# Patient Record
Sex: Female | Born: 1950
Health system: Southern US, Community
[De-identification: ages and names within clinical notes are randomized; demographics above are authoritative.]

## PROBLEM LIST (undated history)

## (undated) DIAGNOSIS — Z87442 Personal history of urinary calculi: Secondary | ICD-10-CM

## (undated) DIAGNOSIS — T8859XA Other complications of anesthesia, initial encounter: Secondary | ICD-10-CM

## (undated) DIAGNOSIS — N200 Calculus of kidney: Secondary | ICD-10-CM

## (undated) DIAGNOSIS — R002 Palpitations: Secondary | ICD-10-CM

## (undated) DIAGNOSIS — K862 Cyst of pancreas: Secondary | ICD-10-CM

## (undated) DIAGNOSIS — C801 Malignant (primary) neoplasm, unspecified: Secondary | ICD-10-CM

## (undated) DIAGNOSIS — F419 Anxiety disorder, unspecified: Secondary | ICD-10-CM

## (undated) DIAGNOSIS — K219 Gastro-esophageal reflux disease without esophagitis: Secondary | ICD-10-CM

## (undated) DIAGNOSIS — T4145XA Adverse effect of unspecified anesthetic, initial encounter: Secondary | ICD-10-CM

## (undated) DIAGNOSIS — E119 Type 2 diabetes mellitus without complications: Secondary | ICD-10-CM

## (undated) DIAGNOSIS — M1990 Unspecified osteoarthritis, unspecified site: Secondary | ICD-10-CM

## (undated) HISTORY — PX: CARDIAC CATHETERIZATION: SHX172

## (undated) HISTORY — PX: EYE SURGERY: SHX253

## (undated) HISTORY — PX: TOTAL ABDOMINAL HYSTERECTOMY W/ BILATERAL SALPINGOOPHORECTOMY: SHX83

## (undated) HISTORY — DX: Cyst of pancreas: K86.2

## (undated) HISTORY — DX: Calculus of kidney: N20.0

## (undated) HISTORY — DX: Type 2 diabetes mellitus without complications: E11.9

## (undated) HISTORY — PX: CORONARY ANGIOPLASTY: SHX604

## (undated) HISTORY — PX: ABDOMINAL HYSTERECTOMY: SHX81

---
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
## 2000-10-29 ENCOUNTER — Other Ambulatory Visit: Admission: RE | Admit: 2000-10-29 | Discharge: 2000-10-29 | Payer: Self-pay | Admitting: Family Medicine

## 2004-11-23 ENCOUNTER — Ambulatory Visit: Payer: Self-pay | Admitting: Gastroenterology

## 2006-04-05 ENCOUNTER — Ambulatory Visit: Payer: Self-pay | Admitting: Family Medicine

## 2008-02-09 ENCOUNTER — Emergency Department: Payer: Self-pay | Admitting: Emergency Medicine

## 2009-01-06 ENCOUNTER — Ambulatory Visit: Payer: Self-pay | Admitting: Urology

## 2010-01-18 ENCOUNTER — Ambulatory Visit: Payer: Self-pay | Admitting: Urology

## 2010-02-06 ENCOUNTER — Ambulatory Visit: Payer: Self-pay | Admitting: Unknown Physician Specialty

## 2010-02-09 ENCOUNTER — Ambulatory Visit: Payer: Self-pay | Admitting: Unknown Physician Specialty

## 2011-05-11 ENCOUNTER — Ambulatory Visit: Payer: Self-pay | Admitting: Urology

## 2011-10-22 ENCOUNTER — Ambulatory Visit: Payer: Self-pay | Admitting: Family Medicine

## 2013-08-21 ENCOUNTER — Ambulatory Visit: Payer: Self-pay | Admitting: Internal Medicine

## 2014-07-05 DIAGNOSIS — F411 Generalized anxiety disorder: Secondary | ICD-10-CM | POA: Insufficient documentation

## 2014-07-20 ENCOUNTER — Ambulatory Visit: Payer: Self-pay | Admitting: Internal Medicine

## 2014-08-13 ENCOUNTER — Ambulatory Visit
Admit: 2014-08-13 | Disposition: A | Discharge status: Home / Self Care | Payer: Self-pay | Attending: Ophthalmology | Admitting: Ophthalmology

## 2014-08-17 ENCOUNTER — Ambulatory Visit
Admit: 2014-08-17 | Disposition: A | Discharge status: Home / Self Care | Payer: Self-pay | Attending: Ophthalmology | Admitting: Ophthalmology

## 2014-08-22 NOTE — Op Note (Signed)
PATIENT NAME:  Tammy Guerra, Tammy Guerra MR#:  [id number] DATE OF BIRTH:  02/14/1951  DATE OF PROCEDURE:  08/17/2014  PREOPERATIVE DIAGNOSIS:  Nuclear sclerotic cataract of the right eye.   POSTOPERATIVE DIAGNOSIS:  Nuclear sclerotic cataract of the right eye.   OPERATIVE PROCEDURE:  Cataract extraction by phacoemulsification with implant of intraocular lens to right eye.   SURGEON:  Birder Robson, MD.   ANESTHESIA:  1. Managed anesthesia care.  2. Topical tetracaine drops followed by 2% Xylocaine jelly applied in the preoperative holding area.   COMPLICATIONS:  None.   TECHNIQUE:  Stop and chop.   DESCRIPTION OF PROCEDURE:  The patient was examined and consented in the preoperative holding area where the aforementioned topical anesthesia was applied to the right eye and then brought back to the Operating Room where the right eye was prepped and draped in the usual sterile ophthalmic fashion and a lid speculum was placed. A paracentesis was created with the side port blade and the anterior chamber was filled with viscoelastic. A near clear corneal incision was performed with the steel keratome. A continuous curvilinear capsulorrhexis was performed with a cystotome followed by the capsulorrhexis forceps. Hydrodissection and hydrodelineation were carried out with BSS on a blunt cannula. The lens was removed in a stop and chop technique and the remaining cortical material was removed with the irrigation-aspiration handpiece. The capsular bag was inflated with viscoelastic and the Tecnis ZCB00 23.5-diopter lens, serial number [id number] was placed in the capsular bag without complication. The remaining viscoelastic was removed from the eye with the irrigation-aspiration handpiece. The wounds were hydrated. The anterior chamber was flushed with Miostat and the eye was inflated to physiologic pressure. 0.1 mL of cefuroxime concentration 10 mg/mL was placed in the anterior chamber. The wounds were found to be  water tight. The eye was dressed with Vigamox. The patient was given protective glasses to wear throughout the day and a shield with which to sleep tonight. The patient was also given drops with which to begin a drop regimen today and will follow-up with me in one day.    ____________________________ Livingston Diones. Timmia Cogburn, MD wlp:JT D: 08/17/2014 20:58:00 ET T: 08/18/2014 09:13:42 ET JOB#: [id number]  cc: Tammy Mccollister L. Jamisen Duerson, MD, <Dictator> Livingston Diones Lilybeth Vien MD ELECTRONICALLY SIGNED 08/18/2014 12:05

## 2014-09-02 ENCOUNTER — Encounter: Payer: Self-pay | Admitting: *Deleted

## 2014-09-02 DIAGNOSIS — Z79899 Other long term (current) drug therapy: Secondary | ICD-10-CM | POA: Diagnosis not present

## 2014-09-02 DIAGNOSIS — Z9841 Cataract extraction status, right eye: Secondary | ICD-10-CM | POA: Diagnosis not present

## 2014-09-02 DIAGNOSIS — M1990 Unspecified osteoarthritis, unspecified site: Secondary | ICD-10-CM | POA: Diagnosis not present

## 2014-09-02 DIAGNOSIS — Z9862 Peripheral vascular angioplasty status: Secondary | ICD-10-CM | POA: Diagnosis not present

## 2014-09-02 DIAGNOSIS — Z9889 Other specified postprocedural states: Secondary | ICD-10-CM | POA: Diagnosis not present

## 2014-09-02 DIAGNOSIS — F17200 Nicotine dependence, unspecified, uncomplicated: Secondary | ICD-10-CM | POA: Diagnosis not present

## 2014-09-02 DIAGNOSIS — H2512 Age-related nuclear cataract, left eye: Secondary | ICD-10-CM | POA: Diagnosis not present

## 2014-09-02 DIAGNOSIS — Z85828 Personal history of other malignant neoplasm of skin: Secondary | ICD-10-CM | POA: Diagnosis not present

## 2014-09-02 DIAGNOSIS — F419 Anxiety disorder, unspecified: Secondary | ICD-10-CM | POA: Diagnosis not present

## 2014-09-02 DIAGNOSIS — H269 Unspecified cataract: Secondary | ICD-10-CM | POA: Diagnosis present

## 2014-09-07 ENCOUNTER — Encounter: Payer: Self-pay | Admitting: *Deleted

## 2014-09-07 ENCOUNTER — Ambulatory Visit
Admission: RE | Admit: 2014-09-07 | Discharge: 2014-09-07 | Disposition: A | Discharge status: Home / Self Care | Payer: 59 | Source: Ambulatory Visit | Attending: Ophthalmology | Admitting: Ophthalmology

## 2014-09-07 ENCOUNTER — Ambulatory Visit: Payer: 59 | Admitting: Anesthesiology

## 2014-09-07 ENCOUNTER — Encounter
Admission: RE | Disposition: A | Discharge status: Home / Self Care | Payer: Self-pay | Source: Ambulatory Visit | Attending: Ophthalmology

## 2014-09-07 DIAGNOSIS — Z79899 Other long term (current) drug therapy: Secondary | ICD-10-CM | POA: Insufficient documentation

## 2014-09-07 DIAGNOSIS — Z9841 Cataract extraction status, right eye: Secondary | ICD-10-CM | POA: Insufficient documentation

## 2014-09-07 DIAGNOSIS — H2512 Age-related nuclear cataract, left eye: Secondary | ICD-10-CM | POA: Insufficient documentation

## 2014-09-07 DIAGNOSIS — Z9862 Peripheral vascular angioplasty status: Secondary | ICD-10-CM | POA: Insufficient documentation

## 2014-09-07 DIAGNOSIS — M1990 Unspecified osteoarthritis, unspecified site: Secondary | ICD-10-CM | POA: Insufficient documentation

## 2014-09-07 DIAGNOSIS — F17200 Nicotine dependence, unspecified, uncomplicated: Secondary | ICD-10-CM | POA: Insufficient documentation

## 2014-09-07 DIAGNOSIS — F419 Anxiety disorder, unspecified: Secondary | ICD-10-CM | POA: Insufficient documentation

## 2014-09-07 DIAGNOSIS — Z85828 Personal history of other malignant neoplasm of skin: Secondary | ICD-10-CM | POA: Insufficient documentation

## 2014-09-07 DIAGNOSIS — Z9889 Other specified postprocedural states: Secondary | ICD-10-CM | POA: Insufficient documentation

## 2014-09-07 HISTORY — DX: Unspecified osteoarthritis, unspecified site: M19.90

## 2014-09-07 HISTORY — DX: Palpitations: R00.2

## 2014-09-07 HISTORY — PX: CATARACT EXTRACTION W/PHACO: SHX586

## 2014-09-07 HISTORY — DX: Other complications of anesthesia, initial encounter: T88.59XA

## 2014-09-07 HISTORY — DX: Gastro-esophageal reflux disease without esophagitis: K21.9

## 2014-09-07 HISTORY — DX: Anxiety disorder, unspecified: F41.9

## 2014-09-07 HISTORY — DX: Malignant (primary) neoplasm, unspecified: C80.1

## 2014-09-07 HISTORY — DX: Adverse effect of unspecified anesthetic, initial encounter: T41.45XA

## 2014-09-07 SURGERY — PHACOEMULSIFICATION, CATARACT, WITH IOL INSERTION
Anesthesia: Monitor Anesthesia Care | Laterality: Left

## 2014-09-07 MED ORDER — EPINEPHRINE HCL 1 MG/ML IJ SOLN
INTRAMUSCULAR | Status: AC
  Filled 2014-09-07: qty 2

## 2014-09-07 MED ORDER — NA CHONDROIT SULF-NA HYALURON 40-17 MG/ML IO SOLN
INTRAOCULAR | Status: AC
  Filled 2014-09-07: qty 1

## 2014-09-07 MED ORDER — ARMC OPHTHALMIC DILATING GEL
1.0000 "application " | OPHTHALMIC | Status: DC | PRN
  Administered 2014-09-07: 1 via OPHTHALMIC

## 2014-09-07 MED ORDER — LIDOCAINE HCL (PF) 4 % IJ SOLN
INTRAMUSCULAR | Status: AC
  Filled 2014-09-07: qty 5

## 2014-09-07 MED ORDER — CEFUROXIME OPHTHALMIC INJECTION 1 MG/0.1 ML
INJECTION | OPHTHALMIC | Status: DC | PRN
  Administered 2014-09-07: 0.1 mL via INTRACAMERAL

## 2014-09-07 MED ORDER — TETRACAINE HCL 0.5 % OP SOLN
1.0000 [drp] | OPHTHALMIC | Status: AC | PRN
  Administered 2014-09-07: 1 [drp] via OPHTHALMIC

## 2014-09-07 MED ORDER — MIDAZOLAM HCL 2 MG/2ML IJ SOLN
INTRAMUSCULAR | Status: DC | PRN
  Administered 2014-09-07: 2 mg via INTRAVENOUS

## 2014-09-07 MED ORDER — CEFUROXIME OPHTHALMIC INJECTION 1 MG/0.1 ML
INJECTION | OPHTHALMIC | Status: AC
  Filled 2014-09-07: qty 0.1

## 2014-09-07 MED ORDER — SODIUM CHLORIDE 0.9 % IV SOLN
INTRAVENOUS | Status: DC
  Administered 2014-09-07: 10:00:00 via INTRAVENOUS

## 2014-09-07 MED ORDER — MOXIFLOXACIN HCL 0.5 % OP SOLN
OPHTHALMIC | Status: AC
  Filled 2014-09-07: qty 3

## 2014-09-07 MED ORDER — POVIDONE-IODINE 5 % OP SOLN
OPHTHALMIC | Status: AC
  Administered 2014-09-07: 1 via OPHTHALMIC
  Filled 2014-09-07: qty 30

## 2014-09-07 MED ORDER — EPINEPHRINE HCL 1 MG/ML IJ SOLN
INTRAMUSCULAR | Status: DC | PRN
  Administered 2014-09-07: .4 mL via OPHTHALMIC

## 2014-09-07 MED ORDER — MOXIFLOXACIN HCL 0.5 % OP SOLN - NO CHARGE
OPHTHALMIC | Status: DC | PRN
Start: 2014-09-07 — End: 2014-09-07
  Administered 2014-09-07: 1 [drp] via OPHTHALMIC

## 2014-09-07 MED ORDER — POVIDONE-IODINE 5 % OP SOLN
1.0000 "application " | OPHTHALMIC | Status: AC | PRN
  Administered 2014-09-07: 1 via OPHTHALMIC

## 2014-09-07 MED ORDER — TETRACAINE HCL 0.5 % OP SOLN
OPHTHALMIC | Status: AC
  Administered 2014-09-07: 1 [drp] via OPHTHALMIC
  Filled 2014-09-07: qty 2

## 2014-09-07 MED ORDER — ARMC OPHTHALMIC DILATING GEL
OPHTHALMIC | Status: AC
  Filled 2014-09-07: qty 0.25

## 2014-09-07 MED ORDER — BSS IO SOLN
INTRAOCULAR | Status: DC | PRN
  Administered 2014-09-07: 200 mL

## 2014-09-07 MED ORDER — CARBACHOL 0.01 % IO SOLN
INTRAOCULAR | Status: DC | PRN
  Administered 2014-09-07: 0.5 mL via INTRAOCULAR

## 2014-09-07 SURGICAL SUPPLY — 24 items
ACTIVE FMS ×1 IMPLANT
CANNULA ANT/CHMB 27G (MISCELLANEOUS) ×1 IMPLANT
CANNULA ANT/CHMB 27GA (MISCELLANEOUS) ×2 IMPLANT
GLOVE BIO SURGEON STRL SZ8 (GLOVE) ×2 IMPLANT
GLOVE BIOGEL M 6.5 STRL (GLOVE) ×2 IMPLANT
GLOVE SURG LX 8.0 MICRO (GLOVE) ×1
GLOVE SURG LX STRL 8.0 MICRO (GLOVE) ×1 IMPLANT
GOWN STRL REUS W/ TWL LRG LVL3 (GOWN DISPOSABLE) ×2 IMPLANT
GOWN STRL REUS W/TWL LRG LVL3 (GOWN DISPOSABLE) ×4
LENS IOL TECNIS 23.0 (Intraocular Lens) ×2 IMPLANT
LENS IOL TECNIS MONO 1P 23.0 (Intraocular Lens) IMPLANT
PACK CATARACT (MISCELLANEOUS) ×2 IMPLANT
PACK CATARACT BRASINGTON LX (MISCELLANEOUS) ×2 IMPLANT
PACK EYE AFTER SURG (MISCELLANEOUS) ×2 IMPLANT
SOL BSS BAG (MISCELLANEOUS) ×2
SOL PREP PVP 2OZ (MISCELLANEOUS) ×2
SOLUTION BSS BAG (MISCELLANEOUS) ×1 IMPLANT
SOLUTION PREP PVP 2OZ (MISCELLANEOUS) ×1 IMPLANT
SYR 5ML LL (SYRINGE) ×2 IMPLANT
SYR TB 1ML 27GX1/2 LL (SYRINGE) ×2 IMPLANT
WATER STERILE IRR 1000ML POUR (IV SOLUTION) ×2 IMPLANT
WIPE NON LINTING 3.25X3.25 (MISCELLANEOUS) ×2 IMPLANT
ZCB0023.0 ×1 IMPLANT
zcb0023.0 ×1 IMPLANT

## 2014-09-07 NOTE — H&P (Signed)
  All labs reviewed. Abnormal studies sent to patients PCP when indicated.  Previous H&P reviewed, patient examined, there are NO CHANGES.  

## 2014-09-07 NOTE — Op Note (Signed)
PREOPERATIVE DIAGNOSIS:  Nuclear sclerotic cataract of the left eye.   POSTOPERATIVE DIAGNOSIS:  same   OPERATIVE PROCEDURE:  Procedure(s): CATARACT EXTRACTION PHACO AND INTRAOCULAR LENS PLACEMENT (IOC)   SURGEON:  Birder Robson, MD.   ANESTHESIA: 1.      Managed anesthesia care. 2.      Topical tetracaine drops followed by 2% Xylocaine jelly applied in the preoperative holding area.       3.      0.2 ml of epi-Shugarcaine was  placed in the anterior chamber following the paracentesis.    COMPLICATIONS:  None.   TECHNIQUE:   Stop and chop   DESCRIPTION OF PROCEDURE:  The patient was examined and consented in the preoperative holding area where the aforementioned topical anesthesia was applied to the left eye and then brought back to the Operating Room where the left eye was prepped and draped in the usual sterile ophthalmic fashion and a lid speculum was placed. A paracentesis was created with the side port blade and the anterior chamber was filled with viscoelastic. A near clear corneal incision was performed with the steel keratome. A continuous curvilinear capsulorrhexis was performed with a cystotome followed by the capsulorrhexis forceps. Hydrodissection and hydrodelineation were carried out with BSS on a blunt cannula. The lens was removed in a stop and chop  technique and the remaining cortical material was removed with the irrigation-aspiration handpiece. The capsular bag was inflated with viscoelastic and the Technis ZCB00 lens was placed in the capsular bag without complication. The remaining viscoelastic was removed from the eye with the irrigation-aspiration handpiece. The wounds were hydrated. The anterior chamber was flushed with Miostat and the eye was inflated to physiologic pressure. 0.1 mL of cefuroxime concentration 10 mg/mL was placed in the anterior chamber. The wounds were found to be water tight. The eye was dressed with Vigamox. The patient was given protective glasses  to wear throughout the day and a shield with which to sleep tonight. The patient was also given drops with which to begin a drop regimen today and will follow-up with me in one day.   Implant Name Type Inv. Item Serial No. Manufacturer Lot No. LRB No. Used  [id number]     [id number]     Left 1    Electronically signed: [place] 09/07/2014 10:50 AM

## 2014-09-07 NOTE — Discharge Instructions (Addendum)
See cataract post op handout  Eye Surgery Discharge Instructions  Expect mild scratchy sensation or mild soreness. DO NOT RUB YOUR EYE!  The day of surgery:  Minimal physical activity, but bed rest is not required  No reading, computer work, or close hand work  No bending, lifting, or straining.  May watch TV  For 24 hours:  No driving, legal decisions, or alcoholic beverages  Safety precautions  Eat anything you prefer: It is better to start with liquids, then soup then solid foods.  _____ Eye patch should be worn until postoperative exam tomorrow.  ____ Solar shield eyeglasses should be worn for comfort in the sunlight/patch while sleeping  Resume all regular medications including aspirin or Coumadin if these were discontinued prior to surgery. You may shower, bathe, shave, or wash your hair. Tylenol may be taken for mild discomfort.  Call your doctor if you experience significant pain, nausea, or vomiting, fever > 101 or other signs of infection. [phone] or [phone] Specific instructions:

## 2014-09-07 NOTE — Anesthesia Preprocedure Evaluation (Signed)
Anesthesia Evaluation  Patient identified by MRN, date of birth, ID band Patient awake    Reviewed: Allergy & Precautions, H&P , NPO status , Patient's Chart, lab work & pertinent test results, reviewed documented beta blocker date and time   History of Anesthesia Complications (+) AWARENESS UNDER ANESTHESIA and history of anesthetic complications  Airway Mallampati: II  TM Distance: >3 FB Neck ROM: full    Dental no notable dental hx.    Pulmonary neg pulmonary ROS, shortness of breath, Current Smoker,  breath sounds clear to auscultation  Pulmonary exam normal       Cardiovascular Exercise Tolerance: Good negative cardio ROS  Rhythm:regular Rate:Normal     Neuro/Psych PSYCHIATRIC DISORDERS Very anxiiousnegative neurological ROS  negative psych ROS   GI/Hepatic negative GI ROS, Neg liver ROS, GERD-  ,  Endo/Other  negative endocrine ROS  Renal/GU negative Renal ROS  negative genitourinary   Musculoskeletal   Abdominal   Peds  Hematology negative hematology ROS (+)   Anesthesia Other Findings   Reproductive/Obstetrics negative OB ROS                             Anesthesia Physical Anesthesia Plan  ASA: III  Anesthesia Plan: MAC   Post-op Pain Management:    Induction:   Airway Management Planned:   Additional Equipment:   Intra-op Plan:   Post-operative Plan:   Informed Consent: I have reviewed the patients History and Physical, chart, labs and discussed the procedure including the risks, benefits and alternatives for the proposed anesthesia with the patient or authorized representative who has indicated his/her understanding and acceptance.   Dental Advisory Given  Plan Discussed with: CRNA  Anesthesia Plan Comments:         Anesthesia Quick Evaluation

## 2014-09-07 NOTE — Anesthesia Postprocedure Evaluation (Signed)
  Anesthesia Post-op Note  Patient: Tammy Guerra  Procedure(s) Performed: Procedure(s) with comments: CATARACT EXTRACTION PHACO AND INTRAOCULAR LENS PLACEMENT (IOC) (Left) - [place] 01:03 AP% 27.7 CDE 17.55  Anesthesia type:MAC  Patient location: PACU  Post pain: Pain level controlled  Post assessment: Post-op Vital signs reviewed, Patient's Cardiovascular Status Stable, Respiratory Function Stable, Patent Airway and No signs of Nausea or vomiting  Post vital signs: Reviewed and stable  Last Vitals:  Filed Vitals:   09/07/14 1053  BP: 140/79  Pulse: 53  Temp: 36.8 C  Resp:     Level of consciousness: awake, alert  and patient cooperative  Complications: No apparent anesthesia complications

## 2014-09-07 NOTE — Transfer of Care (Signed)
Immediate Anesthesia Transfer of Care Note  Patient: Tammy Guerra  Procedure(s) Performed: Procedure(s) with comments: CATARACT EXTRACTION PHACO AND INTRAOCULAR LENS PLACEMENT (IOC) (Left) - [place] 01:03 AP% 27.7 CDE 17.55  Patient Location: PACU  Anesthesia Type:MAC  Level of Consciousness: awake, alert  and oriented  Airway & Oxygen Therapy: Patient Spontanous Breathing  Post-op Assessment: Report given to RN, Post -op Vital signs reviewed and stable and Patient moving all extremities X 4  Post vital signs: Reviewed and stable  Last Vitals:  Filed Vitals:   09/07/14 1053  BP: 140/79  Pulse: 53  Temp: 36.8 C  Resp:     Complications: No apparent anesthesia complications

## 2014-09-08 ENCOUNTER — Encounter: Payer: Self-pay | Admitting: Ophthalmology

## 2014-10-05 DIAGNOSIS — N200 Calculus of kidney: Secondary | ICD-10-CM | POA: Insufficient documentation

## 2015-01-05 DIAGNOSIS — Z0000 Encounter for general adult medical examination without abnormal findings: Secondary | ICD-10-CM | POA: Insufficient documentation

## 2016-04-25 DIAGNOSIS — M545 Low back pain: Secondary | ICD-10-CM | POA: Insufficient documentation

## 2016-04-25 DIAGNOSIS — M79604 Pain in right leg: Secondary | ICD-10-CM | POA: Insufficient documentation

## 2016-04-26 ENCOUNTER — Other Ambulatory Visit: Payer: Self-pay | Admitting: Unknown Physician Specialty

## 2016-04-26 DIAGNOSIS — M545 Low back pain: Secondary | ICD-10-CM

## 2016-04-26 DIAGNOSIS — M79605 Pain in left leg: Secondary | ICD-10-CM

## 2016-05-17 ENCOUNTER — Ambulatory Visit
Admission: RE | Admit: 2016-05-17 | Discharge: 2016-05-17 | Disposition: A | Discharge status: Home / Self Care | Payer: BLUE CROSS/BLUE SHIELD | Source: Ambulatory Visit | Attending: Unknown Physician Specialty | Admitting: Unknown Physician Specialty

## 2016-05-17 ENCOUNTER — Encounter: Payer: Self-pay | Admitting: Radiology

## 2016-05-17 DIAGNOSIS — M5136 Other intervertebral disc degeneration, lumbar region: Secondary | ICD-10-CM | POA: Diagnosis not present

## 2016-05-17 DIAGNOSIS — M79605 Pain in left leg: Secondary | ICD-10-CM | POA: Insufficient documentation

## 2016-05-17 DIAGNOSIS — M79604 Pain in right leg: Secondary | ICD-10-CM | POA: Insufficient documentation

## 2016-05-17 DIAGNOSIS — M47896 Other spondylosis, lumbar region: Secondary | ICD-10-CM | POA: Insufficient documentation

## 2016-05-17 DIAGNOSIS — M545 Low back pain: Secondary | ICD-10-CM

## 2016-05-17 DIAGNOSIS — M48061 Spinal stenosis, lumbar region without neurogenic claudication: Secondary | ICD-10-CM | POA: Diagnosis not present

## 2016-05-28 ENCOUNTER — Encounter (INDEPENDENT_AMBULATORY_CARE_PROVIDER_SITE_OTHER): Payer: BLUE CROSS/BLUE SHIELD | Admitting: Vascular Surgery

## 2016-05-29 ENCOUNTER — Other Ambulatory Visit: Payer: Self-pay | Admitting: Unknown Physician Specialty

## 2016-05-29 DIAGNOSIS — M23306 Other meniscus derangements, unspecified meniscus, right knee: Secondary | ICD-10-CM

## 2016-06-06 ENCOUNTER — Telehealth: Payer: Self-pay | Admitting: Unknown Physician Specialty

## 2016-06-07 ENCOUNTER — Ambulatory Visit
Admission: RE | Admit: 2016-06-07 | Discharge: 2016-06-07 | Disposition: A | Discharge status: Home / Self Care | Payer: BLUE CROSS/BLUE SHIELD | Source: Ambulatory Visit | Attending: Unknown Physician Specialty | Admitting: Unknown Physician Specialty

## 2017-05-06 ENCOUNTER — Other Ambulatory Visit: Payer: Self-pay | Admitting: Orthopedic Surgery

## 2017-05-06 DIAGNOSIS — M23306 Other meniscus derangements, unspecified meniscus, right knee: Secondary | ICD-10-CM

## 2017-05-13 ENCOUNTER — Ambulatory Visit
Admission: RE | Admit: 2017-05-13 | Discharge: 2017-05-13 | Disposition: A | Discharge status: Home / Self Care | Payer: Medicare HMO | Source: Ambulatory Visit | Attending: Orthopedic Surgery | Admitting: Orthopedic Surgery

## 2017-05-13 DIAGNOSIS — X58XXXA Exposure to other specified factors, initial encounter: Secondary | ICD-10-CM | POA: Insufficient documentation

## 2017-05-13 DIAGNOSIS — S83271A Complex tear of lateral meniscus, current injury, right knee, initial encounter: Secondary | ICD-10-CM | POA: Diagnosis not present

## 2017-05-13 DIAGNOSIS — M25461 Effusion, right knee: Secondary | ICD-10-CM | POA: Diagnosis not present

## 2017-05-13 DIAGNOSIS — M23306 Other meniscus derangements, unspecified meniscus, right knee: Secondary | ICD-10-CM

## 2017-05-13 DIAGNOSIS — D1723 Benign lipomatous neoplasm of skin and subcutaneous tissue of right leg: Secondary | ICD-10-CM | POA: Diagnosis not present

## 2017-05-13 DIAGNOSIS — M1711 Unilateral primary osteoarthritis, right knee: Secondary | ICD-10-CM | POA: Diagnosis not present

## 2017-05-23 ENCOUNTER — Encounter
Admission: RE | Admit: 2017-05-23 | Discharge: 2017-05-23 | Disposition: A | Discharge status: Home / Self Care | Payer: Medicare HMO | Source: Ambulatory Visit | Attending: Orthopedic Surgery | Admitting: Orthopedic Surgery

## 2017-05-23 ENCOUNTER — Other Ambulatory Visit: Payer: Self-pay

## 2017-05-23 HISTORY — DX: Personal history of urinary calculi: Z87.442

## 2017-05-23 NOTE — Patient Instructions (Addendum)
  Your procedure is scheduled on: 05-30-17 THURSDAY Report to Same Day Surgery 2nd floor medical mall [facility] Entrance-take elevator on left to 2nd floor.  Check in with surgery information desk.) To find out your arrival time please call [phone] between 1PM - 3PM on 05-29-17 [facility]  Remember: Instructions that are not followed completely may result in serious medical risk, up to and including death, or upon the discretion of your surgeon and anesthesiologist your surgery may need to be rescheduled.    _x___ 1. Do not eat food after midnight the night before your procedure. NO GUM OR CANDY AFTER MIDNIGHT.  You may drink clear liquids up to 2 hours before you are scheduled to arrive at the hospital for your procedure.  Do not drink clear liquids within 2 hours of your scheduled arrival to the hospital.  Clear liquids include  --Water or Apple juice without pulp  --Clear carbohydrate beverage such as ClearFast or Gatorade  --Black Coffee or Clear Tea (No milk, no creamers, do not add anything to the coffee or Tea     __x__ 2. No Alcohol for 24 hours before or after surgery.   __x__3. No Smoking for 24 prior to surgery.   ____  4. Bring all medications with you on the day of surgery if instructed.    __x__ 5. Notify your doctor if there is any change in your medical condition     (cold, fever, infections).     Do not wear jewelry, make-up, hairpins, clips or nail polish.  Do not wear lotions, powders, or perfumes. You may wear deodorant.  Do not shave 48 hours prior to surgery. Men may shave face and neck.  Do not bring valuables to the hospital.    [facility] is not responsible for any belongings or valuables.               Contacts, dentures or bridgework may not be worn into surgery.  Leave your suitcase in the car. After surgery it may be brought to your room.  For patients admitted to the hospital, discharge time is determined by your treatment team.   Patients  discharged the day of surgery will not be allowed to drive home.  You will need someone to drive you home and stay with you the night of your procedure.    Please read over the following fact sheets that you were given:   [facility] Preparing for Surgery and or MRSA Information   _x___ TAKE THE  FOLLOWING MEDICATION THE MORNING OF SURGERY WITH A SMALL SIP OF WATER. These include:  1. CYMBALTA  2.  3.  4.  5.  6.  ____Fleets enema or Magnesium Citrate as directed.   _x___ Use CHG Soap or sage wipes as directed on instruction sheet   ____ Use inhalers on the day of surgery and bring to hospital day of surgery  ____ Stop Metformin and Janumet 2 days prior to surgery.    ____ Take 1/2 of usual insulin dose the night before surgery and none on the morning surgery.   ____ Follow recommendations from Cardiologist, Pulmonologist or PCP regarding stopping Aspirin, Coumadin, Plavix ,Eliquis, Effient, or Pradaxa, and Pletal.  X____Stop Anti-inflammatories such as Advil, Aleve, Ibuprofen, Motrin, Naproxen, Naprosyn, Goodies powders or aspirin products NOW-OK to take Tylenol    ____ Stop supplements until after surgery.    ____ Bring C-Pap to the hospital.

## 2017-05-27 ENCOUNTER — Encounter
Admission: RE | Admit: 2017-05-27 | Discharge: 2017-05-27 | Disposition: A | Discharge status: Home / Self Care | Payer: Medicare HMO | Source: Ambulatory Visit | Attending: Orthopedic Surgery | Admitting: Orthopedic Surgery

## 2017-05-27 DIAGNOSIS — F17200 Nicotine dependence, unspecified, uncomplicated: Secondary | ICD-10-CM | POA: Diagnosis not present

## 2017-05-27 DIAGNOSIS — M6751 Plica syndrome, right knee: Secondary | ICD-10-CM | POA: Diagnosis not present

## 2017-05-27 DIAGNOSIS — E669 Obesity, unspecified: Secondary | ICD-10-CM | POA: Diagnosis not present

## 2017-05-27 DIAGNOSIS — Z85828 Personal history of other malignant neoplasm of skin: Secondary | ICD-10-CM | POA: Diagnosis not present

## 2017-05-27 DIAGNOSIS — M1712 Unilateral primary osteoarthritis, left knee: Secondary | ICD-10-CM | POA: Diagnosis not present

## 2017-05-27 DIAGNOSIS — M65861 Other synovitis and tenosynovitis, right lower leg: Secondary | ICD-10-CM | POA: Diagnosis not present

## 2017-05-27 DIAGNOSIS — Z8249 Family history of ischemic heart disease and other diseases of the circulatory system: Secondary | ICD-10-CM | POA: Diagnosis not present

## 2017-05-27 DIAGNOSIS — K219 Gastro-esophageal reflux disease without esophagitis: Secondary | ICD-10-CM | POA: Diagnosis not present

## 2017-05-27 DIAGNOSIS — M2241 Chondromalacia patellae, right knee: Secondary | ICD-10-CM | POA: Diagnosis not present

## 2017-05-27 DIAGNOSIS — Z6830 Body mass index (BMI) 30.0-30.9, adult: Secondary | ICD-10-CM | POA: Diagnosis not present

## 2017-05-27 DIAGNOSIS — M23221 Derangement of posterior horn of medial meniscus due to old tear or injury, right knee: Secondary | ICD-10-CM | POA: Diagnosis present

## 2017-05-27 DIAGNOSIS — F419 Anxiety disorder, unspecified: Secondary | ICD-10-CM | POA: Diagnosis not present

## 2017-05-27 DIAGNOSIS — Z79899 Other long term (current) drug therapy: Secondary | ICD-10-CM | POA: Diagnosis not present

## 2017-05-27 DIAGNOSIS — M23251 Derangement of posterior horn of lateral meniscus due to old tear or injury, right knee: Secondary | ICD-10-CM | POA: Diagnosis not present

## 2017-05-27 LAB — BASIC METABOLIC PANEL
Anion gap: 10 (ref 5–15)
BUN: 12 mg/dL (ref 6–20)
CALCIUM: 9.4 mg/dL (ref 8.9–10.3)
CO2: 23 mmol/L (ref 22–32)
CREATININE: 0.89 mg/dL (ref 0.44–1.00)
Chloride: 102 mmol/L (ref 101–111)
GFR calc non Af Amer: >60 mL/min (ref >60)
Glucose, Bld: 98 mg/dL (ref 65–99)
Potassium: 4 mmol/L (ref 3.5–5.1)
SODIUM: 135 mmol/L (ref 135–145)

## 2017-05-29 MED ORDER — CEFAZOLIN SODIUM-DEXTROSE 2-4 GM/100ML-% IV SOLN
2.0000 g | Freq: Once | INTRAVENOUS | Status: AC
  Administered 2017-05-30: 2 g via INTRAVENOUS

## 2017-05-30 ENCOUNTER — Encounter
Admission: RE | Disposition: A | Discharge status: Home / Self Care | Payer: Self-pay | Source: Ambulatory Visit | Attending: Orthopedic Surgery

## 2017-05-30 ENCOUNTER — Ambulatory Visit
Admission: RE | Admit: 2017-05-30 | Discharge: 2017-05-30 | Disposition: A | Discharge status: Home / Self Care | Payer: Medicare HMO | Source: Ambulatory Visit | Attending: Orthopedic Surgery | Admitting: Orthopedic Surgery

## 2017-05-30 ENCOUNTER — Ambulatory Visit: Payer: Medicare HMO | Admitting: Anesthesiology

## 2017-05-30 ENCOUNTER — Encounter: Payer: Self-pay | Admitting: Emergency Medicine

## 2017-05-30 DIAGNOSIS — M2241 Chondromalacia patellae, right knee: Secondary | ICD-10-CM | POA: Insufficient documentation

## 2017-05-30 DIAGNOSIS — Z79899 Other long term (current) drug therapy: Secondary | ICD-10-CM | POA: Insufficient documentation

## 2017-05-30 DIAGNOSIS — F419 Anxiety disorder, unspecified: Secondary | ICD-10-CM | POA: Insufficient documentation

## 2017-05-30 DIAGNOSIS — M23251 Derangement of posterior horn of lateral meniscus due to old tear or injury, right knee: Secondary | ICD-10-CM | POA: Insufficient documentation

## 2017-05-30 DIAGNOSIS — M6751 Plica syndrome, right knee: Secondary | ICD-10-CM | POA: Insufficient documentation

## 2017-05-30 DIAGNOSIS — F17200 Nicotine dependence, unspecified, uncomplicated: Secondary | ICD-10-CM | POA: Insufficient documentation

## 2017-05-30 DIAGNOSIS — Z85828 Personal history of other malignant neoplasm of skin: Secondary | ICD-10-CM | POA: Insufficient documentation

## 2017-05-30 DIAGNOSIS — M65861 Other synovitis and tenosynovitis, right lower leg: Secondary | ICD-10-CM | POA: Insufficient documentation

## 2017-05-30 DIAGNOSIS — K219 Gastro-esophageal reflux disease without esophagitis: Secondary | ICD-10-CM | POA: Insufficient documentation

## 2017-05-30 DIAGNOSIS — M23221 Derangement of posterior horn of medial meniscus due to old tear or injury, right knee: Secondary | ICD-10-CM | POA: Diagnosis not present

## 2017-05-30 DIAGNOSIS — Z8249 Family history of ischemic heart disease and other diseases of the circulatory system: Secondary | ICD-10-CM | POA: Insufficient documentation

## 2017-05-30 DIAGNOSIS — M1712 Unilateral primary osteoarthritis, left knee: Secondary | ICD-10-CM | POA: Insufficient documentation

## 2017-05-30 DIAGNOSIS — Z6830 Body mass index (BMI) 30.0-30.9, adult: Secondary | ICD-10-CM | POA: Insufficient documentation

## 2017-05-30 DIAGNOSIS — E669 Obesity, unspecified: Secondary | ICD-10-CM | POA: Insufficient documentation

## 2017-05-30 HISTORY — PX: SYNOVECTOMY: SHX5180

## 2017-05-30 HISTORY — PX: KNEE ARTHROSCOPY WITH MEDIAL MENISECTOMY: SHX5651

## 2017-05-30 SURGERY — ARTHROSCOPY, KNEE, WITH MEDIAL MENISCECTOMY
Anesthesia: General | Site: Knee | Laterality: Right | Wound class: Clean

## 2017-05-30 MED ORDER — FENTANYL CITRATE (PF) 100 MCG/2ML IJ SOLN
INTRAMUSCULAR | Status: AC
  Administered 2017-05-30: 25 ug via INTRAVENOUS
  Filled 2017-05-30: qty 2

## 2017-05-30 MED ORDER — OXYCODONE HCL 5 MG PO TABS: 5.0000 mg | ORAL_TABLET | Freq: Once | ORAL | Status: DC | PRN

## 2017-05-30 MED ORDER — LACTATED RINGERS IV SOLN
INTRAVENOUS | Status: DC
  Administered 2017-05-30: 07:00:00 via INTRAVENOUS

## 2017-05-30 MED ORDER — MEPERIDINE HCL 50 MG/ML IJ SOLN: 6.2500 mg | INTRAMUSCULAR | Status: DC | PRN

## 2017-05-30 MED ORDER — GLYCOPYRROLATE 0.2 MG/ML IJ SOLN
INTRAMUSCULAR | Status: DC | PRN
  Administered 2017-05-30: 0.2 mg via INTRAVENOUS

## 2017-05-30 MED ORDER — ONDANSETRON HCL 4 MG/2ML IJ SOLN: 4.0000 mg | Freq: Four times a day (QID) | INTRAMUSCULAR | Status: DC | PRN

## 2017-05-30 MED ORDER — ONDANSETRON HCL 4 MG PO TABS: 4.0000 mg | ORAL_TABLET | Freq: Four times a day (QID) | ORAL | Status: DC | PRN

## 2017-05-30 MED ORDER — METOCLOPRAMIDE HCL 5 MG/ML IJ SOLN: 5.0000 mg | Freq: Three times a day (TID) | INTRAMUSCULAR | Status: DC | PRN

## 2017-05-30 MED ORDER — HYDROCODONE-ACETAMINOPHEN 5-325 MG PO TABS
ORAL_TABLET | ORAL | Status: AC
  Filled 2017-05-30: qty 1

## 2017-05-30 MED ORDER — CEFAZOLIN SODIUM-DEXTROSE 2-4 GM/100ML-% IV SOLN
INTRAVENOUS | Status: AC
  Filled 2017-05-30: qty 100

## 2017-05-30 MED ORDER — KETOROLAC TROMETHAMINE 30 MG/ML IJ SOLN
INTRAMUSCULAR | Status: DC | PRN
  Administered 2017-05-30: 30 mg via INTRAVENOUS

## 2017-05-30 MED ORDER — ONDANSETRON HCL 4 MG/2ML IJ SOLN
INTRAMUSCULAR | Status: DC | PRN
  Administered 2017-05-30: 4 mg via INTRAVENOUS

## 2017-05-30 MED ORDER — SODIUM CHLORIDE 0.9 % IV SOLN: INTRAVENOUS | Status: DC

## 2017-05-30 MED ORDER — BUPIVACAINE-EPINEPHRINE (PF) 0.5% -1:200000 IJ SOLN
INTRAMUSCULAR | Status: DC | PRN
  Administered 2017-05-30: 20 mL

## 2017-05-30 MED ORDER — HYDROCODONE-ACETAMINOPHEN 5-325 MG PO TABS
1.0000 | ORAL_TABLET | ORAL | Status: DC | PRN
Start: 2017-05-30 — End: 2017-05-30
  Administered 2017-05-30: 1 via ORAL

## 2017-05-30 MED ORDER — FENTANYL CITRATE (PF) 100 MCG/2ML IJ SOLN
INTRAMUSCULAR | Status: DC | PRN
  Administered 2017-05-30: 50 ug via INTRAVENOUS
  Administered 2017-05-30: 25 ug via INTRAVENOUS

## 2017-05-30 MED ORDER — FAMOTIDINE 20 MG PO TABS
ORAL_TABLET | ORAL | Status: AC
  Administered 2017-05-30: 20 mg via ORAL
  Filled 2017-05-30: qty 1

## 2017-05-30 MED ORDER — OXYCODONE HCL 5 MG/5ML PO SOLN: 5.0000 mg | Freq: Once | ORAL | Status: DC | PRN

## 2017-05-30 MED ORDER — PROPOFOL 10 MG/ML IV BOLUS
INTRAVENOUS | Status: AC
  Filled 2017-05-30: qty 20

## 2017-05-30 MED ORDER — MIDAZOLAM HCL 2 MG/2ML IJ SOLN
INTRAMUSCULAR | Status: AC
  Filled 2017-05-30: qty 2

## 2017-05-30 MED ORDER — PROPOFOL 10 MG/ML IV BOLUS
INTRAVENOUS | Status: DC | PRN
  Administered 2017-05-30: 150 mg via INTRAVENOUS

## 2017-05-30 MED ORDER — METOCLOPRAMIDE HCL 10 MG PO TABS: 5.0000 mg | ORAL_TABLET | Freq: Three times a day (TID) | ORAL | Status: DC | PRN

## 2017-05-30 MED ORDER — HYDROCODONE-ACETAMINOPHEN 5-325 MG PO TABS: 1.0000 | ORAL_TABLET | Freq: Four times a day (QID) | ORAL | 0 refills | Status: DC | PRN

## 2017-05-30 MED ORDER — LIDOCAINE HCL (CARDIAC) 20 MG/ML IV SOLN
INTRAVENOUS | Status: DC | PRN
  Administered 2017-05-30: 100 mg via INTRAVENOUS

## 2017-05-30 MED ORDER — PROMETHAZINE HCL 25 MG/ML IJ SOLN: 6.2500 mg | INTRAMUSCULAR | Status: DC | PRN

## 2017-05-30 MED ORDER — FENTANYL CITRATE (PF) 100 MCG/2ML IJ SOLN
25.0000 ug | INTRAMUSCULAR | Status: DC | PRN
  Administered 2017-05-30: 25 ug via INTRAVENOUS
  Administered 2017-05-30: 50 ug via INTRAVENOUS
  Administered 2017-05-30: 25 ug via INTRAVENOUS

## 2017-05-30 MED ORDER — BUPIVACAINE-EPINEPHRINE (PF) 0.5% -1:200000 IJ SOLN
INTRAMUSCULAR | Status: AC
  Filled 2017-05-30: qty 30

## 2017-05-30 MED ORDER — ACETAMINOPHEN 10 MG/ML IV SOLN
INTRAVENOUS | Status: AC
Start: 2017-05-30 — End: 2017-05-30
  Filled 2017-05-30: qty 100

## 2017-05-30 MED ORDER — MIDAZOLAM HCL 2 MG/2ML IJ SOLN
INTRAMUSCULAR | Status: DC | PRN
  Administered 2017-05-30: 2 mg via INTRAVENOUS

## 2017-05-30 MED ORDER — ACETAMINOPHEN 10 MG/ML IV SOLN
INTRAVENOUS | Status: DC | PRN
  Administered 2017-05-30: 1000 mg via INTRAVENOUS

## 2017-05-30 MED ORDER — DEXAMETHASONE SODIUM PHOSPHATE 10 MG/ML IJ SOLN
INTRAMUSCULAR | Status: DC | PRN
  Administered 2017-05-30: 10 mg via INTRAVENOUS

## 2017-05-30 MED ORDER — FENTANYL CITRATE (PF) 100 MCG/2ML IJ SOLN
INTRAMUSCULAR | Status: AC
  Filled 2017-05-30: qty 2

## 2017-05-30 MED ORDER — FAMOTIDINE 20 MG PO TABS
20.0000 mg | ORAL_TABLET | Freq: Once | ORAL | Status: AC
  Administered 2017-05-30: 20 mg via ORAL

## 2017-05-30 SURGICAL SUPPLY — 24 items
BANDAGE ACE 4X5 VEL STRL LF (GAUZE/BANDAGES/DRESSINGS) IMPLANT
BLADE INCISOR PLUS 4.5 (BLADE) IMPLANT
CHLORAPREP W/TINT 26ML (MISCELLANEOUS) ×2 IMPLANT
CUFF TOURN 24 STER (MISCELLANEOUS) ×1 IMPLANT
CUFF TOURN 30 STER DUAL PORT (MISCELLANEOUS) IMPLANT
GAUZE SPONGE 4X4 12PLY STRL (GAUZE/BANDAGES/DRESSINGS) ×2 IMPLANT
GLOVE SURG SYN 9.0  PF PI (GLOVE) ×1
GLOVE SURG SYN 9.0 PF PI (GLOVE) ×1 IMPLANT
GOWN SRG 2XL LVL 4 RGLN SLV (GOWNS) ×1 IMPLANT
GOWN STRL NON-REIN 2XL LVL4 (GOWNS) ×2
GOWN STRL REUS W/ TWL LRG LVL3 (GOWN DISPOSABLE) ×2 IMPLANT
GOWN STRL REUS W/TWL LRG LVL3 (GOWN DISPOSABLE) ×4
IV LACTATED RINGER IRRG 3000ML (IV SOLUTION) ×12
IV LR IRRIG 3000ML ARTHROMATIC (IV SOLUTION) ×2 IMPLANT
KIT TURNOVER KIT A (KITS) ×2 IMPLANT
MANIFOLD NEPTUNE II (INSTRUMENTS) ×2 IMPLANT
PACK ARTHROSCOPY KNEE (MISCELLANEOUS) ×2 IMPLANT
SET TUBE SUCT SHAVER OUTFL 24K (TUBING) ×2 IMPLANT
SET TUBE TIP INTRA-ARTICULAR (MISCELLANEOUS) ×2 IMPLANT
SUT ETHILON 4-0 (SUTURE) ×2
SUT ETHILON 4-0 FS2 18XMFL BLK (SUTURE) ×1
SUTURE ETHLN 4-0 FS2 18XMF BLK (SUTURE) ×1 IMPLANT
TUBING ARTHRO INFLOW-ONLY STRL (TUBING) ×2 IMPLANT
WAND COBLATION FLOW 50 (SURGICAL WAND) ×2 IMPLANT

## 2017-05-30 NOTE — Anesthesia Postprocedure Evaluation (Signed)
Anesthesia Post Note  Patient: Rabecca Birge Lalor  Procedure(s) Performed: KNEE ARTHROSCOPY WITH MEDIAL AND LATERAL  MENISECTOMY (Right Knee) SYNOVECTOMY (Right Knee)  Patient location during evaluation: PACU Anesthesia Type: General Level of consciousness: awake and alert and oriented Pain management: pain level controlled Vital Signs Assessment: post-procedure vital signs reviewed and stable Respiratory status: spontaneous breathing, nonlabored ventilation and respiratory function stable Cardiovascular status: blood pressure returned to baseline and stable Postop Assessment: no signs of nausea or vomiting Anesthetic complications: no     Last Vitals:  Vitals:   05/30/17 0914 05/30/17 0926  BP: 134/77 (!) 151/62  Pulse: (!) 58 (!) 56  Resp: 14 16  Temp: 36.6 C 36.5 C  SpO2: 98% 98%    Last Pain:  Vitals:   05/30/17 0926  TempSrc: Oral  PainSc: 5                  Kalel Harty

## 2017-05-30 NOTE — Anesthesia Procedure Notes (Signed)
Procedure Name: LMA Insertion Date/Time: 05/30/2017 8:07 AM Performed by: Nelda Marseille, CRNA Pre-anesthesia Checklist: Patient identified, Patient being monitored, Timeout performed, Emergency Drugs available and Suction available Patient Re-evaluated:Patient Re-evaluated prior to induction Oxygen Delivery Method: Circle system utilized Preoxygenation: Pre-oxygenation with 100% oxygen Induction Type: IV induction Ventilation: Mask ventilation without difficulty LMA: LMA inserted LMA Size: 4.0 Tube type: Oral Number of attempts: 1 Placement Confirmation: positive ETCO2 and breath sounds checked- equal and bilateral Tube secured with: Tape Dental Injury: Teeth and Oropharynx as per pre-operative assessment

## 2017-05-30 NOTE — Discharge Instructions (Addendum)
Weightbearing as tolerated but try to minimize activities through the weekend. Keep bandage in place but if it slides down her leg remove entire bandage, apply one Band-Aid over each incision and reapply the Ace wrap only. Aspirin 325 mg daily until walking normally    AMBULATORY SURGERY  DISCHARGE INSTRUCTIONS   1) The drugs that you were given will stay in your system until tomorrow so for the next 24 hours you should not:  A) Drive an automobile B) Make any legal decisions C) Drink any alcoholic beverage   2) You may resume regular meals tomorrow.  Today it is better to start with liquids and gradually work up to solid foods.  You may eat anything you prefer, but it is better to start with liquids, then soup and crackers, and gradually work up to solid foods.   3) Please notify your doctor immediately if you have any unusual bleeding, trouble breathing, redness and pain at the surgery site, drainage, fever, or pain not relieved by medication.    4) Additional Instructions:        Please contact your physician with any problems or Same Day Surgery at [phone], Monday through Friday 6 am to 4 pm, or [place] at [facility] number at [phone].

## 2017-05-30 NOTE — Op Note (Signed)
05/30/2017  8:25 AM  PATIENT:  Tammy Guerra  67 y.o. female  PRE-OPERATIVE DIAGNOSIS:  OTHER MENISCUS DERANGEMENT, MENISCUS RIGHT KNEE  POST-OPERATIVE DIAGNOSIS:  LATERAL AND MEDIAL MENISCUS, SYNOVITIS  PROCEDURE:  Procedure(s): KNEE ARTHROSCOPY WITH MEDIAL AND LATERAL  MENISECTOMY (Right) SYNOVECTOMY (Right)  SURGEON: Laurene Footman, MD  ASSISTANTS: None  ANESTHESIA:   general  EBL:  Total I/O In: 700 [I.V.:700] Out: 5 [Blood:5]  BLOOD ADMINISTERED:none  DRAINS: none   LOCAL MEDICATIONS USED:  MARCAINE     SPECIMEN:  No Specimen  DISPOSITION OF SPECIMEN:  N/A  COUNTS:  YES  TOURNIQUET:  * Missing tourniquet times found for documented tourniquets in log: [id number] *  IMPLANTS: None  DICTATION: .Dragon Dictation patient was brought the operating room and after adequate general anesthesia was obtained the right leg was prepped and draped in sterile fashion with an arthroscopic leg holder and tourniquet applied. After prepping and draping in usual sterile manner appropriate patient identification and timeout procedures were completed. An inferior lateral portal was made and the scope introduced. Inspection inspection revealed extensive synovitis in the suprapatellar pouch. There is mild patellofemoral chondromalacia on both trochlea and patella this was central on the patella. There is a thick plica band medially as well as a thickened plica band laterally at impinging on the patellofemoral joint. [place]ral medial compartment there is extensive synovitis here as well. An inferior medial portal was made and on probing there is a tear of the posterior third of meniscus consistent with preoperative MRI involving the inner half of the posterior third of the medial meniscus. This was addressed with meniscal punch and ArthriCare wand to a stable margin. There is extensive partial-thickness loss of articular cartilage over most of the femoral condyle and tibial  condyle centrally without  fissuring to the bone or exposed bone but significant partial-thickness loss. In the notch the anterior cruciate ligament was intact but again synovium impinging in the notch. Lateral compartment there is extensive tear of the posterior horn of the could be displaced into the joint appeared that the posterior horn was detached and with meniscal punch and wand this was resected until there is a stable margin there is milder degenerative changes to the lateral compartment. At this point the partial synovectomy was carried out in the suprapatellar pouch medial and lateral gutters as well as in the anterior compartment of in front of the anterior cruciate ligament and medial compartment after thorough debridement of the synovium with the ArthroCare wand the knee was irrigated until clear. The wounds were closed with simple 4-0 nylon and 20 cc of half percent Sensorcaine with epinephrine injected for postop analgesia. Xeroform 4 x 4 web roll and Ace wrap applied  PLAN OF CARE: Discharge to home after PACU  PATIENT DISPOSITION:  PACU - hemodynamically stable.

## 2017-05-30 NOTE — Transfer of Care (Signed)
Immediate Anesthesia Transfer of Care Note  Patient: Tammy Guerra  Procedure(s) Performed: KNEE ARTHROSCOPY WITH MEDIAL AND LATERAL  MENISECTOMY (Right Knee) SYNOVECTOMY (Right Knee)  Patient Location: PACU  Anesthesia Type:General  Level of Consciousness: sedated  Airway & Oxygen Therapy: Patient Spontanous Breathing and Patient connected to face mask oxygen  Post-op Assessment: Report given to RN and Post -op Vital signs reviewed and stable  Post vital signs: Reviewed and stable  Last Vitals:  Vitals:   05/30/17 0623  BP: 129/74  Pulse: 70  Resp: 17  Temp: 36.5 C  SpO2: 98%    Last Pain:  Vitals:   05/30/17 0623  TempSrc: Tympanic  PainSc: 2          Complications: No apparent anesthesia complications

## 2017-05-30 NOTE — H&P (Signed)
Reviewed paper H+P, will be scanned into chart. No changes noted.  

## 2017-05-30 NOTE — Anesthesia Post-op Follow-up Note (Signed)
Anesthesia QCDR form completed.        

## 2017-05-30 NOTE — Anesthesia Preprocedure Evaluation (Signed)
Anesthesia Evaluation  Patient identified by MRN, date of birth, ID band Patient awake    Reviewed: Allergy & Precautions, NPO status , Patient's Chart, lab work & pertinent test results  History of Anesthesia Complications Negative for: history of anesthetic complications  Airway Mallampati: III  TM Distance: >3 FB Neck ROM: Full    Dental  (+)  Loose bridge upper left:   Pulmonary neg sleep apnea, neg COPD, Current Smoker,    breath sounds clear to auscultation- rhonchi (-) wheezing      Cardiovascular Exercise Tolerance: Good (-) hypertension(-) CAD, (-) Past MI and (-) Cardiac Stents  Rhythm:Regular Rate:Normal - Systolic murmurs and - Diastolic murmurs    Neuro/Psych Anxiety negative neurological ROS     GI/Hepatic Neg liver ROS, GERD  ,  Endo/Other  negative endocrine ROSneg diabetes  Renal/GU negative Renal ROS     Musculoskeletal  (+) Arthritis ,   Abdominal (+) + obese,   Peds  Hematology negative hematology ROS (+)   Anesthesia Other Findings Past Medical History: No date: Anxiety No date: Arthritis No date: Cancer ([place])     Comment:  skin/CERVICAL CA No date: Complication of anesthesia     Comment:  discomfort during first cataract No date: GERD (gastroesophageal reflux disease)     Comment:  NO MEDS No date: History of kidney stones     Comment:  STONES AND CYSTS No date: Palpitations   Reproductive/Obstetrics                            Anesthesia Physical Anesthesia Plan  ASA: II  Anesthesia Plan: General   Post-op Pain Management:    Induction: Intravenous  PONV Risk Score and Plan: 1 and Dexamethasone and Ondansetron  Airway Management Planned: LMA  Additional Equipment:   Intra-op Plan:   Post-operative Plan:   Informed Consent: I have reviewed the patients History and Physical, chart, labs and discussed the procedure including the risks, benefits  and alternatives for the proposed anesthesia with the patient or authorized representative who has indicated his/her understanding and acceptance.   Dental advisory given  Plan Discussed with: CRNA and Anesthesiologist  Anesthesia Plan Comments:         Anesthesia Quick Evaluation

## 2017-06-24 ENCOUNTER — Telehealth: Payer: Self-pay | Admitting: *Deleted

## 2017-06-24 DIAGNOSIS — Z122 Encounter for screening for malignant neoplasm of respiratory organs: Secondary | ICD-10-CM

## 2017-06-24 DIAGNOSIS — Z87891 Personal history of nicotine dependence: Secondary | ICD-10-CM

## 2017-06-24 NOTE — Telephone Encounter (Signed)
Received referral for initial lung cancer screening scan. Contacted patient and obtained smoking history,(current 41 pack year) as well as answering questions related to screening process. Patient denies signs of lung cancer such as weight loss or hemoptysis. Patient denies comorbidity that would prevent curative treatment if lung cancer were found. Patient is scheduled for shared decision making visit and CT scan on 07/18/17.

## 2017-07-18 ENCOUNTER — Encounter: Payer: Self-pay | Admitting: Nurse Practitioner

## 2017-07-18 ENCOUNTER — Inpatient Hospital Stay: Payer: Medicare HMO | Attending: Nurse Practitioner | Admitting: Nurse Practitioner

## 2017-07-18 ENCOUNTER — Ambulatory Visit
Admission: RE | Admit: 2017-07-18 | Discharge: 2017-07-18 | Disposition: A | Discharge status: Home / Self Care | Payer: Medicare HMO | Source: Ambulatory Visit | Attending: Nurse Practitioner | Admitting: Nurse Practitioner

## 2017-07-18 DIAGNOSIS — I2510 Atherosclerotic heart disease of native coronary artery without angina pectoris: Secondary | ICD-10-CM | POA: Insufficient documentation

## 2017-07-18 DIAGNOSIS — F17210 Nicotine dependence, cigarettes, uncomplicated: Secondary | ICD-10-CM | POA: Diagnosis not present

## 2017-07-18 DIAGNOSIS — I700 Atherosclerosis of aorta: Secondary | ICD-10-CM | POA: Insufficient documentation

## 2017-07-18 DIAGNOSIS — N289 Disorder of kidney and ureter, unspecified: Secondary | ICD-10-CM | POA: Insufficient documentation

## 2017-07-18 DIAGNOSIS — Z122 Encounter for screening for malignant neoplasm of respiratory organs: Secondary | ICD-10-CM

## 2017-07-18 DIAGNOSIS — Z87891 Personal history of nicotine dependence: Secondary | ICD-10-CM | POA: Diagnosis present

## 2017-07-18 NOTE — Progress Notes (Signed)
In accordance with CMS guidelines, patient has met eligibility criteria including age, absence of signs or symptoms of lung cancer.  Social History   Tobacco Use  . Smoking status: Current Every Day Smoker    Packs/day: 1.00    Years: 41.00    Pack years: 41.00    Types: Cigarettes  . Smokeless tobacco: Never Used  . Tobacco comment: 15 CIG DAILY  Substance Use Topics  . Alcohol use: No  . Drug use: No     A shared decision-making session was conducted prior to the performance of CT scan. This includes one or more decision aids, includes benefits and harms of screening, follow-up diagnostic testing, over-diagnosis, false positive rate, and total radiation exposure.  Counseling on the importance of adherence to annual lung cancer LDCT screening, impact of co-morbidities, and ability or willingness to undergo diagnosis and treatment is imperative for compliance of the program.  Counseling on the importance of continued smoking cessation for former smokers; the importance of smoking cessation for current smokers, and information about tobacco cessation interventions have been given to patient including [place] and [address] [facility] [place] programs.  Written order for lung cancer screening with LDCT has been given to the patient and any and all questions have been answered to the best of my abilities.   Yearly follow up will be coordinated by Burgess Estelle, Thoracic Navigator.  Beckey Rutter, DNP, AGNP-C [place] [facility] [phone] (office) 07/18/17 4:15 PM

## 2017-07-22 ENCOUNTER — Telehealth: Payer: Self-pay | Admitting: *Deleted

## 2017-07-22 ENCOUNTER — Other Ambulatory Visit: Payer: Self-pay | Admitting: Internal Medicine

## 2017-07-22 DIAGNOSIS — Z1231 Encounter for screening mammogram for malignant neoplasm of breast: Secondary | ICD-10-CM

## 2017-07-22 NOTE — Telephone Encounter (Signed)
Notified patient of LDCT lung cancer screening program results with recommendation for 12 month follow up imaging. Also notified of incidental findings noted below and is encouraged to discuss further with PCP who will receive a copy of this note and/or the CT report. Patient verbalizes understanding.   IMPRESSION: 1. Lung-RADS 2, benign appearance or behavior. Continue annual screening with low-dose chest CT without contrast in 12 months. 2.  Aortic Atherosclerosis (ICD10-I70.0). 3. Lad and RCA coronary artery calcifications. 4. **An incidental finding of potential clinical significance has been found. There is a complicated exophytic lesion arising from upper pole of left kidney. This is incompletely characterized on this study. Further evaluation with nonemergent contrast enhanced renal protocol MRI or CT is advised.**

## 2017-07-23 ENCOUNTER — Other Ambulatory Visit: Payer: Self-pay | Admitting: Internal Medicine

## 2017-07-23 DIAGNOSIS — N289 Disorder of kidney and ureter, unspecified: Secondary | ICD-10-CM

## 2017-07-30 ENCOUNTER — Ambulatory Visit
Admission: RE | Admit: 2017-07-30 | Discharge: 2017-07-30 | Disposition: A | Discharge status: Home / Self Care | Payer: Medicare HMO | Source: Ambulatory Visit | Attending: Internal Medicine | Admitting: Internal Medicine

## 2017-07-30 DIAGNOSIS — N281 Cyst of kidney, acquired: Secondary | ICD-10-CM | POA: Diagnosis not present

## 2017-07-30 DIAGNOSIS — I700 Atherosclerosis of aorta: Secondary | ICD-10-CM | POA: Diagnosis not present

## 2017-07-30 DIAGNOSIS — N200 Calculus of kidney: Secondary | ICD-10-CM | POA: Diagnosis not present

## 2017-07-30 DIAGNOSIS — R9389 Abnormal findings on diagnostic imaging of other specified body structures: Secondary | ICD-10-CM | POA: Insufficient documentation

## 2017-07-30 DIAGNOSIS — N289 Disorder of kidney and ureter, unspecified: Secondary | ICD-10-CM | POA: Diagnosis present

## 2017-07-30 LAB — POCT I-STAT CREATININE: Creatinine, Ser: 0.7 mg/dL (ref 0.44–1.00)

## 2017-07-30 MED ORDER — IOPAMIDOL (ISOVUE-370) INJECTION 76%
100.0000 mL | Freq: Once | INTRAVENOUS | Status: AC | PRN
  Administered 2017-07-30: 100 mL via INTRAVENOUS

## 2017-08-06 ENCOUNTER — Other Ambulatory Visit: Payer: Self-pay | Admitting: Internal Medicine

## 2017-08-06 ENCOUNTER — Other Ambulatory Visit: Payer: Self-pay | Admitting: Family Medicine

## 2017-08-06 DIAGNOSIS — M109 Gout, unspecified: Secondary | ICD-10-CM

## 2017-08-06 DIAGNOSIS — M79672 Pain in left foot: Secondary | ICD-10-CM

## 2017-08-06 DIAGNOSIS — K8689 Other specified diseases of pancreas: Secondary | ICD-10-CM

## 2017-08-12 ENCOUNTER — Ambulatory Visit: Admission: RE | Admit: 2017-08-12 | Payer: Medicare HMO | Source: Ambulatory Visit

## 2017-08-15 ENCOUNTER — Ambulatory Visit
Admission: RE | Admit: 2017-08-15 | Discharge: 2017-08-15 | Disposition: A | Discharge status: Home / Self Care | Payer: Medicare HMO | Source: Ambulatory Visit | Attending: Internal Medicine | Admitting: Internal Medicine

## 2017-08-15 DIAGNOSIS — Z1231 Encounter for screening mammogram for malignant neoplasm of breast: Secondary | ICD-10-CM | POA: Insufficient documentation

## 2017-08-17 ENCOUNTER — Ambulatory Visit
Admission: RE | Admit: 2017-08-17 | Discharge: 2017-08-17 | Disposition: A | Discharge status: Home / Self Care | Payer: Medicare HMO | Source: Ambulatory Visit | Attending: Internal Medicine | Admitting: Internal Medicine

## 2017-08-17 ENCOUNTER — Other Ambulatory Visit: Payer: Self-pay | Admitting: Internal Medicine

## 2017-08-17 DIAGNOSIS — K869 Disease of pancreas, unspecified: Secondary | ICD-10-CM | POA: Diagnosis present

## 2017-08-17 DIAGNOSIS — K8689 Other specified diseases of pancreas: Secondary | ICD-10-CM

## 2017-08-17 DIAGNOSIS — I700 Atherosclerosis of aorta: Secondary | ICD-10-CM | POA: Insufficient documentation

## 2017-08-17 DIAGNOSIS — K862 Cyst of pancreas: Secondary | ICD-10-CM | POA: Diagnosis present

## 2017-08-17 DIAGNOSIS — K760 Fatty (change of) liver, not elsewhere classified: Secondary | ICD-10-CM | POA: Insufficient documentation

## 2017-08-17 DIAGNOSIS — N281 Cyst of kidney, acquired: Secondary | ICD-10-CM | POA: Insufficient documentation

## 2017-08-17 MED ORDER — GADOBENATE DIMEGLUMINE 529 MG/ML IV SOLN
18.0000 mL | Freq: Once | INTRAVENOUS | Status: AC | PRN
  Administered 2017-08-17: 18 mL via INTRAVENOUS

## 2017-08-20 ENCOUNTER — Other Ambulatory Visit: Payer: Self-pay | Admitting: Internal Medicine

## 2017-08-20 DIAGNOSIS — K862 Cyst of pancreas: Secondary | ICD-10-CM

## 2017-09-10 DIAGNOSIS — M170 Bilateral primary osteoarthritis of knee: Secondary | ICD-10-CM | POA: Insufficient documentation

## 2017-09-10 DIAGNOSIS — M47816 Spondylosis without myelopathy or radiculopathy, lumbar region: Secondary | ICD-10-CM | POA: Insufficient documentation

## 2017-09-10 DIAGNOSIS — T466X5A Adverse effect of antihyperlipidemic and antiarteriosclerotic drugs, initial encounter: Secondary | ICD-10-CM | POA: Insufficient documentation

## 2017-09-10 DIAGNOSIS — M65331 Trigger finger, right middle finger: Secondary | ICD-10-CM | POA: Insufficient documentation

## 2017-09-24 ENCOUNTER — Ambulatory Visit (INDEPENDENT_AMBULATORY_CARE_PROVIDER_SITE_OTHER): Payer: Medicare HMO | Admitting: Vascular Surgery

## 2017-09-24 ENCOUNTER — Encounter (INDEPENDENT_AMBULATORY_CARE_PROVIDER_SITE_OTHER): Payer: Self-pay | Admitting: Vascular Surgery

## 2017-09-24 VITALS — BP 129/65 | HR 60 | Resp 17 | Ht 65.5 in | Wt 190.0 lb

## 2017-09-24 DIAGNOSIS — T466X5A Adverse effect of antihyperlipidemic and antiarteriosclerotic drugs, initial encounter: Secondary | ICD-10-CM | POA: Insufficient documentation

## 2017-09-24 DIAGNOSIS — F17200 Nicotine dependence, unspecified, uncomplicated: Secondary | ICD-10-CM | POA: Diagnosis not present

## 2017-09-24 DIAGNOSIS — K219 Gastro-esophageal reflux disease without esophagitis: Secondary | ICD-10-CM

## 2017-09-24 DIAGNOSIS — I739 Peripheral vascular disease, unspecified: Secondary | ICD-10-CM

## 2017-09-24 NOTE — Assessment & Plan Note (Signed)
We had a discussion for approximately 3-4 minutes regarding the absolute need for smoking cessation due to the deleterious nature of tobacco on the vascular system. We discussed the tobacco use would diminish patency of any intervention, and likely significantly worsen progressio of disease. We discussed multiple agents for quitting including replacement therapy or medications to reduce cravings such as Chantix. The patient voices their understanding of the importance of smoking cessation.  

## 2017-09-24 NOTE — Patient Instructions (Signed)
Peripheral Vascular Disease Peripheral vascular disease (PVD) is a disease of the blood vessels that are not part of your heart and brain. A simple term for PVD is poor circulation. In most cases, PVD narrows the blood vessels that carry blood from your heart to the rest of your body. This can result in a decreased supply of blood to your arms, legs, and internal organs, like your stomach or kidneys. However, it most often affects a person's lower legs and feet. There are two types of PVD.  Organic PVD. This is the more common type. It is caused by damage to the structure of blood vessels.  Functional PVD. This is caused by conditions that make blood vessels contract and tighten (spasm).  Without treatment, PVD tends to get worse over time. PVD can also lead to acute ischemic limb. This is when an arm or limb suddenly has trouble getting enough blood. This is a medical emergency. What are the causes? Each type of PVD has many different causes. The most common cause of PVD is buildup of a fatty material (plaque) inside of your arteries (atherosclerosis). Small amounts of plaque can break off from the walls of the blood vessels and become lodged in a smaller artery. This blocks blood flow and can cause acute ischemic limb. Other common causes of PVD include:  Blood clots that form inside of blood vessels.  Injuries to blood vessels.  Diseases that cause inflammation of blood vessels or cause blood vessel spasms.  Health behaviors and health history that increase your risk of developing PVD.  What increases the risk? You may have a greater risk of PVD if you:  Have a family history of PVD.  Have certain medical conditions, including: ? High cholesterol. ? Diabetes. ? High blood pressure (hypertension). ? Coronary heart disease. ? Past problems with blood clots. ? Past injury, such as burns or a broken bone. These may have damaged blood vessels in your limbs. ? Buerger disease. This is  caused by inflamed blood vessels in your hands and feet. ? Some forms of arthritis. ? Rare birth defects that affect the arteries in your legs.  Use tobacco.  Do not get enough exercise.  Are obese.  Are age 50 or older.  What are the signs or symptoms? PVD may cause many different symptoms. Your symptoms depend on what part of your body is not getting enough blood. Some common signs and symptoms include:  Cramps in your lower legs. This may be a symptom of poor leg circulation (claudication).  Pain and weakness in your legs while you are physically active that goes away when you rest (intermittent claudication).  Leg pain when at rest.  Leg numbness, tingling, or weakness.  Coldness in a leg or foot, especially when compared with the other leg.  Skin or hair changes. These can include: ? Hair loss. ? Shiny skin. ? Pale or bluish skin. ? Thick toenails.  Inability to get or maintain an erection (erectile dysfunction).  People with PVD are more prone to developing ulcers and sores on their toes, feet, or legs. These may take longer than normal to heal. How is this diagnosed? Your health care provider may diagnose PVD from your signs and symptoms. The health care provider will also do a physical exam. You may have tests to find out what is causing your PVD and determine its severity. Tests may include:  Blood pressure recordings from your arms and legs and measurements of the strength of your pulses (  pulse volume recordings).  Imaging studies using sound waves to take pictures of the blood flow through your blood vessels (Doppler ultrasound).  Injecting a dye into your blood vessels before having imaging studies using: ? X-rays (angiogram or arteriogram). ? Computer-generated X-rays (CT angiogram). ? A powerful electromagnetic field and a computer (magnetic resonance angiogram or MRA).  How is this treated? Treatment for PVD depends on the cause of your condition and the  severity of your symptoms. It also depends on your age. Underlying causes need to be treated and controlled. These include long-lasting (chronic) conditions, such as diabetes, high cholesterol, and high blood pressure. You may need to first try making lifestyle changes and taking medicines. Surgery may be needed if these do not work. Lifestyle changes may include:  Quitting smoking.  Exercising regularly.  Following a low-fat, low-cholesterol diet.  Medicines may include:  Blood thinners to prevent blood clots.  Medicines to improve blood flow.  Medicines to improve your blood cholesterol levels.  Surgical procedures may include:  A procedure that uses an inflated balloon to open a blocked artery and improve blood flow (angioplasty).  A procedure to put in a tube (stent) to keep a blocked artery open (stent implant).  Surgery to reroute blood flow around a blocked artery (peripheral bypass surgery).  Surgery to remove dead tissue from an infected wound on the affected limb.  Amputation. This is surgical removal of the affected limb. This may be necessary in cases of acute ischemic limb that are not improved through medical or surgical treatments.  Follow these instructions at home:  Take medicines only as directed by your health care provider.  Do not use any tobacco products, including cigarettes, chewing tobacco, or electronic cigarettes. If you need help quitting, ask your health care provider.  Lose weight if you are overweight, and maintain a healthy weight as directed by your health care provider.  Eat a diet that is low in fat and cholesterol. If you need help, ask your health care provider.  Exercise regularly. Ask your health care provider to suggest some good activities for you.  Use compression stockings or other mechanical devices as directed by your health care provider.  Take good care of your feet. ? Wear comfortable shoes that fit well. ? Check your feet  often for any cuts or sores. Contact a health care provider if:  You have cramps in your legs while walking.  You have leg pain when you are at rest.  You have coldness in a leg or foot.  Your skin changes.  You have erectile dysfunction.  You have cuts or sores on your feet that are not healing. Get help right away if:  Your arm or leg turns cold and blue.  Your arms or legs become red, warm, swollen, painful, or numb.  You have chest pain or trouble breathing.  You suddenly have weakness in your face, arm, or leg.  You become very confused or lose the ability to speak.  You suddenly have a very bad headache or lose your vision. This information is not intended to replace advice given to you by your health care provider. Make sure you discuss any questions you have with your health care provider. Document Released: 05/17/2004 Document Revised: 09/15/2015 Document Reviewed: 09/17/2013 Elsevier Interactive Patient Education  2017 [employer]  

## 2017-09-24 NOTE — Assessment & Plan Note (Signed)
Continue antihypertensive medications as already ordered, these medications have been reviewed and there are no changes at this time.  Avoidence of caffeine and alcohol  Moderate elevation of the head of the bed  

## 2017-09-24 NOTE — Progress Notes (Signed)
Patient ID: Tammy Guerra, female   DOB: Jun 18, 1950, 67 y.o.   MRN: [id number]  Chief Complaint  Patient presents with  . New Patient (Initial Visit)    ref Tammy Guerra for claudication    HPI Tammy Guerra is a 67 y.o. female.  I am asked to see the patient by Dr. Barb Merino for evaluation of claudication.  The patient reports months of worsening pain in her legs.  She states that her walking distances have dramatically decreased and are now only about 50 feet before having to stop and rest.  She says she cannot go up stairs or hills basically at all.  Both legs are affected.  The left leg may be a little bit worse.  No ulceration or infection.  Does describe some numbness and tingling in her feet and ankles.  Has had some cramps waking her up at night in the left leg over the past couple of weeks.  Given her multiple atherosclerotic risk factors, the symptoms prompted referral and evaluation for peripheral arterial disease with [place].   Past Medical History:  Diagnosis Date  . Anxiety   . Arthritis   . Cancer [facility])    skin/CERVICAL CA  . Complication of anesthesia    discomfort during first cataract  . GERD (gastroesophageal reflux disease)    NO MEDS  . History of kidney stones    STONES AND CYSTS  . Palpitations     Past Surgical History:  Procedure Laterality Date  . ABDOMINAL HYSTERECTOMY    . CARDIAC CATHETERIZATION    . CATARACT EXTRACTION W/PHACO Left 09/07/2014   Procedure: CATARACT EXTRACTION PHACO AND INTRAOCULAR LENS PLACEMENT (IOC);  Surgeon: Birder Robson, MD;  Location: [facility] ORS;  Service: Ophthalmology;  Laterality: Left;  [place] 01:03 AP% 27.7 CDE 17.55  . CORONARY ANGIOPLASTY    . EYE SURGERY     cataract  . KNEE ARTHROSCOPY WITH MEDIAL MENISECTOMY Right 05/30/2017   Procedure: KNEE ARTHROSCOPY WITH MEDIAL AND LATERAL  MENISECTOMY;  Surgeon: Hessie Knows, MD;  Location: [facility] ORS;  Service: Orthopedics;  Laterality: Right;  . SYNOVECTOMY Right 05/30/2017   Procedure:  SYNOVECTOMY;  Surgeon: Hessie Knows, MD;  Location: [facility] ORS;  Service: Orthopedics;  Laterality: Right;    Family History  Problem Relation Age of Onset  . Breast cancer Neg Hx   No bleeding disorders, clotting disorders, autoimmune diseases, or aneurysms Father had severe hardening the arteries and died in his 40s  Social History Social History   Tobacco Use  . Smoking status: Current Every Day Smoker    Packs/day: 1.00    Years: 41.00    Pack years: 41.00    Types: Cigarettes  . Smokeless tobacco: Never Used  . Tobacco comment: 15 CIG DAILY  Substance Use Topics  . Alcohol use: No  . Drug use: No     No Known Allergies  Current Outpatient Medications  Medication Sig Dispense Refill  . ALPRAZolam (XANAX) 0.5 MG tablet Take 0.5-0.75 mg by mouth at bedtime as needed for sleep.     . DULoxetine (CYMBALTA) 60 MG capsule Take 60 mg by mouth every morning.     . hydrocortisone 2.5 % cream     . meloxicam (MOBIC) 7.5 MG tablet     . HYDROcodone-acetaminophen (NORCO) 5-325 MG tablet Take 1-2 tablets by mouth every 6 (six) hours as needed for moderate pain. (Patient not taking: Reported on 09/24/2017) 30 tablet 0  . hydrOXYzine (ATARAX/VISTARIL) 25 MG tablet Take 25  mg by mouth 3 (three) times daily as needed for anxiety.     No current facility-administered medications for this visit.       REVIEW OF SYSTEMS (Negative unless checked)  Constitutional: [] Weight loss  [] Fever  [] Chills Cardiac: [] Chest pain   [] Chest pressure   [] Palpitations   [] Shortness of breath when laying flat   [] Shortness of breath at rest   [] Shortness of breath with exertion. Vascular:  [x] Pain in legs with walking   [] Pain in legs at rest   [] Pain in legs when laying flat   [x] Claudication   [] Pain in feet when walking  [] Pain in feet at rest  [] Pain in feet when laying flat   [] History of DVT   [] Phlebitis   [] Swelling in legs   [] Varicose veins   [] Non-healing ulcers Pulmonary:   [] Uses home oxygen    [] Productive cough   [] Hemoptysis   [] Wheeze  [] COPD   [] Asthma Neurologic:  [] Dizziness  [] Blackouts   [] Seizures   [] History of stroke   [] History of TIA  [] Aphasia   [] Temporary blindness   [] Dysphagia   [] Weakness or numbness in arms   [] Weakness or numbness in legs Musculoskeletal:  [x] Arthritis   [] Joint swelling   [x] Joint pain   [] Low back pain Hematologic:  [] Easy bruising  [] Easy bleeding   [] Hypercoagulable state   [] Anemic  [] Hepatitis Gastrointestinal:  [] Blood in stool   [] Vomiting blood  [x] Gastroesophageal reflux/heartburn   [] Abdominal pain Genitourinary:  [] Chronic kidney disease   [] Difficult urination  [] Frequent urination  [] Burning with urination   [] Hematuria Skin:  [] Rashes   [] Ulcers   [] Wounds Psychological:  [] History of anxiety   []  History of major depression.    Physical Exam BP 129/65 (BP Location: Right Arm)   Pulse 60   Resp 17   Ht 5' 5.5" (1.664 m)   Wt 190 lb (86.2 kg)   BMI 31.14 kg/m  Gen:  WD/WN, NAD Head: [place]/AT, No temporalis wasting.  Ear/Nose/Throat: Hearing grossly intact, nares w/o erythema or drainage, oropharynx w/o Erythema/Exudate Eyes: Conjunctiva clear, sclera non-icteric  Neck: trachea midline.  No JVD.  Pulmonary:  Good air movement, respirations not labored, no use of accessory muscles Cardiac: RRR Vascular:  Vessel Right Left  Radial Palpable Palpable                          PT  1+ palpable  1+ palpable  DP  1+ palpable  trace palpable   Gastrointestinal: soft, non-tender/non-distended.  Musculoskeletal: M/S 5/5 throughout.  Extremities without ischemic changes.  No deformity or atrophy. No edema. Neurologic: Sensation grossly intact in extremities.  Symmetrical.  Speech is fluent. Motor exam as listed above. Psychiatric: Judgment intact, Mood & affect appropriate for pt's clinical situation. Dermatologic: No rashes or ulcers noted.  No cellulitis or open wounds.    Radiology No results found.  Labs Recent  Results (from the past 2160 hour(s))  I-STAT creatinine     Status: None   Collection Time: 07/30/17  8:55 AM  Result Value Ref Range   Creatinine, Ser 0.70 0.44 - 1.00 mg/dL    Assessment/Plan:  Tobacco use disorder We had a discussion for approximately 3-4 minutes regarding the absolute need for smoking cessation due to the deleterious nature of tobacco on the vascular system. We discussed the tobacco use would diminish patency of any intervention, and likely significantly worsen progressio of disease. We discussed multiple agents for quitting including replacement therapy  or medications to reduce cravings such as Chantix. The patient voices their understanding of the importance of smoking cessation.  GERD (gastroesophageal reflux disease) Continue antihypertensive medications as already ordered, these medications have been reviewed and there are no changes at this time.  Avoidence of caffeine and alcohol  Moderate elevation of the head of the bed   Claudication [facility]) Recommend:  Patient should undergo arterial non-invasive assessment of the lower extremity in the near future because there has been a significant deterioration in the patient's lower extremity symptoms.  The patient states they are having increased pain and a marked decrease in the distance that they can walk.  The risks and benefits as well as the alternatives were discussed in detail with the patient.  All questions were answered.  Patient agrees to proceed and understands this could be a prelude to angiography and intervention.  The patient will follow up with me in the office to review the studies.       Leotis Pain 09/24/2017, 1:15 PM   This note was created with Dragon medical transcription system.  Any errors from dictation are unintentional.

## 2017-09-24 NOTE — Assessment & Plan Note (Signed)
Recommend:  Patient should undergo arterial non-invasive assessment of the lower extremity in the near future because there has been a significant deterioration in the patient's lower extremity symptoms.  The patient states they are having increased pain and a marked decrease in the distance that they can walk.  The risks and benefits as well as the alternatives were discussed in detail with the patient.  All questions were answered.  Patient agrees to proceed and understands this could be a prelude to angiography and intervention.  The patient will follow up with me in the office to review the studies.

## 2017-10-04 ENCOUNTER — Ambulatory Visit: Admit: 2017-10-04 | Payer: Medicare HMO | Admitting: Gastroenterology

## 2017-10-04 SURGERY — COLONOSCOPY WITH PROPOFOL
Anesthesia: General

## 2017-10-28 ENCOUNTER — Ambulatory Visit (INDEPENDENT_AMBULATORY_CARE_PROVIDER_SITE_OTHER): Payer: Medicare HMO | Admitting: Vascular Surgery

## 2017-10-28 ENCOUNTER — Ambulatory Visit (INDEPENDENT_AMBULATORY_CARE_PROVIDER_SITE_OTHER): Payer: Medicare HMO

## 2017-10-28 ENCOUNTER — Encounter (INDEPENDENT_AMBULATORY_CARE_PROVIDER_SITE_OTHER): Payer: Self-pay | Admitting: Vascular Surgery

## 2017-10-28 VITALS — BP 127/78 | HR 62 | Resp 16 | Ht 65.5 in | Wt 192.0 lb

## 2017-10-28 DIAGNOSIS — I739 Peripheral vascular disease, unspecified: Secondary | ICD-10-CM

## 2017-10-28 DIAGNOSIS — F17200 Nicotine dependence, unspecified, uncomplicated: Secondary | ICD-10-CM

## 2017-10-28 NOTE — Progress Notes (Signed)
Subjective:    Patient ID: Tammy Guerra, female    DOB: 06-13-1950, 67 y.o.   MRN: [id number] Chief Complaint  Patient presents with  . Follow-up    pt conv abi   Patient presents to review vascular studies.  The patient was originally seen on September 24, 2017 for evaluation of bilateral lower extremity claudication.  The patient notes that her discomfort is still present.  The patient experiences rather painful claudication to the bilateral lower extremity.  The patient denies any rest pain or ulceration formation to the bilateral lower extremity.  The patient notes that her pain is with activity.  The patient underwent a bilateral ABI which was notable for right lower extremity: Triphasic blood flow distally with normal great toe pressures.  Right ABI 1.19.  Left lower extremity arterial duplex was notable for triphasic CFA transitioning to monophasic at the popliteal artery distally to abnormal great toe pressures.  Left ABI 0.78.  The patient notes that she does have bilateral knee arthritis and has undergone a right arthroscopic knee in February.  The patient has undergone an MRI and states she has arthritis to the lumbar spine.  The patient denies any fever, nausea or vomiting.  Review of Systems  Constitutional: Negative.   HENT: Negative.   Eyes: Negative.   Respiratory: Negative.   Cardiovascular: Negative.        Lower extremity pain  Gastrointestinal: Negative.   Endocrine: Negative.   Genitourinary: Negative.   Musculoskeletal: Negative.   Skin: Negative.   Allergic/Immunologic: Negative.   Neurological: Negative.   Hematological: Negative.   Psychiatric/Behavioral: Negative.       Objective:   Physical Exam  Constitutional: She is oriented to person, place, and time. She appears well-developed and well-nourished. No distress.  HENT:  Head: Normocephalic and atraumatic.  Right Ear: External ear normal.  Left Ear: External ear normal.  Eyes: Pupils are equal, round, and  reactive to light. Conjunctivae and EOM are normal.  Neck: Normal range of motion.  Cardiovascular: Normal rate, regular rhythm, normal heart sounds and intact distal pulses.  Pulses:      Radial pulses are 2+ on the right side, and 2+ on the left side.       Dorsalis pedis pulses are 1+ on the right side, and 1+ on the left side.       Posterior tibial pulses are 1+ on the right side, and 1+ on the left side.  Pulmonary/Chest: Effort normal and breath sounds normal.  Musculoskeletal: Normal range of motion. She exhibits no edema.  Neurological: She is alert and oriented to person, place, and time.  Skin: Skin is warm and dry. She is not diaphoretic.  Psychiatric: She has a normal mood and affect. Her behavior is normal. Judgment and thought content normal.  Vitals reviewed.  BP 127/78 (BP Location: Right Arm)   Pulse 62   Resp 16   Ht 5' 5.5" (1.664 m)   Wt 192 lb (87.1 kg)   BMI 31.46 kg/m   Past Medical History:  Diagnosis Date  . Anxiety   . Arthritis   . Cancer [facility])    skin/CERVICAL CA  . Complication of anesthesia    discomfort during first cataract  . GERD (gastroesophageal reflux disease)    NO MEDS  . History of kidney stones    STONES AND CYSTS  . Palpitations    Social History   Socioeconomic History  . Marital status: Married    Spouse name:  Not on file  . Number of children: Not on file  . Years of education: Not on file  . Highest education level: Not on file  Occupational History  . Not on file  Social Needs  . Financial resource strain: Not on file  . Food insecurity:    Worry: Not on file    Inability: Not on file  . Transportation needs:    Medical: Not on file    Non-medical: Not on file  Tobacco Use  . Smoking status: Current Every Day Smoker    Packs/day: 1.00    Years: 41.00    Pack years: 41.00    Types: Cigarettes  . Smokeless tobacco: Never Used  . Tobacco comment: 15 CIG DAILY  Substance and Sexual Activity  . Alcohol use: No    . Drug use: No  . Sexual activity: Not on file  Lifestyle  . Physical activity:    Days per week: Not on file    Minutes per session: Not on file  . Stress: Not on file  Relationships  . Social connections:    Talks on phone: Not on file    Gets together: Not on file    Attends religious service: Not on file    Active member of club or organization: Not on file    Attends meetings of clubs or organizations: Not on file    Relationship status: Not on file  . Intimate partner violence:    Fear of current or ex partner: Not on file    Emotionally abused: Not on file    Physically abused: Not on file    Forced sexual activity: Not on file  Other Topics Concern  . Not on file  Social History Narrative  . Not on file   Past Surgical History:  Procedure Laterality Date  . ABDOMINAL HYSTERECTOMY    . CARDIAC CATHETERIZATION    . CATARACT EXTRACTION W/PHACO Left 09/07/2014   Procedure: CATARACT EXTRACTION PHACO AND INTRAOCULAR LENS PLACEMENT (IOC);  Surgeon: Birder Robson, MD;  Location: [facility] ORS;  Service: Ophthalmology;  Laterality: Left;  [place] 01:03 AP% 27.7 CDE 17.55  . CORONARY ANGIOPLASTY    . EYE SURGERY     cataract  . KNEE ARTHROSCOPY WITH MEDIAL MENISECTOMY Right 05/30/2017   Procedure: KNEE ARTHROSCOPY WITH MEDIAL AND LATERAL  MENISECTOMY;  Surgeon: Hessie Knows, MD;  Location: [facility] ORS;  Service: Orthopedics;  Laterality: Right;  . SYNOVECTOMY Right 05/30/2017   Procedure: SYNOVECTOMY;  Surgeon: Hessie Knows, MD;  Location: [facility] ORS;  Service: Orthopedics;  Laterality: Right;   Family History  Problem Relation Age of Onset  . Breast cancer Neg Hx    No Known Allergies     Assessment & Plan:  Patient presents to review vascular studies.  The patient was originally seen on September 24, 2017 for evaluation of bilateral lower extremity claudication.  The patient notes that her discomfort is still present.  The patient experiences rather painful claudication to the bilateral  lower extremity.  The patient denies any rest pain or ulceration formation to the bilateral lower extremity.  The patient notes that her pain is with activity.  The patient underwent a bilateral ABI which was notable for right lower extremity: Triphasic blood flow distally with normal great toe pressures.  Right ABI 1.19.  Left lower extremity arterial duplex was notable for triphasic CFA transitioning to monophasic at the popliteal artery distally to abnormal great toe pressures.  Left ABI 0.78.  The patient notes  that she does have bilateral knee arthritis and has undergone a right arthroscopic knee in February.  The patient has undergone an MRI and states she has arthritis to the lumbar spine.  The patient denies any fever, nausea or vomiting.  1. PAD (peripheral artery disease) ([place]) - New The patient continues to experience lifestyle limiting claudication-like symptoms to the bilateral lower extremity ABI with triphasic waveforms to the right lower extremity and normal big toe pressures Patient with monophasic blood flow to the left lower extremity which would possibly explain her claudication symptoms to that extremity. I do not feel that there is any peripheral artery disease/extremity ischemia to the right lower extremity which would be causing the patient and her significant claudication-like symptoms.  I recommended the patient follow-up with her primary care physician to rule out any contributing osteoarthritis or degenerative joint disease that may be contributing to her discomfort. We discussed moving forward with a left lower extremity angiogram however at this time the patient would like to follow-up in 6 months and follow-up with her orthopedist/seek a second opinion from a neurosurgeon about her back. If the patient changes her mind and would like to move forward with a left lower extremity angiogram or diagnostic angiogram she is to call her office sooner otherwise I will see her back in 6  months for an ABI and a bilateral lower extremity arterial duplex.  - VAS [place] ABI WITH/WO TBI; Future - VAS [place] LOWER EXTREMITY ARTERIAL DUPLEX; Future  2. Tobacco use disorder - Stable We had a discussion for approximately five minutes regarding the absolute need for smoking cessation due to the deleterious nature of tobacco on the vascular system. We discussed the tobacco use would diminish patency of any intervention, and likely significantly worsen progressio of disease. We discussed multiple agents for quitting including replacement therapy or medications to reduce cravings such as Chantix. The patient voices their understanding of the importance of smoking cessation.  Current Outpatient Medications on File Prior to Visit  Medication Sig Dispense Refill  . ALPRAZolam (XANAX) 0.5 MG tablet Take 0.5-0.75 mg by mouth at bedtime as needed for sleep.     . DULoxetine (CYMBALTA) 60 MG capsule Take 60 mg by mouth every morning.     . hydrocortisone 2.5 % cream     . hydrOXYzine (ATARAX/VISTARIL) 25 MG tablet Take 25 mg by mouth 3 (three) times daily as needed for anxiety.    . meloxicam (MOBIC) 7.5 MG tablet     . HYDROcodone-acetaminophen (NORCO) 5-325 MG tablet Take 1-2 tablets by mouth every 6 (six) hours as needed for moderate pain. (Patient not taking: Reported on 09/24/2017) 30 tablet 0   No current facility-administered medications on file prior to visit.    There are no Patient Instructions on file for this visit. No follow-ups on file.  Annebelle Bostic A Kelsea Mousel, PA-C

## 2018-02-19 ENCOUNTER — Ambulatory Visit: Payer: Medicare HMO

## 2018-05-27 ENCOUNTER — Encounter: Payer: Self-pay | Admitting: Physical Therapy

## 2018-05-27 ENCOUNTER — Other Ambulatory Visit: Payer: Self-pay

## 2018-05-27 ENCOUNTER — Ambulatory Visit: Payer: Medicare HMO | Attending: Neurology | Admitting: Physical Therapy

## 2018-05-27 DIAGNOSIS — R262 Difficulty in walking, not elsewhere classified: Secondary | ICD-10-CM

## 2018-05-27 DIAGNOSIS — M6281 Muscle weakness (generalized): Secondary | ICD-10-CM

## 2018-05-27 NOTE — Patient Instructions (Signed)
WALKING  Walking is a great form of exercise to increase your strength, endurance and overall fitness.  A walking program can help you start slowly and gradually build endurance as you go.  Everyone's ability is different, so each person's starting point will be different.  You do not have to follow them exactly.  The are just samples. You should simply find out what's right for you and stick to that program.   In the beginning, you'll start off walking 2-3 times a day for short distances.  As you get stronger, you'll be walking further at just 1-2 times per day.  A. You Can Walk For A Certain Length Of Time Each Day    Walk 5 minutes 3 times per day.  Increase 2 minutes every 2 days (3 times per day).  Work up to 25-30 minutes (1-2 times per day).   Example:   Day 1-2 5 minutes 3-5 times per day   Day 7-8 8 minutes 3 times per day   Day 13-14 10 minutes 2 times per day  B. You Can Walk For a Certain Distance Each Day     Distance can be substituted for time.    Example:   3 trips to mailbox (at road)   3 trips to corner of block   3 trips around the block  C. Go to local [employer] and use the track.    Walk for distance ____ around track  Or time ____ minutes  D. Walk X____ Jog ____ Run ___  Please only do the exercises that your therapist has initialed and dated

## 2018-05-27 NOTE — Therapy (Signed)
[place] MAIN [facility] SERVICES [address] [place], [place], [address] Phone: [phone]   Fax:  [phone]  Physical Therapy Evaluation  Patient Details  Name: Tammy Guerra MRN: [id number] Date of Birth: Apr 10, 1951 Referring Provider (PT): Dr. Jennings Books   Encounter Date: 05/27/2018  PT End of Session - 05/27/18 1137    Visit Number  1    Number of Visits  5    Date for PT Re-Evaluation  06/24/18    Authorization Type  Eval on 05/27/18    Authorization Time Period  $35 copay    PT Start Time  0932    PT Stop Time  1040    PT Time Calculation (min)  68 min    Activity Tolerance  Patient limited by pain    Behavior During Therapy  Agitated;Anxious       Past Medical History:  Diagnosis Date  . Anxiety   . Arthritis   . Cancer [facility])    skin/CERVICAL CA  . Complication of anesthesia    discomfort during first cataract  . GERD (gastroesophageal reflux disease)    NO MEDS  . History of kidney stones    STONES AND CYSTS  . Palpitations     Past Surgical History:  Procedure Laterality Date  . ABDOMINAL HYSTERECTOMY    . CARDIAC CATHETERIZATION    . CATARACT EXTRACTION W/PHACO Left 09/07/2014   Procedure: CATARACT EXTRACTION PHACO AND INTRAOCULAR LENS PLACEMENT (IOC);  Surgeon: Birder Robson, MD;  Location: [facility] ORS;  Service: Ophthalmology;  Laterality: Left;  [place] 01:03 AP% 27.7 CDE 17.55  . CORONARY ANGIOPLASTY    . EYE SURGERY     cataract  . KNEE ARTHROSCOPY WITH MEDIAL MENISECTOMY Right 05/30/2017   Procedure: KNEE ARTHROSCOPY WITH MEDIAL AND LATERAL  MENISECTOMY;  Surgeon: Hessie Knows, MD;  Location: [facility] ORS;  Service: Orthopedics;  Laterality: Right;  . SYNOVECTOMY Right 05/30/2017   Procedure: SYNOVECTOMY;  Surgeon: Hessie Knows, MD;  Location: [facility] ORS;  Service: Orthopedics;  Laterality: Right;    There were no vitals filed for this visit.   Subjective Assessment - 05/27/18 0940    Subjective  "My life has  come to a stand still. No one can tell me what is wrong with my legs and fix it."     Pertinent History  68 yo Female reports increased neuropathic pain in BLE which has led to difficulty walking. She had a right knee injury with subsequent surgery in February 2019 (scope) with no improvement; She reports no improvement in right knee. She reports some weight gain over last year. Patient has seen a vascular doctor with no results; neurologist had NCV tests done which was normal with no radicular symptoms; MRI was negative for lumbar stenosis but did show typical aging deficits of lumbar disc degenerative disease; She does have B12 deficiency and is supposed to get B12 injections for 1x a week for 4 weeks but Dr. Ouida Sills (PCP) changed it to 1x a month for 4 months; She was also given a prescription for Vitamin D. She reports taking Gabapentin 600 mg for 2 months with no improvement and therefore she stopped taking it. She reports no change since getting B12 or vitamin D; She reports being very frustrated over lack of diagnosis and lack of treatment; In addition to discomfort in legs she has been diagnosed with carpal tunnel syndrome and trigger finger in hand; She presents to therapy without AD; She is only able to walk  approximately 500 feet with increased tingling in feet with heaviness in legs; She denies any recent falls. Sitting relieves symptoms; She reports pain in knees limits transfers and squatting ability; She denies any significant back pain unless prolonged sitting unsupported; Patient does have anxiety which is controlled with medication;     Limitations  Standing;Walking    How long can you sit comfortably?  NA= less symptoms with sitting    How long can you stand comfortably?  approximately 5-10 min symptoms begin; can't stand longer than 30 min;     How long can you walk comfortably?  reports tingling and burning in feet after walking 500 feet;     Diagnostic tests  MRI of lumbar spine in Sept  2019 shows lumbar disc degeneration; no nerve impingement no stenosis;     Patient Stated Goals  "figure out what exercise I can do to help."     Currently in Pain?  Yes    Pain Score  7     Pain Location  Leg    Pain Orientation  Right;Left    Pain Descriptors / Indicators  Burning;Tingling    Pain Type  Chronic pain;Neuropathic pain    Pain Radiating Towards  starts in feet and radiates into mid thigh;     Pain Onset  More than a month ago    Pain Frequency  Intermittent    Aggravating Factors   standing/walking    Pain Relieving Factors  sitting or lying down- usually lays on back;     Effect of Pain on Daily Activities  decreased walking ability and decreased mobility;     Multiple Pain Sites  No         [facility] PT Assessment - 05/27/18 0001      Assessment   Medical Diagnosis  Neuropathic pain in BLE    Referring Provider (PT)  Dr. Jennings Books    Onset Date/Surgical Date  --   4 years ago   Hand Dominance  Right    Next MD Visit  none scheduled; Dr. Manuella Ghazi recommended follow up in April    Prior Therapy  Denies any PT for this condition;       Precautions   Precautions  None      Restrictions   Weight Bearing Restrictions  No      Balance Screen   Has the patient fallen in the past 6 months  No    Has the patient had a decrease in activity level because of a fear of falling?   No    Is the patient reluctant to leave their home because of a fear of falling?   No      Home Environment   Additional Comments  Lives in mobile home with 5 steps to enter with B rail assist; negotiates one step at a time with increased time and pain in knees; mod I for self care ADLs; does have difficulty washing feet; takes longer time to don shoes/socks      Prior Function   Level of Independence  Independent;Independent with basic ADLs;Independent with gait    Vocation  Retired    Leisure  Education officer, environmental   Overall Cognitive Status  Within Functional Limits for tasks assessed     Behaviors  Poor frustration tolerance   very frustrated with this condition and lack of answers     Observation/Other Assessments   Observations  right 3rd digit has trigger finger unable to  flex digit; mild swelling noted;     Skin Integrity  grossly intact      Sensation   Light Touch  Appears Intact    Proprioception  Appears Intact    Additional Comments  does have intermittent numbness in BLE but light touch sensation is intact;      Coordination   Gross Motor Movements are Fluid and Coordinated  Yes    Fine Motor Movements are Fluid and Coordinated  Yes    Finger Nose Finger Test  accurate bilaterally;      Posture/Postural Control   Posture Comments  sits with mild slumped posture but able to self correct with verbal cues;      AROM   Overall AROM Comments  BUE and BLE are WFL; decreased knee extension on RLE with increased pain and crepitis noted;      Strength   Overall Strength Comments  BUE 5/5; BLE: hip flexion 4/5, hip abduction/adduction and extension 3+/5 bilaterally, knee flexion 4/5, ankle DF 4/5; knee extension not tested due to knee pain; PF 4/5 bilaterally;      Palpation   Spinal mobility  hypomobile throughout lumbar spine; pain at L3, L4, L5    Palpation comment  moderate tenderness lower lumbar paraspinals; minimal paraspinal tightness noted however decreased muscle tone noted with single leg hip extension      FABER test   Comment  positive on left for low back pain and tingling in foot      Slump test   Comment  negative bilaterally;      Prone Knee Bend Test   Comment  negative bilaterally      Straight Leg Raise   Comment  negative bilaterally;      Transfers   Comments  able to transfer sit<>Stand without pushing on chair, does report increased right knee pain;       Ambulation/Gait   Gait Comments  ambulates with reciprocal gait pattern, uneven cadence antalgic gait right, slightly slower gait speed, good foot clearance, decreased knee  flexion and decreased hip flexion during swing;       Standardized Balance Assessment   Five times sit to stand comments   23.8 sec without HHA    10 Meter Walk  1.02 m/s community ambulator                Objective measurements completed on examination: See above findings.  Initiated walking program            PT Education - 05/27/18 1137    Education Details  recommendation/plan of care    Person(s) Educated  Patient    Methods  Explanation    Comprehension  Verbalized understanding       PT Short Term Goals - 05/27/18 1147      PT SHORT TERM GOAL #1   Title  Patient will be adherent to HEP at least 3x a week to improve functional strength and balance for better safety at home.    Time  2    Period  Weeks    Status  New    Target Date  06/10/18      PT SHORT TERM GOAL #2   Title  Patient will get up at least 1x every hour to reduce prolonged sitting for improved mobility during the day.     Time  2    Period  Weeks    Status  New    Target Date  06/10/18  PT Long Term Goals - 05/27/18 1148      PT LONG TERM GOAL #1   Title  Patient (> 13 years old) will complete five times sit to stand test in < 20 seconds indicating an increased LE strength and improved balance.    Time  4    Period  Weeks    Status  New    Target Date  06/24/18      PT LONG TERM GOAL #2   Title  Patient will increase BLE gross strength to 4+/5 as to improve functional strength for independent gait, increased standing tolerance and increased ADL ability.    Time  4    Period  Weeks    Status  New    Target Date  06/24/18      PT LONG TERM GOAL #3   Title  Patient will increase lower extremity functional scale to >50/80 to demonstrate improved functional mobility and increased tolerance with ADLs.     Time  4    Period  Weeks    Status  New    Target Date  06/24/18             Plan - 05/27/18 1141    Clinical Impression Statement  68 yo Female reports  increased neuropathic pain including tingling and burning that starts in her feet and radiates up legs to mid thigh. She reports that this usually comes on with standing and walking and is relieved with sitting and supine positioning. This has been going on for last 4 years. To date most diagnostic tests have been negative including NCV testing, vascular ultrasound, MRI of lumbar spine, etc. She did get diagnosed with low B12 and is getting B12 shots. She reports she cannot tell a difference since getting the injections. Patient does exhibit weakness in BLE particularly in hips. She has pain with most movement especially in right knee. Increased crepitis and stiffness noted in bilateral knees indicative of knee OA. Patient reports getting a scope on RLE knee in Feb 2019 which did not help at all. She has experienced decreased mobility over last year and is concernd about her limited walking. She would benefit from skilled PT Intervention to address weakness and immobility with hopes of this helping neuropathic pain.     History and Personal Factors relevant to plan of care:  lives with husband, has stairs to enter house, chronic pain, smoker, decreased mobility, no recent falls    Clinical Presentation  Evolving    Clinical Presentation due to:  has chronic RLE knee pain due to OA which is degenerative and progressive neuropathic pain with uncertain etiology    Clinical Decision Making  Moderate    Rehab Potential  Fair    Clinical Impairments Affecting Rehab Potential  positive: anxious to get results/motivated; con: multiple co-morbidities which affect care    PT Frequency  1x / week    PT Duration  4 weeks    PT Treatment/Interventions  Aquatic Therapy;Cryotherapy;Electrical Stimulation;Moist Heat;Gait training;Stair training;Functional mobility training;Therapeutic activities;Therapeutic exercise;Balance training;Neuromuscular re-education;Patient/family education;Orthotic Fit/Training;Manual  techniques;Passive range of motion;Energy conservation;Taping    PT Next Visit Plan  advance HEP    PT Home Exercise Plan  initiated with walking program    Consulted and Agree with Plan of Care  Patient       Patient will benefit from skilled therapeutic intervention in order to improve the following deficits and impairments:  Decreased activity tolerance, Decreased endurance, Decreased range of motion, Decreased strength, Hypomobility,  Pain, Difficulty walking, Decreased mobility  Visit Diagnosis: Muscle weakness (generalized)  Difficulty in walking, not elsewhere classified     Problem List Patient Active Problem List   Diagnosis Date Noted  . PAD (peripheral artery disease) ([place]) 10/28/2017  . Tobacco use disorder 09/24/2017  . GERD (gastroesophageal reflux disease) 09/24/2017  . Claudication [facility]) 09/24/2017    Trotter,Margaret PT,DPT 05/27/2018, 11:49 AM  [place] MAIN [facility] SERVICES [address] [place], [place], [address] Phone: [phone]   Fax:  [phone]  Name: Tammy Guerra MRN: [id number] Date of Birth: 1950/08/17

## 2018-05-29 ENCOUNTER — Ambulatory Visit: Payer: Medicare HMO | Admitting: Physical Therapy

## 2018-06-03 ENCOUNTER — Ambulatory Visit: Payer: Medicare HMO | Admitting: Physical Therapy

## 2018-06-05 ENCOUNTER — Encounter: Payer: Medicare HMO | Admitting: Physical Therapy

## 2018-06-05 ENCOUNTER — Ambulatory Visit: Payer: Medicare HMO

## 2018-06-10 ENCOUNTER — Ambulatory Visit: Payer: Medicare HMO | Admitting: Physical Therapy

## 2018-06-12 ENCOUNTER — Ambulatory Visit: Payer: Medicare HMO | Admitting: Physical Therapy

## 2018-06-17 ENCOUNTER — Ambulatory Visit: Payer: Medicare HMO | Admitting: Physical Therapy

## 2018-06-19 ENCOUNTER — Ambulatory Visit: Payer: Medicare HMO | Admitting: Physical Therapy

## 2018-06-24 ENCOUNTER — Encounter: Payer: Medicare HMO | Admitting: Physical Therapy

## 2018-06-24 ENCOUNTER — Ambulatory Visit: Payer: Medicare HMO | Admitting: Physical Therapy

## 2018-06-25 ENCOUNTER — Encounter: Payer: Self-pay | Admitting: *Deleted

## 2018-06-26 ENCOUNTER — Ambulatory Visit: Payer: Medicare HMO | Admitting: Physical Therapy

## 2018-07-01 ENCOUNTER — Ambulatory Visit: Payer: Medicare HMO | Admitting: Physical Therapy

## 2018-07-03 ENCOUNTER — Ambulatory Visit: Payer: Medicare HMO | Admitting: Physical Therapy

## 2018-07-08 ENCOUNTER — Ambulatory Visit: Payer: Medicare HMO | Admitting: Physical Therapy

## 2018-07-10 ENCOUNTER — Ambulatory Visit: Payer: Medicare HMO | Admitting: Physical Therapy

## 2018-07-10 ENCOUNTER — Encounter: Payer: Self-pay | Admitting: *Deleted

## 2018-07-15 ENCOUNTER — Ambulatory Visit: Payer: Medicare HMO | Admitting: Physical Therapy

## 2018-07-17 ENCOUNTER — Ambulatory Visit: Payer: Medicare HMO | Admitting: Physical Therapy

## 2018-07-22 ENCOUNTER — Ambulatory Visit: Payer: Medicare HMO | Admitting: Physical Therapy

## 2018-07-24 ENCOUNTER — Ambulatory Visit: Payer: Medicare HMO | Admitting: Physical Therapy

## 2018-08-06 ENCOUNTER — Ambulatory Visit: Payer: Self-pay | Admitting: Urology

## 2018-08-26 DIAGNOSIS — I700 Atherosclerosis of aorta: Secondary | ICD-10-CM | POA: Insufficient documentation

## 2018-09-01 ENCOUNTER — Telehealth: Payer: Self-pay | Admitting: *Deleted

## 2018-09-01 NOTE — Telephone Encounter (Signed)
Left message for patient to notify them that it is time to schedule annual low dose lung cancer screening CT scan. Instructed patient to call back to verify information prior to the scan being scheduled.  

## 2018-09-02 ENCOUNTER — Telehealth: Payer: Self-pay | Admitting: *Deleted

## 2018-09-02 DIAGNOSIS — Z87891 Personal history of nicotine dependence: Secondary | ICD-10-CM

## 2018-09-02 DIAGNOSIS — Z122 Encounter for screening for malignant neoplasm of respiratory organs: Secondary | ICD-10-CM

## 2018-09-02 NOTE — Telephone Encounter (Signed)
Patient has been notified that annual lung cancer screening low dose CT scan is due currently or will be in near future. Confirmed that patient is within the age range of 55-77, and asymptomatic, (no signs or symptoms of lung cancer). Patient denies illness that would prevent curative treatment for lung cancer if found. Verified smoking history, (current, 41.5 pack year). The shared decision making visit was done 07/18/17. Patient is agreeable for CT scan being scheduled.

## 2018-09-08 ENCOUNTER — Ambulatory Visit: Admission: RE | Admit: 2018-09-08 | Payer: Medicare HMO | Source: Ambulatory Visit

## 2018-09-16 ENCOUNTER — Ambulatory Visit
Admission: RE | Admit: 2018-09-16 | Discharge: 2018-09-16 | Disposition: A | Discharge status: Home / Self Care | Payer: Medicare HMO | Source: Ambulatory Visit | Attending: Oncology | Admitting: Oncology

## 2018-09-16 ENCOUNTER — Other Ambulatory Visit: Payer: Self-pay

## 2018-09-16 ENCOUNTER — Other Ambulatory Visit: Payer: Self-pay | Admitting: Otolaryngology

## 2018-09-16 DIAGNOSIS — Z87891 Personal history of nicotine dependence: Secondary | ICD-10-CM | POA: Insufficient documentation

## 2018-09-16 DIAGNOSIS — R42 Dizziness and giddiness: Secondary | ICD-10-CM

## 2018-09-16 DIAGNOSIS — Z122 Encounter for screening for malignant neoplasm of respiratory organs: Secondary | ICD-10-CM | POA: Insufficient documentation

## 2018-09-18 ENCOUNTER — Encounter: Payer: Self-pay | Admitting: *Deleted

## 2018-09-26 ENCOUNTER — Ambulatory Visit
Admission: RE | Admit: 2018-09-26 | Discharge: 2018-09-26 | Disposition: A | Discharge status: Home / Self Care | Payer: Medicare HMO | Source: Ambulatory Visit | Attending: Otolaryngology | Admitting: Otolaryngology

## 2018-09-26 ENCOUNTER — Other Ambulatory Visit: Payer: Self-pay

## 2018-09-26 DIAGNOSIS — R42 Dizziness and giddiness: Secondary | ICD-10-CM | POA: Diagnosis present

## 2018-09-26 MED ORDER — GADOBUTROL 1 MMOL/ML IV SOLN
7.5000 mL | Freq: Once | INTRAVENOUS | Status: AC | PRN
  Administered 2018-09-26: 7.5 mL via INTRAVENOUS

## 2018-10-30 ENCOUNTER — Other Ambulatory Visit: Payer: Self-pay | Admitting: Gastroenterology

## 2018-10-30 DIAGNOSIS — K862 Cyst of pancreas: Secondary | ICD-10-CM

## 2018-11-03 ENCOUNTER — Other Ambulatory Visit: Payer: Self-pay

## 2018-11-03 ENCOUNTER — Ambulatory Visit: Payer: Medicare HMO | Admitting: Urology

## 2018-11-03 ENCOUNTER — Encounter: Payer: Self-pay | Admitting: Urology

## 2018-11-03 VITALS — BP 122/73 | HR 74 | Ht 66.0 in

## 2018-11-03 DIAGNOSIS — N281 Cyst of kidney, acquired: Secondary | ICD-10-CM

## 2018-11-03 DIAGNOSIS — N200 Calculus of kidney: Secondary | ICD-10-CM

## 2018-11-03 NOTE — Progress Notes (Signed)
11/03/2018 12:48 PM   Tammy Guerra Aug 23, 1950 [id number]  Referring provider: Kirk Ruths, MD [facility] [place],  [place] [address]  Chief Complaint  Patient presents with  . Establish Care  . Nephrolithiasis    HPI: Tammy Guerra is a 68 year old female seen at the request of Dr. Ouida Sills for evaluation of nephrolithiasis and renal cysts.  She was previously followed by Dr. Jacqlyn Larsen in 2010 for nephrolithiasis and renal cyst.  She was followed with annual KUB/renal ultrasound but was lost to follow-up after 2013 when she lost her insurance.  KUB showed left nephrolithiasis and renal ultrasound was remarkable for left nephrolithiasis and large bilateral renal cysts.  She saw Dr. Ouida Sills April and May 2020 for left then right back pain which resolved after treating with tramadol.  A CT of the abdomen with and without contrast was performed on 07/30/2017 which showed a 4.3 x 5.2 x 4.0 cm complex left upper pole renal cyst with hyperdense material in the dependent portion of the cyst however there was no evidence of enhancement.  There were several nonobstructing calculi in a lower pole calyx.  A renal mass protocol MRI was performed on 08/17/2017 which showed bilateral simple renal cyst and a 4.8 cm left upper pole renal cyst which did not enhance and was felt consistent with a proteinaceous cyst which was categorized as Bosniak 65F.  There was also a 1.4 cm left renal cyst which was felt to be minimally complex.  She is presently asymptomatic.  She has no voiding complaints.  Her flank pain has resolved.  She denies gross hematuria.   PMH: Past Medical History:  Diagnosis Date  . Anxiety   . Arthritis   . Cancer [facility])    skin/CERVICAL CA  . Complication of anesthesia    discomfort during first cataract  . GERD (gastroesophageal reflux disease)    NO MEDS  . History of kidney stones    STONES AND CYSTS  . Kidney stone   .  Palpitations     Surgical History: Past Surgical History:  Procedure Laterality Date  . ABDOMINAL HYSTERECTOMY    . CARDIAC CATHETERIZATION    . CATARACT EXTRACTION W/PHACO Left 09/07/2014   Procedure: CATARACT EXTRACTION PHACO AND INTRAOCULAR LENS PLACEMENT (IOC);  Surgeon: Birder Robson, MD;  Location: [facility] ORS;  Service: Ophthalmology;  Laterality: Left;  [place] 01:03 AP% 27.7 CDE 17.55  . CORONARY ANGIOPLASTY    . EYE SURGERY     cataract  . KNEE ARTHROSCOPY WITH MEDIAL MENISECTOMY Right 05/30/2017   Procedure: KNEE ARTHROSCOPY WITH MEDIAL AND LATERAL  MENISECTOMY;  Surgeon: Hessie Knows, MD;  Location: [facility] ORS;  Service: Orthopedics;  Laterality: Right;  . SYNOVECTOMY Right 05/30/2017   Procedure: SYNOVECTOMY;  Surgeon: Hessie Knows, MD;  Location: [facility] ORS;  Service: Orthopedics;  Laterality: Right;    Home Medications:  Allergies as of 11/03/2018   No Known Allergies     Medication List       Accurate as of November 03, 2018 12:48 PM. If you have any questions, ask your nurse or doctor.        STOP taking these medications   HYDROcodone-acetaminophen 5-325 MG tablet Commonly known as: Norco Stopped by: Abbie Sons, MD     TAKE these medications   ALPRAZolam 0.5 MG tablet Commonly known as: XANAX Take 0.5-0.75 mg by mouth at bedtime as needed for sleep.   ALPRAZolam 0.25 MG tablet Commonly known as:  XANAX   cyanocobalamin 1000 MCG/ML injection Commonly known as: (VITAMIN B-12) Inject into the muscle.   DULoxetine 60 MG capsule Commonly known as: CYMBALTA Take 60 mg by mouth every morning.   gabapentin 100 MG capsule Commonly known as: NEURONTIN gabapentin 100 mg capsule  TAKE 1 CAPSULE BY MOUTH EVERY DAY IN THE EVENING   hydrocortisone 2.5 % cream   hydrOXYzine 25 MG tablet Commonly known as: ATARAX/VISTARIL Take 25 mg by mouth 3 (three) times daily as needed for anxiety.   meloxicam 15 MG tablet Commonly known as: MOBIC Take 15 mg by mouth daily.    nicotine polacrilex 4 MG gum Commonly known as: NICORETTE Take by mouth.   rosuvastatin 20 MG tablet Commonly known as: CRESTOR Take by mouth.   traMADol 50 MG tablet Commonly known as: ULTRAM TAKE 1 TABLET (50 MG TOTAL) BY MOUTH EVERY 6 (SIX) HOURS AS NEEDED FOR PAIN FOR UP TO 5 DAYS       Allergies: No Known Allergies  Family History: Family History  Problem Relation Age of Onset  . Breast cancer Neg Hx     Social History:  reports that she has been smoking cigarettes. She has a 20.50 pack-year smoking history. She has never used smokeless tobacco. She reports that she does not drink alcohol or use drugs.  ROS: UROLOGY Frequent Urination?: No Hard to postpone urination?: No Burning/pain with urination?: No Get up at night to urinate?: No Leakage of urine?: No Urine stream starts and stops?: No Trouble starting stream?: No Do you have to strain to urinate?: No Blood in urine?: No Urinary tract infection?: No Sexually transmitted disease?: No Injury to kidneys or bladder?: No Painful intercourse?: No Weak stream?: No Currently pregnant?: No Vaginal bleeding?: No Last menstrual period?: Hysterectomy  Gastrointestinal Nausea?: No Vomiting?: No Indigestion/heartburn?: No Diarrhea?: No Constipation?: No  Constitutional Fever: No Night sweats?: No Weight loss?: No Fatigue?: No  Skin Skin rash/lesions?: No Itching?: No  Eyes Blurred vision?: No Double vision?: No  Ears/Nose/Throat Sore throat?: No Sinus problems?: No  Hematologic/Lymphatic Swollen glands?: No Easy bruising?: No  Cardiovascular Leg swelling?: No Chest pain?: No  Respiratory Cough?: No Shortness of breath?: No  Endocrine Excessive thirst?: No  Musculoskeletal Back pain?: No Joint pain?: Yes  Neurological Headaches?: No Dizziness?: Yes  Psychologic Depression?: No Anxiety?: Yes  Physical Exam: BP 122/73 (BP Location: Left Arm, Patient Position: Sitting, Cuff  Size: Large)   Pulse 74   Ht 5\' 6"  (1.676 m)   BMI 30.67 kg/m   Constitutional:  Alert and oriented, No acute distress. HEENT: [place] AT, moist mucus membranes.  Trachea midline, no masses. Cardiovascular: No clubbing, cyanosis, or edema. Respiratory: Normal respiratory effort, no increased work of breathing. GI: Abdomen is soft, nontender, nondistended, no abdominal masses GU: No CVA tenderness Lymph: No cervical or inguinal lymphadenopathy. Skin: No rashes, bruises or suspicious lesions. Neurologic: Grossly intact, no focal deficits, moving all 4 extremities. Psychiatric: Normal mood and affect.  Pertinent Imaging: CT was personally reviewed  Assessment & Plan:   68 year old female with a Bosniak 84F left renal cyst stable in size since 2010.  Recommend a follow-up CT with/without contrast.  She has nonobstructing renal calculi and is asymptomatic.  Management options were discussed including surveillance, shockwave lithotripsy and ureteroscopy.  Surveillance was recommended.   Abbie Sons, [place] [address], [place], [place] [address] [phone]

## 2018-11-05 ENCOUNTER — Telehealth: Payer: Self-pay | Admitting: Urology

## 2018-11-05 DIAGNOSIS — N200 Calculus of kidney: Secondary | ICD-10-CM

## 2018-11-05 NOTE — Telephone Encounter (Signed)
You ordered a CT scan for this patient but she is scheduled for an MRI abd/pelvis with and w/o on the 21st. Do you still want the CT scan? I don't know if her insurance will approve it. I am sending the clinicals in today.   Sharyn Lull

## 2018-11-05 NOTE — Telephone Encounter (Signed)
The MRI was scheduled for evaluation for pancreatic cyst.  Please check with MR department to see if the renal cyst can be adequately evaluated with the scan and if so that will be fine

## 2018-11-07 NOTE — Telephone Encounter (Signed)
So her insurance did deny it but I spoke with Jinny Blossom in MRI and she said that they basically did the same protocol so she would have them scan her kidneys and that way we would have that also. She said she will note for them to read it but if they don't she can get them to do it for [place].   Thanks, Sharyn Lull

## 2018-11-11 ENCOUNTER — Other Ambulatory Visit: Payer: Self-pay

## 2018-11-11 ENCOUNTER — Ambulatory Visit
Admission: RE | Admit: 2018-11-11 | Discharge: 2018-11-11 | Disposition: A | Discharge status: Home / Self Care | Payer: Medicare HMO | Source: Ambulatory Visit | Attending: Gastroenterology | Admitting: Gastroenterology

## 2018-11-11 ENCOUNTER — Other Ambulatory Visit: Payer: Self-pay | Admitting: Gastroenterology

## 2018-11-11 DIAGNOSIS — K862 Cyst of pancreas: Secondary | ICD-10-CM | POA: Diagnosis present

## 2018-11-11 MED ORDER — GADOBUTROL 1 MMOL/ML IV SOLN
7.5000 mL | Freq: Once | INTRAVENOUS | Status: AC | PRN
  Administered 2018-11-11: 7.5 mL via INTRAVENOUS

## 2018-11-13 NOTE — Addendum Note (Signed)
Addended by: Tommy Rainwater on: 11/13/2018 01:06 PM   Modules accepted: Orders

## 2018-11-13 NOTE — Telephone Encounter (Signed)
Renal cysts on MRI are stable and are consistent with benign cysts.  Recommend a six-month follow-up with KUB.

## 2018-11-13 NOTE — Telephone Encounter (Signed)
Patient notified please schedule

## 2018-11-13 NOTE — Addendum Note (Signed)
Addended by: Abbie Sons on: 11/13/2018 07:33 AM   Modules accepted: Orders

## 2019-05-19 ENCOUNTER — Ambulatory Visit: Payer: Medicare HMO | Admitting: Urology

## 2019-05-20 ENCOUNTER — Ambulatory Visit: Payer: Medicare HMO | Admitting: Urology

## 2019-07-17 ENCOUNTER — Ambulatory Visit: Payer: Medicare HMO | Admitting: Urology

## 2019-07-21 NOTE — Progress Notes (Signed)
07/22/19 11:32 AM   Tammy Guerra 02-Jan-1951 [id number]  Referring provider: Kirk Ruths, MD [facility] [place],  [place] [address]  Chief Complaint  Patient presents with  . Nephrolithiasis    HPI: 69 y.o. female returns today for the follow-up of nephrolithiasis and renal cysts.   -Bosniak 60F left renal cyst stable in size since 2010  -CT 2019 left lower pole calculi  -KUB today 1 left lower pole calculus -No passing of stone since CT  -Recent episode of urinary frequency, urgency 2 weeks ago which resolved  PMH: Past Medical History:  Diagnosis Date  . Anxiety   . Arthritis   . Cancer [facility])    skin/CERVICAL CA  . Complication of anesthesia    discomfort during first cataract  . GERD (gastroesophageal reflux disease)    NO MEDS  . History of kidney stones    STONES AND CYSTS  . Kidney stone   . Palpitations     Surgical History: Past Surgical History:  Procedure Laterality Date  . ABDOMINAL HYSTERECTOMY    . CARDIAC CATHETERIZATION    . CATARACT EXTRACTION W/PHACO Left 09/07/2014   Procedure: CATARACT EXTRACTION PHACO AND INTRAOCULAR LENS PLACEMENT (IOC);  Surgeon: Birder Robson, MD;  Location: [facility] ORS;  Service: Ophthalmology;  Laterality: Left;  [place] 01:03 AP% 27.7 CDE 17.55  . CORONARY ANGIOPLASTY    . EYE SURGERY     cataract  . KNEE ARTHROSCOPY WITH MEDIAL MENISECTOMY Right 05/30/2017   Procedure: KNEE ARTHROSCOPY WITH MEDIAL AND LATERAL  MENISECTOMY;  Surgeon: Hessie Knows, MD;  Location: [facility] ORS;  Service: Orthopedics;  Laterality: Right;  . SYNOVECTOMY Right 05/30/2017   Procedure: SYNOVECTOMY;  Surgeon: Hessie Knows, MD;  Location: [facility] ORS;  Service: Orthopedics;  Laterality: Right;    Home Medications:  Allergies as of 07/22/2019   No Known Allergies     Medication List       Accurate as of July 22, 2019 11:32 AM. If you have any questions, ask your nurse or doctor.        ALPRAZolam 0.25 MG  tablet Commonly known as: XANAX   cyanocobalamin 1000 MCG/ML injection Commonly known as: (VITAMIN B-12) Inject into the muscle.   DULoxetine 60 MG capsule Commonly known as: CYMBALTA Take 60 mg by mouth every morning.   gabapentin 100 MG capsule Commonly known as: NEURONTIN gabapentin 100 mg capsule  TAKE 1 CAPSULE BY MOUTH EVERY DAY IN THE EVENING   hydrocortisone 2.5 % cream   hydrOXYzine 25 MG tablet Commonly known as: ATARAX/VISTARIL Take 25 mg by mouth 3 (three) times daily as needed for anxiety.   meloxicam 15 MG tablet Commonly known as: MOBIC Take 15 mg by mouth daily.   rosuvastatin 20 MG tablet Commonly known as: CRESTOR Take by mouth.   traMADol 50 MG tablet Commonly known as: ULTRAM TAKE 1 TABLET (50 MG TOTAL) BY MOUTH EVERY 6 (SIX) HOURS AS NEEDED FOR PAIN FOR UP TO 5 DAYS       Allergies: No Known Allergies  Family History: Family History  Problem Relation Age of Onset  . Breast cancer Neg Hx     Social History:  reports that she has been smoking cigarettes. She has a 20.50 pack-year smoking history. She has never used smokeless tobacco. She reports that she does not drink alcohol or use drugs.   Physical Exam: BP 118/78   Pulse 80   Ht 5\' 5"  (1.651 m)  Wt 200 lb (90.7 kg)   BMI 33.28 kg/m   Constitutional:  Alert and oriented, No acute distress. HEENT: [place] AT, moist mucus membranes.  Trachea midline, no masses. Cardiovascular: No clubbing, cyanosis, or edema. Respiratory: Normal respiratory effort, no increased work of breathing. Skin: No rashes, bruises or suspicious lesions. Neurologic: Grossly intact, no focal deficits, moving all 4 extremities. Psychiatric: Normal mood and affect.  Pertinent Imaging: KUB reviewed. See Epic.   Assessment & Plan:    1. Renal cyst MRI ordered by GI July 2020 with Bosniak 1/2 renal cysts Not classified as 63F States GI is going to follow pancreatic cyst annually Will check renal cysts with her  MRIs ordered by GI Return for annual KUB   2. Nephrolithiasis  KUB 1 left lower pole calculi  Asymptomatic  Continue to follow    Abbie Sons, MD  [place] [address], [place],  [address] [phone]  I, Lucas Mallow, am acting as a scribe for Dr. Nicki Reaper C. Brookie Wayment,  I have reviewed the above documentation for accuracy and completeness, and I agree with the above.   Abbie Sons, MD

## 2019-07-22 ENCOUNTER — Ambulatory Visit
Admission: RE | Admit: 2019-07-22 | Discharge: 2019-07-22 | Disposition: A | Discharge status: Home / Self Care | Payer: Medicare HMO | Source: Ambulatory Visit | Attending: Urology | Admitting: Urology

## 2019-07-22 ENCOUNTER — Other Ambulatory Visit: Payer: Self-pay

## 2019-07-22 ENCOUNTER — Ambulatory Visit: Payer: Medicare HMO | Admitting: Urology

## 2019-07-22 ENCOUNTER — Encounter: Payer: Self-pay | Admitting: Urology

## 2019-07-22 VITALS — BP 118/78 | HR 80 | Ht 65.0 in | Wt 200.0 lb

## 2019-07-22 DIAGNOSIS — N200 Calculus of kidney: Secondary | ICD-10-CM | POA: Insufficient documentation

## 2019-08-18 ENCOUNTER — Other Ambulatory Visit: Payer: Self-pay | Admitting: Internal Medicine

## 2019-08-18 DIAGNOSIS — Z1231 Encounter for screening mammogram for malignant neoplasm of breast: Secondary | ICD-10-CM

## 2019-08-20 ENCOUNTER — Other Ambulatory Visit: Payer: Self-pay

## 2019-08-20 ENCOUNTER — Encounter: Payer: Medicare HMO | Attending: Internal Medicine | Admitting: *Deleted

## 2019-08-20 ENCOUNTER — Encounter: Payer: Self-pay | Admitting: *Deleted

## 2019-08-20 VITALS — BP 110/76 | Ht 65.0 in | Wt 196.1 lb

## 2019-08-20 DIAGNOSIS — Z713 Dietary counseling and surveillance: Secondary | ICD-10-CM | POA: Diagnosis present

## 2019-08-20 DIAGNOSIS — E1165 Type 2 diabetes mellitus with hyperglycemia: Secondary | ICD-10-CM

## 2019-08-20 DIAGNOSIS — R739 Hyperglycemia, unspecified: Secondary | ICD-10-CM | POA: Insufficient documentation

## 2019-08-20 NOTE — Patient Instructions (Addendum)
Check blood sugars 1-2 x day before breakfast and/or 2 hrs after one meal every day Bring blood sugar records to the next class  Exercise: Walk as tolerated; don't sit for long periods  Eat 3 meals day, 2 snacks a day Space meals 4-6 hours apart Allow 2-3 hours between meals and snacks Don't skip meals Limit desserts/sweets  Quit smoking  Make an eye doctor appointment  Return for classes on:

## 2019-08-20 NOTE — Progress Notes (Signed)
Diabetes Self-Management Education  Visit Type: First/Initial  Appt. Start Time: 0900 Appt. End Time: 1000  08/20/2019  [name], identified by name and date of birth, is a 69 y.o. female with a diagnosis of Diabetes: Type 2.   ASSESSMENT  Blood pressure 110/76, height 5\' 5"  (1.651 m), weight 196 lb 1.6 oz (89 kg). Body mass index is 32.63 kg/m.  Diabetes Self-Management Education - 08/20/19 1013      Visit Information   Visit Type  First/Initial      Initial Visit   Diabetes Type  Type 2    Are you currently following a meal plan?  No    Are you taking your medications as prescribed?  Yes    Date Diagnosed  2 weeks - patient reports her blood sugars have been high for several years and MD didn't check her A1C until recently      Health Coping   How would you rate your overall health?  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  1 - Never      Pre-Education Assessment   Patient understands the diabetes disease and treatment process.  Needs Instruction    Patient understands incorporating nutritional management into lifestyle.  Needs Instruction    Patient undertands incorporating physical activity into lifestyle.  Needs Instruction    Patient understands using medications safely.  Needs Instruction    Patient understands monitoring blood glucose, interpreting and using results  Needs Review    Patient understands prevention,  detection, and treatment of acute complications.  Needs Instruction    Patient understands prevention, detection, and treatment of chronic complications.  Needs Instruction    Patient understands how to develop strategies to address psychosocial issues.  Needs Instruction    Patient understands how to develop strategies to promote health/change behavior.  Needs Instruction      Complications   Last HgB A1C per patient/outside source  10.1 %   08/04/2019   How often do you check your blood sugar?  3-4 times / week   Pt reports randomly checking blood sugars with readings of 190-230 mg/dL.   Have you had a dilated eye exam in the past 12 months?  No    Have you had a dental exam in the past 12 months?  No    Are you checking your feet?  No      Dietary Intake   Breakfast  sausage or bacon with eggs and toast (using low carb bread)    Snack (morning)  "snack all the time" - nuts, crackers and range dressing, celery and peanut butter    Lunch  skips    Dinner  ham sandwich with mayo or chicken, beef, pork, shrimp, potatoes, peas, corn, rice, pasta; doesn't eat much fruit and doesn't like many non-starchy vegetables    Beverage(s)  water, coffee with little milk      Exercise   Exercise Type  ADL's  Patient Education   Previous Diabetes Education  No    Disease state   Definition of diabetes, type 1 and 2, and the diagnosis of diabetes;Factors that contribute to the development of diabetes    Nutrition management   Role of diet in the treatment of diabetes and the relationship between the three main macronutrients and blood glucose level;Food label reading, portion sizes and measuring food.;Reviewed blood glucose goals for pre and post meals and how to evaluate the patients' food intake on their blood glucose level.    Physical activity and exercise   Role of exercise on diabetes management, blood pressure control and cardiac health.    Medications  Reviewed patients medication for  diabetes, action, purpose, timing of dose and side effects.    Monitoring  Purpose and frequency of SMBG.;Taught/discussed recording of test results and interpretation of SMBG.;Identified appropriate SMBG and/or A1C goals.    Chronic complications  Relationship between chronic complications and blood glucose control;Lipid levels, blood glucose control and heart disease    Psychosocial adjustment  Role of stress on diabetes;Identified and addressed patients feelings and concerns about diabetes    Personal strategies to promote health  Review risk of smoking and offered smoking cessation      Individualized Goals (developed by patient)   Reducing Risk  Other (comment)   improve blood sugars, decrease medications, prevent diabetes complications, lose weight, lead a healthier lifestyle, quit smoking, become more fit     Outcomes   Expected Outcomes  Demonstrated interest in learning. Expect positive outcomes       Individualized Plan for Diabetes Self-Management Training:   Learning Objective:  Patient will have a greater understanding of diabetes self-management. Patient education plan is to attend individual and/or group sessions per assessed needs and concerns.   Plan:   Patient Instructions  Check blood sugars 1-2 x day before breakfast and/or 2 hrs after one meal every day Bring blood sugar records to the next class Exercise: Walk as tolerated; don't sit for long periods Eat 3 meals day, 2 snacks a day Space meals 4-6 hours apart Allow 2-3 hours between meals and snacks Don't skip meals Limit desserts/sweets Quit smoking Make an eye doctor appointment  Expected Outcomes:  Demonstrated interest in learning. Expect positive outcomes  Education material provided:  General Meal Planning Guidelines Simple Meal Plan  If problems or questions, patient to contact team via:  Johny Drilling, RN, CCM, [place] [phone]  Future DSME appointment:  Patient to look at her calendar  and call back to schedule classes. She is primary caregiver for her husband who has medical issues and can't leave him alone for long periods.

## 2019-08-25 ENCOUNTER — Other Ambulatory Visit: Payer: Self-pay | Admitting: Gastroenterology

## 2019-08-25 ENCOUNTER — Telehealth: Payer: Self-pay | Admitting: *Deleted

## 2019-08-25 DIAGNOSIS — K862 Cyst of pancreas: Secondary | ICD-10-CM

## 2019-08-25 DIAGNOSIS — Z87891 Personal history of nicotine dependence: Secondary | ICD-10-CM

## 2019-08-25 DIAGNOSIS — Z122 Encounter for screening for malignant neoplasm of respiratory organs: Secondary | ICD-10-CM

## 2019-08-25 NOTE — Telephone Encounter (Signed)
Patient has been notified that annual lung cancer screening low dose CT scan is due currently or will be in near future. Confirmed that patient is within the age range of 55-77, and asymptomatic, (no signs or symptoms of lung cancer). Patient denies illness that would prevent curative treatment for lung cancer if found. Verified smoking history, (current, 42 pack year). The shared decision making visit was done 07/18/17. Patient is agreeable for CT scan being scheduled.

## 2019-09-06 ENCOUNTER — Other Ambulatory Visit: Payer: Self-pay

## 2019-09-06 ENCOUNTER — Ambulatory Visit
Admission: RE | Admit: 2019-09-06 | Discharge: 2019-09-06 | Disposition: A | Discharge status: Home / Self Care | Payer: Medicare HMO | Source: Ambulatory Visit | Attending: Gastroenterology | Admitting: Gastroenterology

## 2019-09-06 ENCOUNTER — Other Ambulatory Visit: Payer: Self-pay | Admitting: Gastroenterology

## 2019-09-06 DIAGNOSIS — K862 Cyst of pancreas: Secondary | ICD-10-CM | POA: Diagnosis present

## 2019-09-06 MED ORDER — GADOBUTROL 1 MMOL/ML IV SOLN
9.0000 mL | Freq: Once | INTRAVENOUS | Status: AC | PRN
  Administered 2019-09-06: 9 mL via INTRAVENOUS

## 2019-09-10 ENCOUNTER — Telehealth: Payer: Self-pay | Admitting: *Deleted

## 2019-09-10 NOTE — Telephone Encounter (Signed)
Phone call to patient to follow up with classes. She has "so much" going on with her husband that she can't schedule classes or an appointment with the dietitian. She reports that she wants to come but can't do it right now. Informed her that her referral is good for a year and to call for any questions of concerns. She reports that she was scheduled an appointment with [facility] but will need to cancel that because of her husbands appointments. Gave her the number to call them.

## 2019-09-22 ENCOUNTER — Ambulatory Visit
Admission: RE | Admit: 2019-09-22 | Discharge: 2019-09-22 | Disposition: A | Discharge status: Home / Self Care | Payer: Medicare HMO | Source: Ambulatory Visit | Attending: Internal Medicine | Admitting: Internal Medicine

## 2019-09-22 DIAGNOSIS — Z1231 Encounter for screening mammogram for malignant neoplasm of breast: Secondary | ICD-10-CM | POA: Diagnosis present

## 2019-09-24 ENCOUNTER — Ambulatory Visit
Admission: RE | Admit: 2019-09-24 | Discharge: 2019-09-24 | Disposition: A | Discharge status: Home / Self Care | Payer: Medicare HMO | Source: Ambulatory Visit | Attending: Oncology | Admitting: Oncology

## 2019-09-24 ENCOUNTER — Other Ambulatory Visit: Payer: Self-pay

## 2019-09-24 DIAGNOSIS — Z87891 Personal history of nicotine dependence: Secondary | ICD-10-CM | POA: Insufficient documentation

## 2019-09-24 DIAGNOSIS — Z122 Encounter for screening for malignant neoplasm of respiratory organs: Secondary | ICD-10-CM | POA: Diagnosis present

## 2019-09-30 ENCOUNTER — Encounter: Payer: Self-pay | Admitting: *Deleted

## 2019-11-17 LAB — HM COLONOSCOPY

## 2020-01-11 DIAGNOSIS — E118 Type 2 diabetes mellitus with unspecified complications: Secondary | ICD-10-CM | POA: Insufficient documentation

## 2020-06-27 DIAGNOSIS — K862 Cyst of pancreas: Secondary | ICD-10-CM | POA: Insufficient documentation

## 2020-06-30 NOTE — Progress Notes (Signed)
BP 128/81   Pulse 74   Temp 98.3 F (36.8 C)   Ht '5\' 5"'  (1.651 m)   Wt 191 lb 4 oz (86.8 kg)   SpO2 95%   BMI 31.83 kg/m    Subjective:    Patient ID: Tammy Guerra, female    DOB: 16-Jan-1951, 70 y.o.   MRN: [id number]  HPI: Tammy Guerra is a 70 y.o. female  Chief Complaint  Patient presents with  . Establish Care  . Anxiety    Patient states that she will have a thump in the center of her chest at times, not painful just enough to notice. Has happened years ago, but has recently restarted  . Hemorrhoids    Internal and external   ANXIETY/STRESS Duration:worse Anxious mood: yes  Excessive worrying: yes Irritability: yes  Sweating: no Nausea: no Palpitations:yes Patient feelings like the thumping is related to her anxiety.  Per [facility] has had testing before which has been negative. Hyperventilation: no Panic attacks: yes Agoraphobia: no  Obscessions/compulsions: no Depressed mood: yes Depression screen [facility] 2/9 07/01/2020 08/20/2019  Decreased Interest 2 3  Down, Depressed, Hopeless 1 3  PHQ - 2 Score 3 6  Altered sleeping 2 3  Tired, decreased energy 3 3  Change in appetite 2 3  Feeling bad or failure about yourself  0 0  Trouble concentrating 0 1  Moving slowly or fidgety/restless 0 0  Suicidal thoughts 0 0  PHQ-9 Score 10 16  Difficult doing work/chores Not difficult at all Very difficult   Anhedonia: no Weight changes: no Insomnia: yes hard to fall asleep  Hypersomnia: no Fatigue/loss of energy: yes Feelings of worthlessness: no Feelings of guilt: yes Impaired concentration/indecisiveness: yes Suicidal ideations: no  Crying spells: no Recent Stressors/Life Changes: Death of husband in November 29, 2019.   Relationship problems: yes   Family stress: yes     Financial stress: no    Job stress: no    Recent death/loss: yes  DIABETES Hypoglycemic episodes:no Polydipsia/polyuria: no Visual disturbance: no Chest pain: no Paresthesias:  no Glucose Monitoring: yes  Accucheck frequency: Daily  Fasting glucose: 126-140  Post prandial:  Evening:  Before meals: Taking Insulin?: no  Long acting insulin:  Short acting insulin: Blood Pressure Monitoring: not checking Retinal Examination: Up to Date Foot Exam: Up to Date Diabetic Education: Not Completed Pneumovax: Not up to Date Influenza: Not up to Date Aspirin: no  TOBACCO USE Patient see's a pulmonologist.  Gets her CT lung annually.  Denies symptoms of SOB.  Patient was down to 5 cigarettes per day but has since increased due to family stress.  Would like to quit but anxiety is too high at this time.   Relevant past medical, surgical, family and social history reviewed and updated as indicated. Interim medical history since our last visit reviewed. Allergies and medications reviewed and updated.  Review of Systems  Constitutional: Negative for unexpected weight change.  Eyes: Negative for visual disturbance.  Respiratory: Negative for cough, chest tightness and shortness of breath.   Cardiovascular: Positive for palpitations. Negative for chest pain and leg swelling.  Endocrine: Negative for polydipsia, polyphagia and polyuria.  Neurological: Negative for numbness.  Psychiatric/Behavioral: Positive for decreased concentration, dysphoric mood and sleep disturbance. Negative for suicidal ideas. The patient is nervous/anxious.     Per HPI unless specifically indicated above     Objective:    BP 128/81   Pulse 74   Temp 98.3 F (36.8 C)  Ht '5\' 5"'  (1.651 m)   Wt 191 lb 4 oz (86.8 kg)   SpO2 95%   BMI 31.83 kg/m   Wt Readings from Last 3 Encounters:  07/01/20 191 lb 4 oz (86.8 kg)  09/24/19 190 lb (86.2 kg)  08/20/19 196 lb 1.6 oz (89 kg)    Physical Exam Vitals and nursing note reviewed.  Constitutional:      General: She is not in acute distress.    Appearance: Normal appearance. She is normal weight. She is not ill-appearing, toxic-appearing or  diaphoretic.  HENT:     Head: Normocephalic.     Right Ear: External ear normal.     Left Ear: External ear normal.     Nose: Nose normal.     Mouth/Throat:     Mouth: Mucous membranes are moist.     Pharynx: Oropharynx is clear.  Eyes:     General:        Right eye: No discharge.        Left eye: No discharge.     Extraocular Movements: Extraocular movements intact.     Conjunctiva/sclera: Conjunctivae normal.     Pupils: Pupils are equal, round, and reactive to light.  Cardiovascular:     Rate and Rhythm: Normal rate and regular rhythm.     Heart sounds: No murmur heard.   Pulmonary:     Effort: Pulmonary effort is normal. No respiratory distress.     Breath sounds: Normal breath sounds. No wheezing or rales.  Musculoskeletal:     Cervical back: Normal range of motion and neck supple.  Skin:    General: Skin is warm and dry.     Capillary Refill: Capillary refill takes less than 2 seconds.  Neurological:     General: No focal deficit present.     Mental Status: She is alert and oriented to person, place, and time. Mental status is at baseline.  Psychiatric:        Mood and Affect: Mood normal.        Behavior: Behavior normal.        Thought Content: Thought content normal.        Judgment: Judgment normal.     Results for orders placed or performed during the hospital encounter of 07/30/17  I-STAT creatinine  Result Value Ref Range   Creatinine, Ser 0.70 0.44 - 1.00 mg/dL      Assessment & Plan:   Problem List Items Addressed This Visit      Cardiovascular and Mediastinum   Aortic atherosclerosis ([place]) - Primary    Chronic. Stable.  On Crestor.  Not fasting at visit today.  Will draw labs at future visit.  Continue with current regimen.        Coronary artery disease    Chronic. Stable.  On Crestor.  Not fasting at visit today.  Will draw labs at future visit.  Continue with current regimen.          Respiratory   Emphysema lung (HCC)    Chronic.   Stable.  Continue to follow up with Pulmonology.  Continue to follow up per their recommendations.         Endocrine   Uncontrolled type 2 diabetes mellitus with hyperglycemia (HCC)    Chronic.  A1c in September was 7.7 which was down from 10.4 earlier in 2021.  Patient has been on Metformin 108m daily.  Will begin Ozempic for A1c, CV and Renal benefits.  First dose given in the  office today with education and teach back.  Will follow up in 1 month to see how patient is tolerating medication.  Continue to check blood sugars at home.      Relevant Medications   Semaglutide,0.25 or 0.5MG/DOS, (OZEMPIC, 0.25 OR 0.5 MG/DOSE,) 2 MG/1.5ML SOPN   Other Relevant Orders   HgB A1c   Comp Met (CMET)   Microalbumin, Urine Waived     Other   Tobacco use disorder    Chronic.  Ongoing.  Not ready to quit smoking due to anxiety and stress.  Will evaluate at future visits.       Generalized anxiety disorder    Chronic.  Uncontrolled.  Patient has been using Xanax 2-3 x daily, especially at night to help her sleep.  Discussed risks of Benzodiazepines.  Discussed with patient that I will not be able to continue Xanax at this quantity.  Patient will need to be on maintenance medication and Xanax to be used PRN.  30 tabs should last patient 2-3 months.  Due to sleep being patient's biggest concern, will start Trazodone at night.  Follow up in 2 weeks for evaluation.      Relevant Medications   traZODone (DESYREL) 50 MG tablet   Insomnia    Chronic.  Uncontrolled. Will begin Trazodone nightly for sleep.  Discussed that Xanax is not meant to be used as a sleep aid and the risks of taking it for sleep.  Follow up in 2 weeks for reevaluation.       Other Visit Diagnoses    Encounter to establish care           Follow up plan: Return in about 2 weeks (around 07/15/2020) for Depression/Anxiety FU.

## 2020-07-01 ENCOUNTER — Ambulatory Visit (INDEPENDENT_AMBULATORY_CARE_PROVIDER_SITE_OTHER): Payer: Medicare HMO | Admitting: Nurse Practitioner

## 2020-07-01 ENCOUNTER — Other Ambulatory Visit: Payer: Self-pay

## 2020-07-01 ENCOUNTER — Encounter: Payer: Self-pay | Admitting: Nurse Practitioner

## 2020-07-01 VITALS — BP 128/81 | HR 74 | Temp 98.3°F | Ht 65.0 in | Wt 191.2 lb

## 2020-07-01 DIAGNOSIS — R69 Illness, unspecified: Secondary | ICD-10-CM | POA: Diagnosis not present

## 2020-07-01 DIAGNOSIS — E1165 Type 2 diabetes mellitus with hyperglycemia: Secondary | ICD-10-CM | POA: Insufficient documentation

## 2020-07-01 DIAGNOSIS — I2510 Atherosclerotic heart disease of native coronary artery without angina pectoris: Secondary | ICD-10-CM

## 2020-07-01 DIAGNOSIS — F411 Generalized anxiety disorder: Secondary | ICD-10-CM | POA: Diagnosis not present

## 2020-07-01 DIAGNOSIS — J439 Emphysema, unspecified: Secondary | ICD-10-CM | POA: Insufficient documentation

## 2020-07-01 DIAGNOSIS — G4700 Insomnia, unspecified: Secondary | ICD-10-CM | POA: Insufficient documentation

## 2020-07-01 DIAGNOSIS — I739 Peripheral vascular disease, unspecified: Secondary | ICD-10-CM

## 2020-07-01 DIAGNOSIS — Z7689 Persons encountering health services in other specified circumstances: Secondary | ICD-10-CM | POA: Diagnosis not present

## 2020-07-01 DIAGNOSIS — I700 Atherosclerosis of aorta: Secondary | ICD-10-CM

## 2020-07-01 DIAGNOSIS — F17200 Nicotine dependence, unspecified, uncomplicated: Secondary | ICD-10-CM

## 2020-07-01 HISTORY — DX: Emphysema, unspecified: J43.9

## 2020-07-01 LAB — MICROALBUMIN, URINE WAIVED
Creatinine, Urine Waived: 50 mg/dL (ref 10–300)
Microalb, Ur Waived: 10 mg/L (ref 0–19)

## 2020-07-01 MED ORDER — TRAZODONE HCL 50 MG PO TABS: 50.0000 mg | ORAL_TABLET | Freq: Every evening | ORAL | 0 refills | Status: DC | PRN

## 2020-07-01 MED ORDER — OZEMPIC (0.25 OR 0.5 MG/DOSE) 2 MG/1.5ML ~~LOC~~ SOPN: 0.2500 mg | PEN_INJECTOR | SUBCUTANEOUS | 1 refills | Status: DC

## 2020-07-01 NOTE — Patient Instructions (Signed)
Diabetes Mellitus and Exercise Exercising regularly is important for overall health, especially for people who have diabetes mellitus. Exercising is not only about losing weight. It has many other health benefits, such as increasing muscle strength and bone density and reducing body fat and stress. This leads to improved fitness, flexibility, and endurance, all of which result in better overall health. What are the benefits of exercise if I have diabetes? Exercise has many benefits for people with diabetes. They include:  Helping to lower and control blood sugar (glucose).  Helping the body to respond better to the hormone insulin by improving insulin sensitivity.  Reducing how much insulin the body needs.  Lowering the risk for heart disease by: ? Lowering "bad" cholesterol and triglyceride levels. ? Increasing "good" cholesterol levels. ? Lowering blood pressure. ? Lowering blood glucose levels. What is my activity plan? Your health care provider or certified diabetes educator can help you make a plan for the type and frequency of exercise that works for you. This is called your activity plan. Be sure to:  Get at least 150 minutes of medium-intensity or high-intensity exercise each week. Exercises may include brisk walking, biking, or water aerobics.  Do stretching and strengthening exercises, such as yoga or weight lifting, at least 2 times a week.  Spread out your activity over at least 3 days of the week.  Get some form of physical activity each day. ? Do not go more than 2 days in a row without some kind of physical activity. ? Avoid being inactive for more than 90 minutes at a time. Take frequent breaks to walk or stretch.  Choose exercises or activities that you enjoy. Set realistic goals.  Start slowly and gradually increase your exercise intensity over time.   How do I manage my diabetes during exercise? Monitor your blood glucose  Check your blood glucose before and  after exercising. If your blood glucose is: ? 240 mg/dL (13.3 mmol/L) or higher before you exercise, check your urine for ketones. These are chemicals created by the liver. If you have ketones in your urine, do not exercise until your blood glucose returns to normal. ? 100 mg/dL (5.6 mmol/L) or lower, eat a snack containing 15-20 grams of carbohydrate. Check your blood glucose 15 minutes after the snack to make sure that your glucose level is above 100 mg/dL (5.6 mmol/L) before you start your exercise.  Know the symptoms of low blood glucose (hypoglycemia) and how to treat it. Your risk for hypoglycemia increases during and after exercise. Follow these tips and your health care provider's instructions  Keep a carbohydrate snack that is fast-acting for use before, during, and after exercise to help prevent or treat hypoglycemia.  Avoid injecting insulin into areas of the body that are going to be exercised. For example, avoid injecting insulin into: ? Your arms, when you are about to play tennis. ? Your legs, when you are about to go jogging.  Keep records of your exercise habits. Doing this can help you and your health care provider adjust your diabetes management plan as needed. Write down: ? Food that you eat before and after you exercise. ? Blood glucose levels before and after you exercise. ? The type and amount of exercise you have done.  Work with your health care provider when you start a new exercise or activity. He or she may need to: ? Make sure that the activity is safe for you. ? Adjust your insulin, other medicines, and food that   you eat.  Drink plenty of water while you exercise. This prevents loss of water (dehydration) and problems caused by a lot of heat in the body (heat stroke).   Where to find more information  American Diabetes Association: www.diabetes.org Summary  Exercising regularly is important for overall health, especially for people who have diabetes  mellitus.  Exercising has many health benefits. It increases muscle strength and bone density and reduces body fat and stress. It also lowers and controls blood glucose.  Your health care provider or certified diabetes educator can help you make an activity plan for the type and frequency of exercise that works for you.  Work with your health care provider to make sure any new activity is safe for you. Also work with your health care provider to adjust your insulin, other medicines, and the food you eat. This information is not intended to replace advice given to you by your health care provider. Make sure you discuss any questions you have with your health care provider. Document Revised: 01/05/2019 Document Reviewed: 01/05/2019 Elsevier Patient Education  2021 Elsevier Inc.  

## 2020-07-01 NOTE — Assessment & Plan Note (Signed)
Chronic.  Uncontrolled.  Patient has been using Xanax 2-3 x daily, especially at night to help her sleep.  Discussed risks of Benzodiazepines.  Discussed with patient that I will not be able to continue Xanax at this quantity.  Patient will need to be on maintenance medication and Xanax to be used PRN.  30 tabs should last patient 2-3 months.  Due to sleep being patient's biggest concern, will start Trazodone at night.  Follow up in 2 weeks for evaluation.

## 2020-07-01 NOTE — Assessment & Plan Note (Signed)
Chronic.  A1c in September was 7.7 which was down from 10.4 earlier in 2021.  Patient has been on Metformin 1000mg  daily.  Will begin Ozempic for A1c, CV and Renal benefits.  First dose given in the office today with education and teach back.  Will follow up in 1 month to see how patient is tolerating medication.  Continue to check blood sugars at home.

## 2020-07-01 NOTE — Assessment & Plan Note (Signed)
Chronic.  Ongoing.  Not ready to quit smoking due to anxiety and stress.  Will evaluate at future visits.

## 2020-07-01 NOTE — Assessment & Plan Note (Signed)
Chronic. Stable.  On Crestor.  Not fasting at visit today.  Will draw labs at future visit.  Continue with current regimen.

## 2020-07-01 NOTE — Assessment & Plan Note (Signed)
Chronic.  Stable.  Continue to follow up with Pulmonology.  Continue to follow up per their recommendations.

## 2020-07-01 NOTE — Progress Notes (Signed)
Microalbumin shows that your kidneys are excreting protein in your urine.  We will continue to monitor this and getting your A1c to goal will help with this.

## 2020-07-01 NOTE — Assessment & Plan Note (Signed)
Chronic.  Uncontrolled. Will begin Trazodone nightly for sleep.  Discussed that Xanax is not meant to be used as a sleep aid and the risks of taking it for sleep.  Follow up in 2 weeks for reevaluation.

## 2020-07-01 NOTE — Assessment & Plan Note (Addendum)
Chronic. Stable.  On Crestor.  Not fasting at visit today.  Will draw labs at future visit.  Continue with current regimen.

## 2020-07-02 LAB — COMPREHENSIVE METABOLIC PANEL
ALT: 14 IU/L (ref 0–32)
AST: 13 IU/L (ref 0–40)
Albumin/Globulin Ratio: 2.2 (ref 1.2–2.2)
Albumin: 4.7 g/dL (ref 3.8–4.8)
Alkaline Phosphatase: 89 IU/L (ref 44–121)
BUN/Creatinine Ratio: 15 (ref 12–28)
BUN: 9 mg/dL (ref 8–27)
Bilirubin Total: 0.3 mg/dL (ref 0.0–1.2)
CO2: 23 mmol/L (ref 20–29)
Calcium: 9.9 mg/dL (ref 8.7–10.3)
Chloride: 103 mmol/L (ref 96–106)
Creatinine, Ser: 0.61 mg/dL (ref 0.57–1.00)
Globulin, Total: 2.1 g/dL (ref 1.5–4.5)
Glucose: 142 mg/dL — ABNORMAL HIGH (ref 65–99)
Potassium: 4.4 mmol/L (ref 3.5–5.2)
Sodium: 142 mmol/L (ref 134–144)
Total Protein: 6.8 g/dL (ref 6.0–8.5)
eGFR: 97 mL/min/{1.73_m2} (ref >59)

## 2020-07-02 LAB — HEMOGLOBIN A1C
Est. average glucose Bld gHb Est-mCnc: 189 mg/dL
Hgb A1c MFr Bld: 8.2 % — ABNORMAL HIGH (ref 4.8–5.6)

## 2020-07-04 NOTE — Progress Notes (Signed)
Hi Tammy Guerra.  It was a pleasure meeting you last week.  As you can see, your A1c is elevated from your last one.  We have already taken the steps to combat this by starting the Ozempic.  If needed, we can increase the dose to help get you to goal.  In the meantime, make sure you are decreasing your carbohydrates in your diet.  I will see you in a month.

## 2020-07-13 NOTE — Progress Notes (Signed)
BP 138/80   Pulse 68   Temp 98.1 F (36.7 C)   Wt 188 lb (85.3 kg)   SpO2 95%   BMI 31.28 kg/m    Subjective:    Patient ID: Tammy Guerra, female    DOB: [birth year], 69 y.o.   MRN: [id number]  HPI: Tammy Guerra is a 70 y.o. female  Chief Complaint  Patient presents with  . Depression  . Anxiety   INSOMNIA Patient has been using the Trazodone for sleep.  Does not use it every night, only when she has difficulty falling asleep.  Patient has not been using Xanax for sleep.  At times, she uses 2 tabs of Trazodone.  Duration: years Satisfied with sleep quality: yes Difficulty falling asleep: yes Difficulty staying asleep: no  DIABETES Sugars 118-140.  Prior to starting Ozempic sugars were running 140-160s. Patient is tolerating Ozempic well.  Has questions concerning side effects.  Denies any GI distress related to the medication.  Denies polyuria and polydipsia.  ANXIETY Patient states her anxiety is controlled most days however she has some family circumstances that cause her anxiety to worsen.  This is when she uses the Xanax.  In the last two weeks she has used it 5-6 times.    Relevant past medical, surgical, family and social history reviewed and updated as indicated. Interim medical history since our last visit reviewed. Allergies and medications reviewed and updated.  Review of Systems  Endocrine: Negative for polydipsia and polyuria.  Psychiatric/Behavioral: Positive for sleep disturbance. The patient is nervous/anxious.     Per HPI unless specifically indicated above     Objective:    BP 138/80   Pulse 68   Temp 98.1 F (36.7 C)   Wt 188 lb (85.3 kg)   SpO2 95%   BMI 31.28 kg/m   Wt Readings from Last 3 Encounters:  07/14/20 188 lb (85.3 kg)  07/01/20 191 lb 4 oz (86.8 kg)  09/24/19 190 lb (86.2 kg)    Physical Exam Vitals and nursing note reviewed.  Constitutional:      General: She is not in acute distress.    Appearance: Normal  appearance. She is normal weight. She is not ill-appearing, toxic-appearing or diaphoretic.  HENT:     Head: Normocephalic.     Right Ear: External ear normal.     Left Ear: External ear normal.     Nose: Nose normal.     Mouth/Throat:     Mouth: Mucous membranes are moist.     Pharynx: Oropharynx is clear.  Eyes:     General:        Right eye: No discharge.        Left eye: No discharge.     Extraocular Movements: Extraocular movements intact.     Conjunctiva/sclera: Conjunctivae normal.     Pupils: Pupils are equal, round, and reactive to light.  Cardiovascular:     Rate and Rhythm: Normal rate and regular rhythm.     Heart sounds: No murmur heard.   Pulmonary:     Effort: Pulmonary effort is normal. No respiratory distress.     Breath sounds: Normal breath sounds. No wheezing or rales.  Musculoskeletal:     Cervical back: Normal range of motion and neck supple.  Skin:    General: Skin is warm and dry.     Capillary Refill: Capillary refill takes less than 2 seconds.  Neurological:     General: No focal deficit present.  Mental Status: She is alert and oriented to person, place, and time. Mental status is at baseline.  Psychiatric:        Mood and Affect: Mood normal.        Behavior: Behavior normal.        Thought Content: Thought content normal.        Judgment: Judgment normal.     Results for orders placed or performed in visit on 07/14/20  HM COLONOSCOPY  Result Value Ref Range   HM Colonoscopy See Report (in chart) See Report (in chart), Patient Reported      Assessment & Plan:   Problem List Items Addressed This Visit      Cardiovascular and Mediastinum   Coronary artery disease    Chronic.  Controlled.  Observed on Lung CT.  Recommend patient see Cardiology. Patient has strong family history of cardiac disease.        Relevant Orders   Ambulatory referral to Cardiology     Endocrine   Uncontrolled type 2 diabetes mellitus with hyperglycemia  (HCC)    Chronic.  Ongoing.  Tolerating Ozempic well. Continue with 0.25mg  weekly.  Continue to monitor fasting blood sugars.  Will increase Ozempic at next visit if patient continues to tolerate the medication.  Goal A1c is 6.5%.        Other   Generalized anxiety disorder    Chronic.  Ongoing.  Goal is to use Xanax sparingly.  Discussed with patient that 30 Xanax should last her 3 months.  If needing more than that, will begin maintenance medication.  Patient has 8-10 Xanax left.  Can request refill once out of medication.       Insomnia    Chronic.  Controlled.  Continue with Trazodone PRN for sleep. Will adjust medications as needed.        Other Visit Diagnoses    Encounter for screening for osteoporosis    -  Primary   Relevant Orders   DG Bone Density   Encounter for screening mammogram for malignant neoplasm of breast       Relevant Orders   MM DIGITAL SCREENING BILATERAL       Follow up plan: Return in about 2 months (around 09/13/2020) for HTN, HLD, DM2 FU.

## 2020-07-14 ENCOUNTER — Encounter: Payer: Self-pay | Admitting: Nurse Practitioner

## 2020-07-14 ENCOUNTER — Ambulatory Visit (INDEPENDENT_AMBULATORY_CARE_PROVIDER_SITE_OTHER): Payer: Medicare HMO | Admitting: Nurse Practitioner

## 2020-07-14 ENCOUNTER — Other Ambulatory Visit: Payer: Self-pay

## 2020-07-14 VITALS — BP 138/80 | HR 68 | Temp 98.1°F | Wt 188.0 lb

## 2020-07-14 DIAGNOSIS — G4700 Insomnia, unspecified: Secondary | ICD-10-CM | POA: Diagnosis not present

## 2020-07-14 DIAGNOSIS — I2510 Atherosclerotic heart disease of native coronary artery without angina pectoris: Secondary | ICD-10-CM

## 2020-07-14 DIAGNOSIS — Z1231 Encounter for screening mammogram for malignant neoplasm of breast: Secondary | ICD-10-CM

## 2020-07-14 DIAGNOSIS — R69 Illness, unspecified: Secondary | ICD-10-CM | POA: Diagnosis not present

## 2020-07-14 DIAGNOSIS — E1165 Type 2 diabetes mellitus with hyperglycemia: Secondary | ICD-10-CM

## 2020-07-14 DIAGNOSIS — Z13820 Encounter for screening for osteoporosis: Secondary | ICD-10-CM | POA: Diagnosis not present

## 2020-07-14 DIAGNOSIS — F411 Generalized anxiety disorder: Secondary | ICD-10-CM

## 2020-07-14 NOTE — Assessment & Plan Note (Addendum)
Chronic.  Ongoing.  Goal is to use Xanax sparingly.  Discussed with patient that 30 Xanax should last her 3 months.  If needing more than that, will begin maintenance medication.  Patient has 8-10 Xanax left.  Can request refill once out of medication.

## 2020-07-14 NOTE — Assessment & Plan Note (Signed)
Chronic.  Ongoing.  Tolerating Ozempic well. Continue with 0.25mg  weekly.  Continue to monitor fasting blood sugars.  Will increase Ozempic at next visit if patient continues to tolerate the medication.  Goal A1c is 6.5%.

## 2020-07-14 NOTE — Assessment & Plan Note (Signed)
Chronic.  Controlled.  Observed on Lung CT.  Recommend patient see Cardiology. Patient has strong family history of cardiac disease.

## 2020-07-14 NOTE — Assessment & Plan Note (Signed)
Chronic.  Controlled.  Continue with Trazodone PRN for sleep. Will adjust medications as needed.

## 2020-07-20 ENCOUNTER — Other Ambulatory Visit: Payer: Self-pay | Admitting: Family Medicine

## 2020-07-20 DIAGNOSIS — N200 Calculus of kidney: Secondary | ICD-10-CM

## 2020-07-21 ENCOUNTER — Ambulatory Visit: Payer: Self-pay | Admitting: Urology

## 2020-07-26 ENCOUNTER — Other Ambulatory Visit: Payer: Self-pay | Admitting: Nurse Practitioner

## 2020-07-26 NOTE — Telephone Encounter (Signed)
   Notes to clinic:  Patient is requesting a 90 day supply    Requested Prescriptions  Pending Prescriptions Disp Refills   traZODone (DESYREL) 50 MG tablet [Pharmacy Med Name: TRAZODONE 50 MG TABLET] 90 tablet 1    Sig: TAKE 1 TABLET (50 MG TOTAL) BY MOUTH AT BEDTIME AS NEEDED FOR SLEEP (CAN TAKE 1-2 TABS AS NEEDED ).      Psychiatry: Antidepressants - Serotonin Modulator Passed - 07/26/2020 11:33 AM      Passed - Valid encounter within last 6 months    Recent Outpatient Visits           1 week ago Encounter for screening for osteoporosis   [facility] [name], NP   3 weeks ago Aortic atherosclerosis [facility])   [facility] [name], NP       Future Appointments             In 6 days  [facility], [place]   In 1 month [name], NP [name], [place]   In 1 month [place], [name], MD [place]

## 2020-07-26 NOTE — Telephone Encounter (Signed)
Routing to provider  

## 2020-07-27 ENCOUNTER — Other Ambulatory Visit: Payer: Self-pay | Admitting: Nurse Practitioner

## 2020-07-27 MED ORDER — ALPRAZOLAM 0.25 MG PO TABS: 0.2500 mg | ORAL_TABLET | ORAL | 0 refills | Status: DC | PRN

## 2020-07-27 NOTE — Telephone Encounter (Signed)
Medication Refill - Medication: ALPRAZolam (XANAX) 0.25 MG tablet Pt will be going out of town on 4.16.22 and would like refill before then se advise   Has the patient contacted their pharmacy? No. (Agent: If no, request that the patient contact the pharmacy for the refill.) (Agent: If yes, when and what did the pharmacy advise?)  Preferred Pharmacy (with phone number or street name):[id number] - [place], [place] - [address]  [address] [place] [address]  Phone:  [phone] Fax:  [phone]   Agent: Please be advised that RX refills may take up to 3 business days. We ask that you follow-up with your pharmacy.

## 2020-07-27 NOTE — Telephone Encounter (Signed)
PMP reviewed

## 2020-07-27 NOTE — Telephone Encounter (Signed)
Requested medication (s) are due for refill today: yes  Requested medication (s) are on the active medication list: yes  Last refill:  ?  Future visit scheduled: yes  Notes to clinic:  not delegated    Requested Prescriptions  Pending Prescriptions Disp Refills   ALPRAZolam (XANAX) 0.25 MG tablet 30 tablet       Not Delegated - Psychiatry:  Anxiolytics/Hypnotics Failed - 07/27/2020 10:57 AM      Failed - This refill cannot be delegated      Failed - Urine Drug Screen completed in last 360 days      Passed - Valid encounter within last 6 months    Recent Outpatient Visits           1 week ago Encounter for screening for osteoporosis   [facility] [name], NP   3 weeks ago Aortic atherosclerosis ([place])   [facility] [name], NP       Future Appointments             In 5 days  [facility], [place]   In 4 weeks [place], Kathlene November, MD [facility], [place]   In 1 month [name], NP [name], [place]   In 1 month [place], [name], [place]

## 2020-08-01 ENCOUNTER — Ambulatory Visit (INDEPENDENT_AMBULATORY_CARE_PROVIDER_SITE_OTHER): Payer: Medicare HMO

## 2020-08-01 VITALS — Ht 65.0 in | Wt 185.0 lb

## 2020-08-01 DIAGNOSIS — Z0000 Encounter for general adult medical examination without abnormal findings: Secondary | ICD-10-CM

## 2020-08-01 NOTE — Progress Notes (Signed)
I connected with Syra Sirmons today by telephone and verified that I am speaking with the correct person using two identifiers. Location patient: home Location provider: work Persons participating in the virtual visit: Yissel, [place]ehl LPN.   I discussed the limitations, risks, security and privacy concerns of performing an evaluation and management service by telephone and the availability of in person appointments. I also discussed with the patient that there may be a patient responsible charge related to this service. The patient expressed understanding and verbally consented to this telephonic visit.    Interactive audio and video telecommunications were attempted between this provider and patient, however failed, due to patient having technical difficulties OR patient did not have access to video capability.  We continued and completed visit with audio only.     Vital signs may be patient reported or missing.  Subjective:   Tammy Guerra is a 70 y.o. female who presents for Medicare Annual (Subsequent) preventive examination.  Review of Systems     Cardiac Risk Factors include: advanced age (>32men, >28 women);diabetes mellitus;dyslipidemia;obesity (BMI >30kg/m2);smoking/ tobacco exposure;sedentary lifestyle     Objective:    Today's Vitals   08/01/20 1026  Weight: 185 lb (83.9 kg)  Height: 5\' 5"  (1.651 m)   Body mass index is 30.79 kg/m.  Advanced Directives 08/01/2020 08/20/2019 05/27/2018 05/30/2017 05/23/2017  Does Patient Have a Medical Advance Directive? No No No No No  Would patient like information on creating a medical advance directive? - No - Patient declined Yes (MAU/Ambulatory/Procedural Areas - Information given) No - Patient declined -    Current Medications (verified) Outpatient Encounter Medications as of 08/01/2020  Medication Sig  . ALPRAZolam (XANAX) 0.25 MG tablet Take 1 tablet (0.25 mg total) by mouth as needed for anxiety.  . metFORMIN  (GLUCOPHAGE-XR) 500 MG 24 hr tablet Take 2 tablets by mouth daily with supper.  . rosuvastatin (CRESTOR) 20 MG tablet Take 20 mg by mouth daily.  . Semaglutide,0.25 or 0.5MG /DOS, (OZEMPIC, 0.25 OR 0.5 MG/DOSE,) 2 MG/1.5ML SOPN Inject 0.25 mg into the skin once a week. Start with 0.25MG  once a week x 4 weeks, then increase to 0.5MG  weekly.  . traZODone (DESYREL) 50 MG tablet TAKE 1 TABLET (50 MG TOTAL) BY MOUTH AT BEDTIME AS NEEDED FOR SLEEP (CAN TAKE 1-2 TABS AS NEEDED ).   No facility-administered encounter medications on file as of 08/01/2020.    Allergies (verified) Patient has no known allergies.   History: Past Medical History:  Diagnosis Date  . Anxiety   . Arthritis   . Cancer [facility])    skin/CERVICAL CA  . Complication of anesthesia    discomfort during first cataract  . Diabetes mellitus without complication ([place])   . Emphysema lung ([place]) 07/01/2020  . GERD (gastroesophageal reflux disease)    NO MEDS  . History of kidney stones    STONES AND CYSTS  . Kidney stone   . Palpitations   . Pancreatic cyst    Past Surgical History:  Procedure Laterality Date  . ABDOMINAL HYSTERECTOMY    . CARDIAC CATHETERIZATION    . CATARACT EXTRACTION W/PHACO Left 09/07/2014   Procedure: CATARACT EXTRACTION PHACO AND INTRAOCULAR LENS PLACEMENT (IOC);  Surgeon: Birder Robson, MD;  Location: [facility] ORS;  Service: Ophthalmology;  Laterality: Left;  [place] 01:03 AP% 27.7 CDE 17.55  . CORONARY ANGIOPLASTY    . EYE SURGERY     cataract  . KNEE ARTHROSCOPY WITH MEDIAL MENISECTOMY Right 05/30/2017   Procedure: KNEE  ARTHROSCOPY WITH MEDIAL AND LATERAL  MENISECTOMY;  Surgeon: Hessie Knows, MD;  Location: [facility] ORS;  Service: Orthopedics;  Laterality: Right;  . SYNOVECTOMY Right 05/30/2017   Procedure: SYNOVECTOMY;  Surgeon: Hessie Knows, MD;  Location: [facility] ORS;  Service: Orthopedics;  Laterality: Right;   Family History  Problem Relation Age of Onset  . Diabetes Father   . Alcohol abuse Father   .  Early death Father   . Heart disease Father   . Stroke Father   . Diabetes Brother   . Alcohol abuse Brother   . Early death Brother   . Heart disease Brother   . Stroke Brother   . Diabetes Brother   . Cancer Brother   . Early death Brother   . Early death Brother   . Cancer Paternal Aunt        Breast  . Breast cancer Neg Hx    Social History   Socioeconomic History  . Marital status: Widowed    Spouse name: Not on file  . Number of children: Not on file  . Years of education: Not on file  . Highest education level: Not on file  Occupational History  . Not on file  Tobacco Use  . Smoking status: Light Tobacco Smoker    Packs/day: 0.50    Years: 41.00    Pack years: 20.50    Types: Cigarettes  . Smokeless tobacco: Never Used  . Tobacco comment: 15 CIG DAILY  Vaping Use  . Vaping Use: Never used  Substance and Sexual Activity  . Alcohol use: No  . Drug use: No  . Sexual activity: Not Currently    Birth control/protection: Surgical  Other Topics Concern  . Not on file  Social History Narrative  . Not on file   Social Determinants of Health   Financial Resource Strain: Low Risk   . Difficulty of Paying Living Expenses: Not very hard  Food Insecurity: No Food Insecurity  . Worried About Charity fundraiser in the Last Year: Never true  . Ran Out of Food in the Last Year: Never true  Transportation Needs: No Transportation Needs  . Lack of Transportation (Medical): No  . Lack of Transportation (Non-Medical): No  Physical Activity: Inactive  . Days of Exercise per Week: 0 days  . Minutes of Exercise per Session: 0 min  Stress: Stress Concern Present  . Feeling of Stress : Very much  Social Connections: Not on file    Tobacco Counseling Ready to quit: Yes Counseling given: Not Answered Comment: 15 CIG DAILY   Clinical Intake:  Pre-visit preparation completed: Yes  Pain : No/denies pain     Nutritional Status: BMI > 30  Obese Nutritional Risks:  None Diabetes: Yes  How often do you need to have someone help you when you read instructions, pamphlets, or other written materials from your doctor or pharmacy?: 1 - Never What is the last grade level you completed in school?: 12th grade  Diabetic? Yes Nutrition Risk Assessment:  Has the patient had any N/V/D within the last 2 months?  No  Does the patient have any non-healing wounds?  No  Has the patient had any unintentional weight loss or weight gain?  No   Diabetes:  Is the patient diabetic?  Yes  If diabetic, was a CBG obtained today?  No  Did the patient bring in their glucometer from home?  No  How often do you monitor your CBG's? daily.   Financial Strains  and Diabetes Management:  Are you having any financial strains with the device, your supplies or your medication? No .  Does the patient want to be seen by Chronic Care Management for management of their diabetes?  No  Would the patient like to be referred to a Nutritionist or for Diabetic Management?  No   Diabetic Exams:  Diabetic Eye Exam: Overdue for diabetic eye exam. Pt has been advised about the importance in completing this exam. Patient advised to call and schedule an eye exam. Diabetic Foot Exam: Overdue, Pt has been advised about the importance in completing this exam. Pt is scheduled for diabetic foot exam on next appointment.   Interpreter Needed?: No  Information entered by :: NAllen LPN   Activities of Daily Living In your present state of health, do you have any difficulty performing the following activities: 08/01/2020 07/01/2020  Hearing? N N  Vision? N N  Difficulty concentrating or making decisions? N N  Walking or climbing stairs? Y Y  Comment due to knees Leg issues  Dressing or bathing? N N  Doing errands, shopping? N N  Preparing Food and eating ? N -  Using the Toilet? N -  In the past six months, have you accidently leaked urine? Y -  Do you have problems with loss of bowel control?  N -  Managing your Medications? N -  Managing your Finances? N -  Housekeeping or managing your Housekeeping? N -  Some recent data might be hidden    Patient Care Team: [name], NP as PCP - General (Nurse Practitioner)  Indicate any recent Medical Services you may have received from other than [facility] providers in the past year (date may be approximate).     Assessment:   This is a routine wellness examination for Tammy Guerra.  Hearing/Vision screen  Hearing Screening   125Hz  250Hz  500Hz  1000Hz  2000Hz  3000Hz  4000Hz  6000Hz  8000Hz   Right ear:           Left ear:           Vision Screening Comments: Regular eye exams, [facility]  Dietary issues and exercise activities discussed: Current Exercise Habits: The patient does not participate in regular exercise at present  Goals    . Patient Stated     08/01/2020, wants to weigh 155-159 and quit smoking      Depression Screen PHQ 2/9 Scores 08/01/2020 07/14/2020 07/01/2020 08/20/2019  PHQ - 2 Score 1 1 3 6   PHQ- 9 Score - 5 10 16     Fall Risk Fall Risk  08/01/2020 07/01/2020 08/20/2019  Falls in the past year? 0 0 0  Number falls in past yr: - 0 -  Injury with Fall? - 0 -  Risk for fall due to : Medication side effect - -  Follow up Falls evaluation completed;Education provided;Falls prevention discussed - -    FALL RISK PREVENTION PERTAINING TO THE HOME:  Any stairs in or around the home? Yes  If so, are there any without handrails? No  Home free of loose throw rugs in walkways, pet beds, electrical cords, etc? Yes  Adequate lighting in your home to reduce risk of falls? Yes   ASSISTIVE DEVICES UTILIZED TO PREVENT FALLS:  Life alert? No  Use of a cane, walker or w/c? No  Grab bars in the bathroom? No  Shower chair or bench in shower? Yes  Elevated toilet seat or a handicapped toilet? No   TIMED UP AND GO:  Was  the test performed? No . .   Cognitive Function:     6CIT Screen 08/01/2020  What Year? 0  points  What month? 0 points  What time? 0 points  Count back from 20 0 points  Months in reverse 0 points  Repeat phrase 0 points  Total Score 0    Immunizations Immunization History  Administered Date(s) Administered  . PFIZER(Purple Top)SARS-COV-2 Vaccination 06/18/2019, 07/09/2019    TDAP status: Due, Education has been provided regarding the importance of this vaccine. Advised may receive this vaccine at local pharmacy or Health Dept. Aware to provide a copy of the vaccination record if obtained from local pharmacy or Health Dept. Verbalized acceptance and understanding.  Flu Vaccine status: Declined, Education has been provided regarding the importance of this vaccine but patient still declined. Advised may receive this vaccine at local pharmacy or Health Dept. Aware to provide a copy of the vaccination record if obtained from local pharmacy or Health Dept. Verbalized acceptance and understanding.  Pneumococcal vaccine status: Declined,  Education has been provided regarding the importance of this vaccine but patient still declined. Advised may receive this vaccine at local pharmacy or Health Dept. Aware to provide a copy of the vaccination record if obtained from local pharmacy or Health Dept. Verbalized acceptance and understanding.   Covid-19 vaccine status: Completed vaccines  Qualifies for Shingles Vaccine? Yes   Zostavax completed No   Shingrix Completed?: No.    Education has been provided regarding the importance of this vaccine. Patient has been advised to call insurance company to determine out of pocket expense if they have not yet received this vaccine. Advised may also receive vaccine at local pharmacy or Health Dept. Verbalized acceptance and understanding.  Screening Tests Health Maintenance  Topic Date Due  . FOOT EXAM  Never done  . OPHTHALMOLOGY EXAM  Never done  . DEXA SCAN  Never done  . COVID-19 Vaccine (3 - Pfizer risk 4-dose series) 08/06/2019  .  Hepatitis C Screening  07/01/2021 (Originally Feb 13, 1951)  . PNA vac Low Risk Adult (1 of 2 - PCV13) 07/01/2021 (Originally 04/25/2017)  . TETANUS/TDAP  07/14/2021 (Originally 04/26/1969)  . INFLUENZA VACCINE  11/21/2020  . HEMOGLOBIN A1C  01/01/2021  . URINE MICROALBUMIN  07/01/2021  . MAMMOGRAM  09/21/2021  . COLONOSCOPY (Pts 45-35yrs Insurance coverage will need to be confirmed)  11/16/2029  . HPV VACCINES  Aged Out    Health Maintenance  Health Maintenance Due  Topic Date Due  . FOOT EXAM  Never done  . OPHTHALMOLOGY EXAM  Never done  . DEXA SCAN  Never done  . COVID-19 Vaccine (3 - Pfizer risk 4-dose series) 08/06/2019    Colorectal cancer screening: Type of screening: Colonoscopy. Completed 11/17/2019. Repeat every 10 years  Mammogram status: Completed 09/2019. Repeat every year  Bone Density status: scheduled in July  Lung Cancer Screening: (Low Dose CT Chest recommended if Age 37-80 years, 30 pack-year currently smoking OR have quit w/in 15years.) does not qualify.   Lung Cancer Screening Referral: no  Additional Screening:  Hepatitis C Screening: does qualify; due  Vision Screening: Recommended annual ophthalmology exams for early detection of glaucoma and other disorders of the eye. Is the patient up to date with their annual eye exam?  No  Who is the provider or what is the name of the office in which the patient attends annual eye exams? [facility] If pt is not established with a provider, would they like to be referred  to a provider to establish care? No .   Dental Screening: Recommended annual dental exams for proper oral hygiene  Community Resource Referral / Chronic Care Management: CRR required this visit?  No   CCM required this visit?  No      Plan:     I have personally reviewed and noted the following in the patient's chart:   . Medical and social history . Use of alcohol, tobacco or illicit drugs  . Current medications and  supplements . Functional ability and status . Nutritional status . Physical activity . Advanced directives . List of other physicians . Hospitalizations, surgeries, and ER visits in previous 12 months . Vitals . Screenings to include cognitive, depression, and falls . Referrals and appointments  In addition, I have reviewed and discussed with patient certain preventive protocols, quality metrics, and best practice recommendations. A written personalized care plan for preventive services as well as general preventive health recommendations were provided to patient.     Kellie Simmering, LPN   11/20/8567   Nurse Notes:

## 2020-08-01 NOTE — Patient Instructions (Signed)
Tammy Guerra , Thank you for taking time to come for your Medicare Wellness Visit. I appreciate your ongoing commitment to your health goals. Please review the following plan we discussed and let me know if I can assist you in the future.   Screening recommendations/referrals: Colonoscopy: completed 11/17/2019 Mammogram: completed 09/22/2019 Bone Density: scheduled Recommended yearly ophthalmology/optometry visit for glaucoma screening and checkup Recommended yearly dental visit for hygiene and checkup  Vaccinations: Influenza vaccine: decline Pneumococcal vaccine: decline Tdap vaccine: completed 04/17/2012, due 04/17/2022 Shingles vaccine: discussed   Covid-19: 07/09/2019, 06/18/2019  Advanced directives: Advance directive discussed with you today.   Conditions/risks identified: smoking  Next appointment: Follow up in one year for your annual wellness visit    Preventive Care 62 Years and Older, Female Preventive care refers to lifestyle choices and visits with your health care provider that can promote health and wellness. What does preventive care include?  A yearly physical exam. This is also called an annual well check.  Dental exams once or twice a year.  Routine eye exams. Ask your health care provider how often you should have your eyes checked.  Personal lifestyle choices, including:  Daily care of your teeth and gums.  Regular physical activity.  Eating a healthy diet.  Avoiding tobacco and drug use.  Limiting alcohol use.  Practicing safe sex.  Taking low-dose aspirin every day.  Taking vitamin and mineral supplements as recommended by your health care provider. What happens during an annual well check? The services and screenings done by your health care provider during your annual well check will depend on your age, overall health, lifestyle risk factors, and family history of disease. Counseling  Your health care provider may ask you questions about  your:  Alcohol use.  Tobacco use.  Drug use.  Emotional well-being.  Home and relationship well-being.  Sexual activity.  Eating habits.  History of falls.  Memory and ability to understand (cognition).  Work and work Statistician.  Reproductive health. Screening  You may have the following tests or measurements:  Height, weight, and BMI.  Blood pressure.  Lipid and cholesterol levels. These may be checked every 5 years, or more frequently if you are over 32 years old.  Skin check.  Lung cancer screening. You may have this screening every year starting at age 90 if you have a 30-pack-year history of smoking and currently smoke or have quit within the past 15 years.  Fecal occult blood test (FOBT) of the stool. You may have this test every year starting at age 22.  Flexible sigmoidoscopy or colonoscopy. You may have a sigmoidoscopy every 5 years or a colonoscopy every 10 years starting at age 19.  Hepatitis C blood test.  Hepatitis B blood test.  Sexually transmitted disease (STD) testing.  Diabetes screening. This is done by checking your blood sugar (glucose) after you have not eaten for a while (fasting). You may have this done every 1-3 years.  Bone density scan. This is done to screen for osteoporosis. You may have this done starting at age 71.  Mammogram. This may be done every 1-2 years. Talk to your health care provider about how often you should have regular mammograms. Talk with your health care provider about your test results, treatment options, and if necessary, the need for more tests. Vaccines  Your health care provider may recommend certain vaccines, such as:  Influenza vaccine. This is recommended every year.  Tetanus, diphtheria, and acellular pertussis (Tdap, Td) vaccine. You may  need a Td booster every 10 years.  Zoster vaccine. You may need this after age 40.  Pneumococcal 13-valent conjugate (PCV13) vaccine. One dose is recommended  after age 14.  Pneumococcal polysaccharide (PPSV23) vaccine. One dose is recommended after age 25. Talk to your health care provider about which screenings and vaccines you need and how often you need them. This information is not intended to replace advice given to you by your health care provider. Make sure you discuss any questions you have with your health care provider. Document Released: 05/06/2015 Document Revised: 12/28/2015 Document Reviewed: 02/08/2015 Elsevier Interactive Patient Education  2017 [employer] Prevention in the Home Falls can cause injuries. They can happen to people of all ages. There are many things you can do to make your home safe and to help prevent falls. What can I do on the outside of my home?  Regularly fix the edges of walkways and driveways and fix any cracks.  Remove anything that might make you trip as you walk through a door, such as a raised step or threshold.  Trim any bushes or trees on the path to your home.  Use bright outdoor lighting.  Clear any walking paths of anything that might make someone trip, such as rocks or tools.  Regularly check to see if handrails are loose or broken. Make sure that both sides of any steps have handrails.  Any raised decks and porches should have guardrails on the edges.  Have any leaves, snow, or ice cleared regularly.  Use sand or salt on walking paths during winter.  Clean up any spills in your garage right away. This includes oil or grease spills. What can I do in the bathroom?  Use night lights.  Install grab bars by the toilet and in the tub and shower. Do not use towel bars as grab bars.  Use non-skid mats or decals in the tub or shower.  If you need to sit down in the shower, use a plastic, non-slip stool.  Keep the floor dry. Clean up any water that spills on the floor as soon as it happens.  Remove soap buildup in the tub or shower regularly.  Attach bath mats securely with  double-sided non-slip rug tape.  Do not have throw rugs and other things on the floor that can make you trip. What can I do in the bedroom?  Use night lights.  Make sure that you have a light by your bed that is easy to reach.  Do not use any sheets or blankets that are too big for your bed. They should not hang down onto the floor.  Have a firm chair that has side arms. You can use this for support while you get dressed.  Do not have throw rugs and other things on the floor that can make you trip. What can I do in the kitchen?  Clean up any spills right away.  Avoid walking on wet floors.  Keep items that you use a lot in easy-to-reach places.  If you need to reach something above you, use a strong step stool that has a grab bar.  Keep electrical cords out of the way.  Do not use floor polish or wax that makes floors slippery. If you must use wax, use non-skid floor wax.  Do not have throw rugs and other things on the floor that can make you trip. What can I do with my stairs?  Do not leave any items on  the stairs.  Make sure that there are handrails on both sides of the stairs and use them. Fix handrails that are broken or loose. Make sure that handrails are as long as the stairways.  Check any carpeting to make sure that it is firmly attached to the stairs. Fix any carpet that is loose or worn.  Avoid having throw rugs at the top or bottom of the stairs. If you do have throw rugs, attach them to the floor with carpet tape.  Make sure that you have a light switch at the top of the stairs and the bottom of the stairs. If you do not have them, ask someone to add them for you. What else can I do to help prevent falls?  Wear shoes that:  Do not have high heels.  Have rubber bottoms.  Are comfortable and fit you well.  Are closed at the toe. Do not wear sandals.  If you use a stepladder:  Make sure that it is fully opened. Do not climb a closed stepladder.  Make  sure that both sides of the stepladder are locked into place.  Ask someone to hold it for you, if possible.  Clearly mark and make sure that you can see:  Any grab bars or handrails.  First and last steps.  Where the edge of each step is.  Use tools that help you move around (mobility aids) if they are needed. These include:  Canes.  Walkers.  Scooters.  Crutches.  Turn on the lights when you go into a dark area. Replace any light bulbs as soon as they burn out.  Set up your furniture so you have a clear path. Avoid moving your furniture around.  If any of your floors are uneven, fix them.  If there are any pets around you, be aware of where they are.  Review your medicines with your doctor. Some medicines can make you feel dizzy. This can increase your chance of falling. Ask your doctor what other things that you can do to help prevent falls. This information is not intended to replace advice given to you by your health care provider. Make sure you discuss any questions you have with your health care provider. Document Released: 02/03/2009 Document Revised: 09/15/2015 Document Reviewed: 05/14/2014 Elsevier Interactive Patient Education  2017 [employer].

## 2020-08-23 NOTE — Progress Notes (Signed)
Cardiology Office Note  Date:  08/24/2020   ID:  Tammy Guerra, DOB [birth year], MRN [id number]  PCP:  [name], NP   Chief Complaint  Patient presents with  . New Patient (Initial Visit)    Ref by [name], NP to establish care for CAD & diabetes with a family Hx. of CAD. Patient c/o shortness of breath, fatigue more than usual and has pain/cramping in legs with walking. Medications reviewed by the patient verbally.     HPI:  [name] is a 70 year old woman with past medical history of Pancreatic cyst Depression/anxiety/insomnia Diabetes Smoker  Referred by [name] for coronary artery disease seen on CT scan  Reports long history of smoking, trying to quit Less than 1 pack/day  Has annual CT scan for lung screening Recent chest CT scan noticing coronary calcification Images pulled up and reviewed  Coronary calcification noted and reviewed with her  Main limitation to exercise,  has chronic leg pain Attributes this to poorly controlled diabetes which she was unaware of Working on her diet, in effort to get her numbers better  But does report she is sedentary at baseline Some mild shortness of breath, nothing beyond expected she feels  Lab work reviewed Total cholesterol 146 LDL 81 A1c 8.2  EKG personally reviewed by myself on todays visit Shows normal sinus rhythm rate 71 bpm no significant ST-T wave changes  PMH:   has a past medical history of Anxiety, Arthritis, Cancer ([place]), Complication of anesthesia, Diabetes mellitus without complication ([place]), Emphysema lung ([place]) (07/01/2020), GERD (gastroesophageal reflux disease), History of kidney stones, Kidney stone, Palpitations, and Pancreatic cyst.  PSH:    Past Surgical History:  Procedure Laterality Date  . ABDOMINAL HYSTERECTOMY    . CARDIAC CATHETERIZATION    . CATARACT EXTRACTION W/PHACO Left 09/07/2014   Procedure: CATARACT EXTRACTION PHACO AND INTRAOCULAR LENS PLACEMENT (IOC);   Surgeon: Birder Robson, MD;  Location: [facility] ORS;  Service: Ophthalmology;  Laterality: Left;  [place] 01:03 AP% 27.7 CDE 17.55  . CORONARY ANGIOPLASTY    . EYE SURGERY     cataract  . KNEE ARTHROSCOPY WITH MEDIAL MENISECTOMY Right 05/30/2017   Procedure: KNEE ARTHROSCOPY WITH MEDIAL AND LATERAL  MENISECTOMY;  Surgeon: Hessie Knows, MD;  Location: [facility] ORS;  Service: Orthopedics;  Laterality: Right;  . SYNOVECTOMY Right 05/30/2017   Procedure: SYNOVECTOMY;  Surgeon: Hessie Knows, MD;  Location: [facility] ORS;  Service: Orthopedics;  Laterality: Right;    Current Outpatient Medications  Medication Sig Dispense Refill  . ALPRAZolam (XANAX) 0.25 MG tablet Take 1 tablet (0.25 mg total) by mouth as needed for anxiety. 30 tablet 0  . metFORMIN (GLUCOPHAGE-XR) 500 MG 24 hr tablet Take 2 tablets by mouth daily with supper.    . rosuvastatin (CRESTOR) 20 MG tablet Take 20 mg by mouth daily.    . Semaglutide,0.25 or 0.5MG /DOS, (OZEMPIC, 0.25 OR 0.5 MG/DOSE,) 2 MG/1.5ML SOPN Inject 0.25 mg into the skin once a week. Start with 0.25MG  once a week x 4 weeks, then increase to 0.5MG  weekly. 6 mL 1  . traZODone (DESYREL) 50 MG tablet TAKE 1 TABLET (50 MG TOTAL) BY MOUTH AT BEDTIME AS NEEDED FOR SLEEP (CAN TAKE 1-2 TABS AS NEEDED ). 90 tablet 1   No current facility-administered medications for this visit.     Allergies:   Patient has no known allergies.   Social History:  The patient  reports that she has been smoking cigarettes. She has a 20.50 pack-year smoking history.  She has never used smokeless tobacco. She reports that she does not drink alcohol and does not use drugs.   Family History:   family history includes Alcohol abuse in her brother and father; Cancer in her brother and paternal aunt; Diabetes in her brother, brother, and father; Early death in her brother, brother, brother, and father; Heart disease in her brother and father; Stroke in her brother and father.    Review of Systems: Review of  Systems  Constitutional: Negative.   HENT: Negative.   Respiratory: Negative.   Cardiovascular: Negative.   Gastrointestinal: Negative.   Musculoskeletal: Negative.        Leg discomfort  Neurological: Negative.   Psychiatric/Behavioral: Negative.   All other systems reviewed and are negative.   PHYSICAL EXAM: VS:  BP 120/70 (BP Location: Right Arm, Patient Position: Sitting, Cuff Size: Normal)   Pulse 71   Ht 5\' 5"  (1.651 m)   Wt 183 lb (83 kg)   SpO2 98%   BMI 30.45 kg/m  , BMI Body mass index is 30.45 kg/m. GEN: Well nourished, well developed, in no acute distress HEENT: normal Neck: no JVD, carotid bruits, or masses Cardiac: RRR; no murmurs, rubs, or gallops,no edema  Respiratory:  clear to auscultation bilaterally, normal work of breathing GI: soft, nontender, nondistended, + BS MS: no deformity or atrophy Skin: warm and dry, no rash Neuro:  Strength and sensation are intact Psych: euthymic mood, full affect  Recent Labs: 07/01/2020: ALT 14; BUN 9; Creatinine, Ser 0.61; Potassium 4.4; Sodium 142    Lipid Panel No results found for: CHOL, HDL, LDLCALC, TRIG    Wt Readings from Last 3 Encounters:  08/24/20 183 lb (83 kg)  08/01/20 185 lb (83.9 kg)  07/14/20 188 lb (85.3 kg)     ASSESSMENT AND PLAN:  Problem List Items Addressed This Visit      Cardiology Problems   Coronary artery disease   Relevant Medications   ezetimibe (ZETIA) 10 MG tablet   Other Relevant Orders   EKG 12-Lead   Aortic atherosclerosis (HCC) - Primary   Relevant Medications   ezetimibe (ZETIA) 10 MG tablet   Other Relevant Orders   EKG 12-Lead   PAD (peripheral artery disease) (HCC)   Relevant Medications   ezetimibe (ZETIA) 10 MG tablet   Other Relevant Orders   EKG 12-Lead   VAS [place] LOWER EXTREMITY ARTERIAL DUPLEX     Other   Emphysema lung ([place])    Other Visit Diagnoses    Claudication in peripheral vascular disease ([place])       Relevant Orders   VAS [place] LOWER EXTREMITY  ARTERIAL DUPLEX     Coronary artery disease Noted on CT scan, denies anginal symptoms Images from CT pulled up and reviewed Smoking cessation recommended, Continue Crestor, add Zetia 10 mg daily Recommended she call us for any symptoms concerning for angina, stress testing or cardiac CTA could be performed  Smoker We have encouraged her to continue to work on weaning her cigarettes and smoking cessation. She will continue to work on this and does not want any assistance with chantix.   Leg pain Unable to exclude peripheral arterial disease, lower extremity arterial Doppler ordered  Hyperlipidemia Recommend she add Zetia to her Crestor to achieve goal LDL less than 70  Diabetes type 2 with complications Working with new primary care on getting her numbers down Was unaware of high numbers April 2021, most recent A1c 7.7 up to 8.2    Total  encounter time more than 60 minutes  Greater than 50% was spent in counseling and coordination of care with the patient    Signed, Esmond Plants, M.D., Ph.D. [place], [place]

## 2020-08-24 ENCOUNTER — Ambulatory Visit (INDEPENDENT_AMBULATORY_CARE_PROVIDER_SITE_OTHER): Payer: Medicare HMO | Admitting: Cardiovascular Disease

## 2020-08-24 ENCOUNTER — Encounter: Payer: Self-pay | Admitting: Cardiovascular Disease

## 2020-08-24 ENCOUNTER — Other Ambulatory Visit: Payer: Self-pay

## 2020-08-24 VITALS — BP 120/70 | HR 71 | Ht 65.0 in | Wt 183.0 lb

## 2020-08-24 DIAGNOSIS — I700 Atherosclerosis of aorta: Secondary | ICD-10-CM

## 2020-08-24 DIAGNOSIS — I25118 Atherosclerotic heart disease of native coronary artery with other forms of angina pectoris: Secondary | ICD-10-CM

## 2020-08-24 DIAGNOSIS — I739 Peripheral vascular disease, unspecified: Secondary | ICD-10-CM

## 2020-08-24 DIAGNOSIS — J439 Emphysema, unspecified: Secondary | ICD-10-CM | POA: Diagnosis not present

## 2020-08-24 MED ORDER — EZETIMIBE 10 MG PO TABS: 10.0000 mg | ORAL_TABLET | Freq: Every day | ORAL | 3 refills | Status: DC

## 2020-08-24 NOTE — Patient Instructions (Addendum)
Medication Instructions:  Add zetia 10 mg daily for cholesterol  Medication was sent in to [facility]  If you need a refill on your cardiac medications before your next appointment, please call your pharmacy.    Lab work: No new labs needed  Testing/Procedures: Lower extremity arterial duplex for claudication, PAD   Follow-Up: At [facility], you and your health needs are our priority.  As part of our continuing mission to provide you with exceptional heart care, we have created designated Provider Care Teams.  These Care Teams include your primary Cardiologist (physician) and Advanced Practice Providers (APPs -  Physician Assistants and Nurse Practitioners) who all work together to provide you with the care you need, when you need it.  . You will need a follow up appointment in 12 months  . Providers on your designated Care Team:   . Murray Hodgkins, NP . Christell Faith, PA-C . Marrianne Mood, PA-C   COVID-19 Vaccine Information can be found at: [email or web address].co.uk For questions related to vaccine distribution or appointments, please email vaccine@[email or web address] or call [phone].

## 2020-08-29 ENCOUNTER — Ambulatory Visit (INDEPENDENT_AMBULATORY_CARE_PROVIDER_SITE_OTHER): Payer: Medicare HMO | Admitting: Nurse Practitioner

## 2020-08-29 ENCOUNTER — Encounter: Payer: Self-pay | Admitting: Nurse Practitioner

## 2020-08-29 ENCOUNTER — Ambulatory Visit: Payer: Self-pay | Admitting: *Deleted

## 2020-08-29 ENCOUNTER — Other Ambulatory Visit: Payer: Self-pay

## 2020-08-29 VITALS — BP 138/80 | HR 73 | Temp 98.8°F | Wt 183.0 lb

## 2020-08-29 DIAGNOSIS — R102 Pelvic and perineal pain: Secondary | ICD-10-CM

## 2020-08-29 DIAGNOSIS — N3001 Acute cystitis with hematuria: Secondary | ICD-10-CM

## 2020-08-29 MED ORDER — CIPROFLOXACIN HCL 500 MG PO TABS: 500.0000 mg | ORAL_TABLET | Freq: Two times a day (BID) | ORAL | 0 refills | Status: AC

## 2020-08-29 MED ORDER — FLUCONAZOLE 150 MG PO TABS: 150.0000 mg | ORAL_TABLET | Freq: Once | ORAL | 0 refills | Status: AC

## 2020-08-29 NOTE — Telephone Encounter (Signed)
Pt called with complaints of lower right abdominal pain that started on 08/26/20; it started radiating toward her back on 08/29/20; her pain constant and is sharp; the pain is rated 6-7 out of 10; she has intermittent nausea since 08/28/20 and intermittent lightheadedness since 08/26/20; recommendations made per nurse triage protocol; she verbalized understanding but says she does not want to go to the ED; she would like to be seen by [name] at [facility]; the pt can be contacted at [phone]; will route to office for final disposition. Reason for Disposition . [1] SEVERE pain AND [2] age > 60 years  Answer Assessment - Initial Assessment Questions 1. LOCATION: "Where does it hurt?"      Right lower abdomen 2. RADIATION: "Does the pain shoot anywhere else?" (e.g., chest, back)     Right back 3. ONSET: "When did the pain begin?" (e.g., minutes, hours or days ago)     08/26/20 4. SUDDEN: "Gradual or sudden onset?"     gradual 5. PATTERN "Does the pain come and go, or is it constant?"    - If constant: "Is it getting better, staying the same, or worsening?"      (Note: Constant means the pain never goes away completely; most serious pain is constant and it progresses)     - If intermittent: "How long does it last?" "Do you have pain now?"     (Note: Intermittent means the pain goes away completely between bouts)     constant 6. SEVERITY: "How bad is the pain?"  (e.g., Scale 1-10; mild, moderate, or severe)   - MILD (1-3): doesn't interfere with normal activities, abdomen soft and not tender to touch    - MODERATE (4-7): interferes with normal activities or awakens from sleep, abdomen tender to touch    - SEVERE (8-10): excruciating pain, doubled over, unable to do any normal activities      6 out of 10 7. RECURRENT SYMPTOM: "Have you ever had this type of stomach pain before?" If Yes, ask: "When was the last time?" and "What happened that time?"     no 8. CAUSE: "What do you  think is causing the stomach pain?"     Not sure 9. RELIEVING/AGGRAVATING FACTORS: "What makes it better or worse?" (e.g., movement, antacids, bowel movement)    Laying down makes better except on 08/29/20;standing and walking makes it worse 10. OTHER SYMPTOMS: "Do you have any other symptoms?" (e.g., back pain, diarrhea, fever, urination pain, vomiting)       Pain radiating to back, nausea, intermittent lightheadedness 11. PREGNANCY: "Is there any chance you are pregnant?" "When was your last menstrual period?"  Protocols used: ABDOMINAL PAIN - [facility]

## 2020-08-29 NOTE — Telephone Encounter (Signed)
Pt scheduled for 08/29/2020

## 2020-08-29 NOTE — Progress Notes (Signed)
BP 138/80   Pulse 73   Temp 98.8 F (37.1 C)   Wt 183 lb (83 kg) Comment: Patient reported  SpO2 97%   BMI 30.45 kg/m    Subjective:    Patient ID: Tammy Guerra, female    DOB: 07/28/1950, 70 y.o.   MRN: [id number]  HPI: Tammy Guerra is a 70 y.o. female  Chief Complaint  Patient presents with  . Abdominal Pain    Started on Friday right lower now has moved to her back as well, with nausea, no vomiting    Patient states she has some right sided pelvic pain that started Friday.  When she stands up she gets light headed, nauseous. Started radiating to her back this morning.  Urinary frequency.  Denies dysuria, foul odor or darker urine.  Denies hematuria.  Having regular stools. Recently had a colonoscopy.  Denies vomiting but feels like she could.  Hasn't checked BP at home.  Sugars are running 125-130 in the am.   Relevant past medical, surgical, family and social history reviewed and updated as indicated. Interim medical history since our last visit reviewed. Allergies and medications reviewed and updated.  Review of Systems  Eyes: Negative for visual disturbance.  Respiratory: Negative for cough, chest tightness and shortness of breath.   Cardiovascular: Negative for chest pain, palpitations and leg swelling.  Gastrointestinal: Positive for abdominal pain and nausea. Negative for diarrhea and vomiting.  Genitourinary: Positive for flank pain, frequency and pelvic pain. Negative for dysuria.  Neurological: Positive for light-headedness. Negative for dizziness and headaches.    Per HPI unless specifically indicated above     Objective:    BP 138/80   Pulse 73   Temp 98.8 F (37.1 C)   Wt 183 lb (83 kg) Comment: Patient reported  SpO2 97%   BMI 30.45 kg/m   Wt Readings from Last 3 Encounters:  08/29/20 183 lb (83 kg)  08/24/20 183 lb (83 kg)  08/01/20 185 lb (83.9 kg)    Physical Exam Vitals and nursing note reviewed.  Constitutional:      General: She  is not in acute distress.    Appearance: Normal appearance. She is normal weight. She is not ill-appearing, toxic-appearing or diaphoretic.  HENT:     Head: Normocephalic.     Right Ear: External ear normal.     Left Ear: External ear normal.     Nose: Nose normal.     Mouth/Throat:     Mouth: Mucous membranes are moist.     Pharynx: Oropharynx is clear.  Eyes:     General:        Right eye: No discharge.        Left eye: No discharge.     Extraocular Movements: Extraocular movements intact.     Conjunctiva/sclera: Conjunctivae normal.     Pupils: Pupils are equal, round, and reactive to light.  Cardiovascular:     Rate and Rhythm: Normal rate and regular rhythm.     Heart sounds: No murmur heard.   Pulmonary:     Effort: Pulmonary effort is normal. No respiratory distress.     Breath sounds: Normal breath sounds. No wheezing or rales.  Abdominal:     Tenderness: There is abdominal tenderness in the right lower quadrant. There is guarding. There is no right CVA tenderness, left CVA tenderness or rebound. Positive signs include McBurney's sign.  Musculoskeletal:     Cervical back: Normal range of motion and neck supple.  Skin:    General: Skin is warm and dry.     Capillary Refill: Capillary refill takes less than 2 seconds.  Neurological:     General: No focal deficit present.     Mental Status: She is alert and oriented to person, place, and time. Mental status is at baseline.  Psychiatric:        Mood and Affect: Mood normal.        Behavior: Behavior normal.        Thought Content: Thought content normal.        Judgment: Judgment normal.     Results for orders placed or performed in visit on 07/14/20  HM COLONOSCOPY  Result Value Ref Range   HM Colonoscopy See Report (in chart) See Report (in chart), Patient Reported      Assessment & Plan:   Problem List Items Addressed This Visit   None   Visit Diagnoses    Acute cystitis with hematuria    -  Primary    UA/UC ordered in office today.  Shows Acute Cystitis. Will draw CBC in house. Treat with cipro due to back pain. If CBC is abnormal will add Flagyl and poss. CT   Pelvic pain       Relevant Orders   Urinalysis, Routine w reflex microscopic   Urine Culture   CBC With Differential/Platelet (STAT)       Follow up plan: Return if symptoms worsen or fail to improve.

## 2020-08-29 NOTE — Telephone Encounter (Signed)
Lvm to schduled an apt per PCP

## 2020-08-30 LAB — CBC WITH DIFFERENTIAL/PLATELET
Hematocrit: 44.2 % (ref 34.0–46.6)
Hemoglobin: 15.2 g/dL (ref 11.1–15.9)
Lymphocytes Absolute: 2.9 10*3/uL (ref 0.7–3.1)
Lymphs: 28 %
MCH: 31.5 pg (ref 26.6–33.0)
MCHC: 34.4 g/dL (ref 31.5–35.7)
MCV: 92 fL (ref 79–97)
MID (Absolute): 0.7 10*3/uL (ref 0.1–1.6)
MID: 7 %
Neutrophils Absolute: 6.6 10*3/uL (ref 1.4–7.0)
Neutrophils: 65 %
Platelets: 310 10*3/uL (ref 150–450)
RBC: 4.83 x10E6/uL (ref 3.77–5.28)
RDW: 13.5 % (ref 11.7–15.4)
WBC: 10.2 10*3/uL (ref 3.4–10.8)

## 2020-08-30 LAB — URINALYSIS, ROUTINE W REFLEX MICROSCOPIC
Bilirubin, UA: NEGATIVE
Glucose, UA: NEGATIVE
Nitrite, UA: NEGATIVE
Specific Gravity, UA: 1.025 (ref 1.005–1.030)
Urobilinogen, Ur: 1 mg/dL (ref 0.2–1.0)
pH, UA: 7 (ref 5.0–7.5)

## 2020-08-30 LAB — MICROSCOPIC EXAMINATION

## 2020-08-30 NOTE — Progress Notes (Signed)
Hi Tammy Guerra.  Your white blood cell count is within normal limits.  This is great news.  We will continue with the Cipro antibiotic.  If you don't improve by this afternoon, please let me know.

## 2020-09-02 LAB — URINE CULTURE

## 2020-09-02 NOTE — Progress Notes (Signed)
Hi Tammy Guerra.  No growth on your urine culture.  We will complete the course of antibiotics as discussed.  If the pain has not resolved we will do some imaging.  Keep me updated.

## 2020-09-09 ENCOUNTER — Other Ambulatory Visit: Payer: Self-pay | Admitting: *Deleted

## 2020-09-09 DIAGNOSIS — Z87891 Personal history of nicotine dependence: Secondary | ICD-10-CM

## 2020-09-09 DIAGNOSIS — Z122 Encounter for screening for malignant neoplasm of respiratory organs: Secondary | ICD-10-CM

## 2020-09-09 DIAGNOSIS — F17200 Nicotine dependence, unspecified, uncomplicated: Secondary | ICD-10-CM

## 2020-09-09 NOTE — Progress Notes (Signed)
Contacted and scheduled for annual lung screening scan. Patient is a current smoker with a 42.5 pack year history 

## 2020-09-10 ENCOUNTER — Other Ambulatory Visit: Payer: Self-pay | Admitting: Nurse Practitioner

## 2020-09-13 ENCOUNTER — Ambulatory Visit: Payer: Medicare HMO | Admitting: Nurse Practitioner

## 2020-09-15 ENCOUNTER — Encounter: Payer: Self-pay | Admitting: Urology

## 2020-09-15 ENCOUNTER — Ambulatory Visit: Payer: Medicare HMO | Admitting: Urology

## 2020-09-15 ENCOUNTER — Ambulatory Visit
Admission: RE | Admit: 2020-09-15 | Discharge: 2020-09-15 | Disposition: A | Discharge status: Home / Self Care | Payer: Medicare HMO | Source: Ambulatory Visit | Attending: Urology | Admitting: Urology

## 2020-09-15 ENCOUNTER — Other Ambulatory Visit: Payer: Self-pay

## 2020-09-15 VITALS — BP 115/73 | HR 75 | Ht 65.0 in | Wt 180.0 lb

## 2020-09-15 DIAGNOSIS — R109 Unspecified abdominal pain: Secondary | ICD-10-CM

## 2020-09-15 DIAGNOSIS — N200 Calculus of kidney: Secondary | ICD-10-CM

## 2020-09-15 DIAGNOSIS — D2372 Other benign neoplasm of skin of left lower limb, including hip: Secondary | ICD-10-CM | POA: Diagnosis not present

## 2020-09-15 DIAGNOSIS — M79672 Pain in left foot: Secondary | ICD-10-CM | POA: Diagnosis not present

## 2020-09-15 DIAGNOSIS — E118 Type 2 diabetes mellitus with unspecified complications: Secondary | ICD-10-CM | POA: Diagnosis not present

## 2020-09-15 NOTE — Progress Notes (Signed)
09/15/2020 9:15 AM   Tammy Guerra D Dax 24-Aug-1950 [id number]  Referring provider: Kirk Ruths, MD [address] [place],  [place] [address]  Chief Complaint  Patient presents with  . Nephrolithiasis    Urologic history: 1.  Bosniak 2 left renal cyst  stable since 2010  2.  Left nephrolithiasis   HPI: 70 y.o. female presents for annual follow-up.   Saw PCP 08/29/2020 with a 3-day history of right lower quadrant abdominal pain with some radiation to the back  + Nausea without vomiting  + Urinary frequency  No fever or chills  Urinalysis was unremarkable; a urine culture was ordered and she was started empiric antibiotic therapy  Urine culture was negative  Symptoms have improved though still having some right back pain   PMH: Past Medical History:  Diagnosis Date  . Anxiety   . Arthritis   . Cancer [facility])    skin/CERVICAL CA  . Complication of anesthesia    discomfort during first cataract  . Diabetes mellitus without complication ([place])   . Emphysema lung ([place]) 07/01/2020  . GERD (gastroesophageal reflux disease)    NO MEDS  . History of kidney stones    STONES AND CYSTS  . Kidney stone   . Palpitations   . Pancreatic cyst     Surgical History: Past Surgical History:  Procedure Laterality Date  . ABDOMINAL HYSTERECTOMY    . CARDIAC CATHETERIZATION    . CATARACT EXTRACTION W/PHACO Left 09/07/2014   Procedure: CATARACT EXTRACTION PHACO AND INTRAOCULAR LENS PLACEMENT (IOC);  Surgeon: Birder Robson, MD;  Location: [facility] ORS;  Service: Ophthalmology;  Laterality: Left;  [place] 01:03 AP% 27.7 CDE 17.55  . CORONARY ANGIOPLASTY    . EYE SURGERY     cataract  . KNEE ARTHROSCOPY WITH MEDIAL MENISECTOMY Right 05/30/2017   Procedure: KNEE ARTHROSCOPY WITH MEDIAL AND LATERAL  MENISECTOMY;  Surgeon: Hessie Knows, MD;  Location: [facility] ORS;  Service: Orthopedics;  Laterality: Right;  . SYNOVECTOMY Right 05/30/2017   Procedure:  SYNOVECTOMY;  Surgeon: Hessie Knows, MD;  Location: [facility] ORS;  Service: Orthopedics;  Laterality: Right;    Home Medications:  Allergies as of 09/15/2020   No Known Allergies     Medication List       Accurate as of Sep 15, 2020  9:15 AM. If you have any questions, ask your nurse or doctor.        ALPRAZolam 0.25 MG tablet Commonly known as: XANAX Take 1 tablet (0.25 mg total) by mouth as needed for anxiety.   ezetimibe 10 MG tablet Commonly known as: ZETIA Take 1 tablet (10 mg total) by mouth daily.   metFORMIN 500 MG 24 hr tablet Commonly known as: GLUCOPHAGE-XR Take 2 tablets by mouth daily with supper.   Ozempic (0.25 or 0.5 MG/DOSE) 2 MG/1.5ML Sopn Generic drug: Semaglutide(0.25 or 0.5MG /DOS) Inject 0.25 mg into the skin once a week. Start with 0.25MG  once a week x 4 weeks, then increase to 0.5MG  weekly.   rosuvastatin 20 MG tablet Commonly known as: CRESTOR Take 20 mg by mouth daily.   traZODone 50 MG tablet Commonly known as: DESYREL TAKE 1 TABLET (50 MG TOTAL) BY MOUTH AT BEDTIME AS NEEDED FOR SLEEP (CAN TAKE 1-2 TABS AS NEEDED ).       Allergies: No Known Allergies  Family History: Family History  Problem Relation Age of Onset  . Diabetes Father   . Alcohol abuse Father   . Early death  Father   . Heart disease Father   . Stroke Father   . Diabetes Brother   . Alcohol abuse Brother   . Early death Brother   . Heart disease Brother   . Stroke Brother   . Diabetes Brother   . Cancer Brother   . Early death Brother   . Early death Brother   . Cancer Paternal Aunt        Breast  . Breast cancer Neg Hx     Social History:  reports that she has been smoking cigarettes. She has a 20.50 pack-year smoking history. She has never used smokeless tobacco. She reports that she does not drink alcohol and does not use drugs.   Physical Exam: BP 115/73   Pulse 75   Ht 5\' 5"  (1.651 m)   Wt 180 lb (81.6 kg)   BMI 29.95 kg/m   Constitutional:  Alert and  oriented, No acute distress. HEENT: Barren AT, moist mucus membranes.  Trachea midline, no masses. Cardiovascular: No clubbing, cyanosis, or edema. Respiratory: Normal respiratory effort, no increased work of breathing.   Pertinent Imaging: KUB performed this morning was reviewed and there is a stable left lower pole renal calculus   Assessment & Plan:    1.  Left nephrolithiasis  Stable  Continue annual follow-up  2.  Right back pain  Recent episode right lower quad abdominal pain radiating to the right flank region  She is scheduled for a chest CT next week and will place an order for a stone protocol CT to see if this can be performed at the same time.  Will call with results  3.  Bosniak 2 left renal cyst  No further follow-up imaging recommended for Bosniak 2 cysts and this has been stable since 2010  She did inquire about monitoring of her pancreatic cyst.  She was having MRIs ordered by gastroenterology at [facility] and she was instructed to call that department to see if further imaging is recommended.  Abbie Sons, [place] [address] [place], [place] [address] [phone]

## 2020-09-19 ENCOUNTER — Encounter: Payer: Self-pay | Admitting: Urology

## 2020-09-21 ENCOUNTER — Other Ambulatory Visit: Payer: Self-pay | Admitting: Cardiovascular Disease

## 2020-09-21 DIAGNOSIS — I739 Peripheral vascular disease, unspecified: Secondary | ICD-10-CM

## 2020-09-22 DIAGNOSIS — K862 Cyst of pancreas: Secondary | ICD-10-CM | POA: Diagnosis not present

## 2020-09-22 DIAGNOSIS — K649 Unspecified hemorrhoids: Secondary | ICD-10-CM | POA: Diagnosis not present

## 2020-09-23 ENCOUNTER — Telehealth: Payer: Self-pay | Admitting: *Deleted

## 2020-09-23 ENCOUNTER — Other Ambulatory Visit: Payer: Self-pay

## 2020-09-23 ENCOUNTER — Ambulatory Visit
Admission: RE | Admit: 2020-09-23 | Discharge: 2020-09-23 | Disposition: A | Discharge status: Home / Self Care | Payer: Medicare HMO | Source: Ambulatory Visit | Attending: Oncology | Admitting: Oncology

## 2020-09-23 DIAGNOSIS — Z87891 Personal history of nicotine dependence: Secondary | ICD-10-CM | POA: Diagnosis not present

## 2020-09-23 DIAGNOSIS — F17200 Nicotine dependence, unspecified, uncomplicated: Secondary | ICD-10-CM | POA: Diagnosis present

## 2020-09-23 DIAGNOSIS — Z122 Encounter for screening for malignant neoplasm of respiratory organs: Secondary | ICD-10-CM | POA: Diagnosis not present

## 2020-09-23 DIAGNOSIS — N200 Calculus of kidney: Secondary | ICD-10-CM

## 2020-09-23 DIAGNOSIS — I700 Atherosclerosis of aorta: Secondary | ICD-10-CM | POA: Diagnosis not present

## 2020-09-23 DIAGNOSIS — R69 Illness, unspecified: Secondary | ICD-10-CM | POA: Diagnosis not present

## 2020-09-23 DIAGNOSIS — R109 Unspecified abdominal pain: Secondary | ICD-10-CM | POA: Insufficient documentation

## 2020-09-23 DIAGNOSIS — M160 Bilateral primary osteoarthritis of hip: Secondary | ICD-10-CM | POA: Diagnosis not present

## 2020-09-23 DIAGNOSIS — K862 Cyst of pancreas: Secondary | ICD-10-CM | POA: Diagnosis not present

## 2020-09-23 NOTE — Telephone Encounter (Signed)
-----   Message from Abbie Sons, MD sent at 09/23/2020  3:27 PM EDT ----- CT was reviewed and no right renal or ureteral calculi are seen.  Do not see a urologic cause for her pain based on CT.  Recommend PCP follow-up for further evaluation

## 2020-09-23 NOTE — Telephone Encounter (Signed)
Notified patient as instructed, patient pleased °

## 2020-09-26 ENCOUNTER — Other Ambulatory Visit: Payer: Self-pay | Admitting: Gastroenterology

## 2020-09-26 DIAGNOSIS — K862 Cyst of pancreas: Secondary | ICD-10-CM

## 2020-09-27 ENCOUNTER — Ambulatory Visit (INDEPENDENT_AMBULATORY_CARE_PROVIDER_SITE_OTHER): Payer: Medicare HMO

## 2020-09-27 ENCOUNTER — Other Ambulatory Visit: Payer: Self-pay | Admitting: Gastroenterology

## 2020-09-27 ENCOUNTER — Other Ambulatory Visit: Payer: Self-pay

## 2020-09-27 ENCOUNTER — Other Ambulatory Visit: Payer: Self-pay | Admitting: Cardiovascular Disease

## 2020-09-27 DIAGNOSIS — I739 Peripheral vascular disease, unspecified: Secondary | ICD-10-CM

## 2020-09-27 DIAGNOSIS — K862 Cyst of pancreas: Secondary | ICD-10-CM

## 2020-09-28 ENCOUNTER — Telehealth: Payer: Self-pay

## 2020-09-28 NOTE — Telephone Encounter (Signed)
Able to reach pt regarding her recent LE vascular US, Dr. Rockey Situ had a chance to review his results and advised   " LE arterial u/s   Right: 75-99% stenosis noted in the superficial femoral artery and/or popliteal artery.  Appears appt with Dr. Fletcher Anon made, per result report"  Pt reports is aware of her left leg total occlusion noted in the superficial femoral artery. Wonders if her "hip pain" is related to the occlusions and stenosis. Advised this can be discuss further in detail at upcoming appt with Dr. Fletcher Anon on 6/14 at 1:20 pm.   Advised that he may want further testing, med changes and/or referral to vascular. Mrs. Mcquade verbalized understanding and will wait for appt to determine.   Willl call back for anything further.

## 2020-09-29 NOTE — Telephone Encounter (Signed)
Patient daughter Tammy Guerra calling to discuss results - on DPR  Please call [phone]

## 2020-09-29 NOTE — Telephone Encounter (Signed)
Return call to Mrs. Cappucci, her daughter had called earlier, but Shirlean Mylar asked if she needed to keep appt with Dr. Fletcher Anon next week or should she cancel and make an appt with Dr. Lucky Cowboy who is vascular. Shirlean Mylar reports several of her friends have recommended him after finding out her [place] results.   Advised Robin that Dr. Fletcher Anon specializes in cardiology, interventional cardiology and endovascular medicine. He specializes in coronary angioplasty, atherectomy and stent placement. Also, known for advanced treatment of peripheral vascular disease.   Shirlean Mylar agrees to keep appt with Dr. Fletcher Anon on 6/14, from there they can discuss her vascular US results, medications, treatment options, and plan of care. Anything further, will call back.

## 2020-10-04 ENCOUNTER — Encounter: Payer: Self-pay | Admitting: *Deleted

## 2020-10-04 ENCOUNTER — Encounter: Payer: Self-pay | Admitting: Cardiovascular Disease

## 2020-10-04 ENCOUNTER — Other Ambulatory Visit: Payer: Self-pay

## 2020-10-04 ENCOUNTER — Ambulatory Visit: Payer: Medicare HMO | Admitting: Cardiovascular Disease

## 2020-10-04 VITALS — BP 134/85 | HR 77 | Ht 65.0 in | Wt 179.0 lb

## 2020-10-04 DIAGNOSIS — I2510 Atherosclerotic heart disease of native coronary artery without angina pectoris: Secondary | ICD-10-CM

## 2020-10-04 DIAGNOSIS — I739 Peripheral vascular disease, unspecified: Secondary | ICD-10-CM | POA: Diagnosis not present

## 2020-10-04 DIAGNOSIS — Z720 Tobacco use: Secondary | ICD-10-CM

## 2020-10-04 DIAGNOSIS — I700 Atherosclerosis of aorta: Secondary | ICD-10-CM | POA: Diagnosis not present

## 2020-10-04 MED ORDER — EZETIMIBE 10 MG PO TABS: 10.0000 mg | ORAL_TABLET | Freq: Every day | ORAL | 3 refills | Status: DC

## 2020-10-04 MED ORDER — CILOSTAZOL 50 MG PO TABS: 50.0000 mg | ORAL_TABLET | Freq: Two times a day (BID) | ORAL | 5 refills | Status: DC

## 2020-10-04 NOTE — Patient Instructions (Signed)
Medication Instructions:   Your physician has recommended you make the following change in your medication:    START taking Pletal (Cilastozol) 50MG  twice a day.  *If you need a refill on your cardiac medications before your next appointment, please call your pharmacy*   Lab Work: None ordered If you have labs (blood work) drawn today and your tests are completely normal, you will receive your results only by: [place] (if you have MyChart) OR A paper copy in the mail If you have any lab test that is abnormal or we need to change your treatment, we will call you to review the results.   Testing/Procedures: None ordered   Follow-Up: At [facility], you and your health needs are our priority.  As part of our continuing mission to provide you with exceptional heart care, we have created designated Provider Care Teams.  These Care Teams include your primary Cardiologist (physician) and Advanced Practice Providers (APPs -  Physician Assistants and Nurse Practitioners) who all work together to provide you with the care you need, when you need it.  We recommend signing up for the patient portal called "MyChart".  Sign up information is provided on this After Visit Summary.  MyChart is used to connect with patients for Virtual Visits (Telemedicine).  Patients are able to view lab/test results, encounter notes, upcoming appointments, etc.  Non-urgent messages can be sent to your provider as well.   To learn more about what you can do with MyChart, go to [email or web address].    Your next appointment:   3 month(s)  The format for your next appointment:   In Person  Provider:   You may see Dr. Fletcher Anon or one of the following Advanced Practice Providers on your designated Care Team:   Murray Hodgkins, NP Christell Faith, PA-C Marrianne Mood, PA-C Cadence Broxton, [place] Laurann Montana, NP   Other Instructions

## 2020-10-04 NOTE — Progress Notes (Signed)
Cardiology Office Note   Date:  10/04/2020   ID:  Tammy Guerra, DOB [birth year], MRN [id number]  PCP:  [name], NP  Cardiologist:  Dr. Rockey Situ  Chief Complaint  Patient presents with   Follow up arterial scan    Patient c/o pain in legs with walking and lightheadedness with little activity. Medications reviewed by the patient verbally.       History of Present Illness: Tammy Guerra is a 70 y.o. female who was referred by Dr. Rockey Situ for evaluation management of peripheral arterial disease. She has past medical history of pancreatic cyst, anxiety, depression, diabetes mellitus, tobacco use and aortic and coronary calcifications. She reports bilateral leg pain with walking which started few years ago and has been getting worse.  The pain is in the thigh and calf area and seems to be equal in both sides.  This happens after walking less than 1 block.  She thought at some point that her symptoms were due to knee arthritis but she had no improvement after right knee surgery.  She has no rest pain or lower extremity ulceration. She has been trying to cut down on tobacco use and currently smokes 10 to 12 cigarettes a day.  She has been smoking since she was a teenager.  Recent noninvasive vascular studies showed an ABI of 0.92 on the right and 0.63 on the left.  Duplex showed severe stenosis of the right popliteal artery with chronically occluded left SFA throughout its whole length  She denies chest pain but does have chronic exertional dyspnea.  Past Medical History:  Diagnosis Date   Anxiety    Arthritis    Cancer ([place])    skin/CERVICAL CA   Complication of anesthesia    discomfort during first cataract   Diabetes mellitus without complication (HCC)    Emphysema lung ([place]) 07/01/2020   GERD (gastroesophageal reflux disease)    NO MEDS   History of kidney stones    STONES AND CYSTS   Kidney stone    Palpitations    Pancreatic cyst     Past Surgical History:   Procedure Laterality Date   ABDOMINAL HYSTERECTOMY     CARDIAC CATHETERIZATION     CATARACT EXTRACTION W/PHACO Left 09/07/2014   Procedure: CATARACT EXTRACTION PHACO AND INTRAOCULAR LENS PLACEMENT ([place]);  Surgeon: Birder Robson, MD;  Location: [facility] ORS;  Service: Ophthalmology;  Laterality: Left;  [place] 01:03 AP% 27.7 CDE 17.55   CORONARY ANGIOPLASTY     EYE SURGERY     cataract   KNEE ARTHROSCOPY WITH MEDIAL MENISECTOMY Right 05/30/2017   Procedure: KNEE ARTHROSCOPY WITH MEDIAL AND LATERAL  MENISECTOMY;  Surgeon: Hessie Knows, MD;  Location: [facility] ORS;  Service: Orthopedics;  Laterality: Right;   SYNOVECTOMY Right 05/30/2017   Procedure: SYNOVECTOMY;  Surgeon: Hessie Knows, MD;  Location: [facility] ORS;  Service: Orthopedics;  Laterality: Right;     Current Outpatient Medications  Medication Sig Dispense Refill   ALPRAZolam (XANAX) 0.25 MG tablet Take 1 tablet (0.25 mg total) by mouth as needed for anxiety. 30 tablet 0   ezetimibe (ZETIA) 10 MG tablet Take 1 tablet (10 mg total) by mouth daily. 90 tablet 3   hydrocortisone (ANUSOL-HC) 2.5 % rectal cream Apply topically 2 (two) times daily.     metFORMIN (GLUCOPHAGE-XR) 500 MG 24 hr tablet Take 2 tablets by mouth daily with supper.     rosuvastatin (CRESTOR) 20 MG tablet Take 20 mg by mouth daily.     Semaglutide,0.25  or 0.5MG /DOS, (OZEMPIC, 0.25 OR 0.5 MG/DOSE,) 2 MG/1.5ML SOPN Inject 0.25 mg into the skin once a week. Start with 0.25MG  once a week x 4 weeks, then increase to 0.5MG  weekly. 6 mL 1   traZODone (DESYREL) 50 MG tablet TAKE 1 TABLET (50 MG TOTAL) BY MOUTH AT BEDTIME AS NEEDED FOR SLEEP (CAN TAKE 1-2 TABS AS NEEDED ). 90 tablet 1   No current facility-administered medications for this visit.    Allergies:   Patient has no known allergies.    Social History:  The patient  reports that she has been smoking cigarettes. She has a 20.50 pack-year smoking history. She has never used smokeless tobacco. She reports that she does not  drink alcohol and does not use drugs.   Family History:  The patient's family history includes Alcohol abuse in her brother and father; Cancer in her brother and paternal aunt; Diabetes in her brother, brother, and father; Early death in her brother, brother, brother, and father; Heart disease in her brother and father; Stroke in her brother and father.    ROS:  Please see the history of present illness.   Otherwise, review of systems are positive for none.   All other systems are reviewed and negative.    PHYSICAL EXAM: VS:  BP 134/85 (BP Location: Left Arm, Patient Position: Sitting, Cuff Size: Normal)   Pulse 77   Ht 5\' 5"  (1.651 m)   Wt 179 lb (81.2 kg)   SpO2 97%   BMI 29.79 kg/m  , BMI Body mass index is 29.79 kg/m. GEN: Well nourished, well developed, in no acute distress  HEENT: normal  Neck: no JVD, carotid bruits, or masses Cardiac: RRR; no murmurs, rubs, or gallops,no edema  Respiratory:  clear to auscultation bilaterally, normal work of breathing GI: soft, nontender, nondistended, + BS MS: no deformity or atrophy  Skin: warm and dry, no rash Neuro:  Strength and sensation are intact Psych: euthymic mood, full affect Vascular: Radial pulse: Normal bilaterally.  Femoral pulses are normal.  Distal pulses are not palpable   EKG:  EKG is ordered today. The ekg ordered today demonstrates normal sinus rhythm with nonspecific T wave changes.   Recent Labs: 07/01/2020: ALT 14; BUN 9; Creatinine, Ser 0.61; Potassium 4.4; Sodium 142 08/29/2020: Hemoglobin 15.2; Platelets 310    Lipid Panel No results found for: CHOL, TRIG, HDL, CHOLHDL, VLDL, LDLCALC, LDLDIRECT    Wt Readings from Last 3 Encounters:  10/04/20 179 lb (81.2 kg)  09/23/20 180 lb (81.6 kg)  09/15/20 180 lb (81.6 kg)       PAD Screen 08/24/2020  Previous PAD dx? No  Previous surgical procedure? No  Pain with walking? Yes  Subsides with rest? Yes  Feet/toe relief with dangling? Yes  Painful, non-healing  ulcers? No  Extremities discolored? No      ASSESSMENT AND PLAN:  1.  Peripheral arterial disease: Severe bilateral leg claudication.  I discussed with her the natural history and management of peripheral arterial disease.  Given longer occlusion of the left SFA, chance of maintaining good long-term patency with endovascular revascularization is overall low.  I do not think her symptoms are severe enough to justify femoral popliteal bypass. I think it is best to attempt a walking program and cilostazol initially before considering revascularization.  I discussed this with her.  I added cilostazol 50 mg twice daily.  She is going to start walking a minimum of 30 minutes daily. I also discussed with her the  importance of controlling her risk factors especially quitting smoking.  2.  Coronary artery calcifications: Currently with no anginal symptoms.  3.  Tobacco use: I discussed with her the importance of complete cessation.  4.  Hyperlipidemia: Continue treatment with rosuvastatin and ezetimibe with a target LDL of less than 70.    Disposition:   FU with me in 3 months  Signed,  Kathlyn Sacramento, MD  10/04/2020 1:32 PM    [facility]

## 2020-10-07 ENCOUNTER — Ambulatory Visit: Payer: Medicare HMO

## 2020-10-07 DIAGNOSIS — K648 Other hemorrhoids: Secondary | ICD-10-CM | POA: Diagnosis not present

## 2020-10-10 DIAGNOSIS — E113299 Type 2 diabetes mellitus with mild nonproliferative diabetic retinopathy without macular edema, unspecified eye: Secondary | ICD-10-CM | POA: Diagnosis not present

## 2020-10-10 LAB — HM DIABETES EYE EXAM

## 2020-10-16 NOTE — Progress Notes (Signed)
BP 121/71   Pulse 77   Temp 98.2 F (36.8 C) (Oral)   Wt 178 lb 12.8 oz (81.1 kg)   BMI 29.75 kg/m    Subjective:    Patient ID: Tammy Guerra, female    DOB: 05-01-1950, 70 y.o.   MRN: [id number]  HPI: Tammy Guerra is a 70 y.o. female  Chief Complaint  Patient presents with   Diabetes   Anxiety    Patient reports that she was on Alprazolam to help with sleep at night but states that she has not been taking them, she reports good compliance and symptom control on Trazodone.   Hip Pain    Patient would like to discuss pain management today for chronic pain in her right hip, patient states that pain is constant and describes it as achy. Patient reports that the only time she has relief is when she lays down, she states that she has been taking otc Ibuprofen with little relief   DIABETES Hypoglycemic episodes:no Polydipsia/polyuria: no Visual disturbance: no Chest pain: no Paresthesias: no Glucose Monitoring: no  Accucheck frequency: Not Checking  Fasting glucose:  Post prandial:  Evening:  Before meals: Taking Insulin?: no  Long acting insulin:  Short acting insulin: Blood Pressure Monitoring: not checking Retinal Examination: Up to Date Foot Exam: Up to Date Diabetic Education: Not Completed Pneumovax: unknown Influenza: unknown Aspirin:  Pletal  ANXIETY Patient has not been taking the trazodone for sleep and has not been using the Xanax.  She does still have some anxiety and uses the Trazodone sparingly.  But her sleep is well controlled with the Trazodone.  HIP PAIN Patient states she has been having pain in her lower legs for over 3 years.  Patient states she saw a vascular doctor who states she had lumbar problems and didn't help her. Patient states the Cardiologist found a blockage in her left leg. Patient's cardiologist wants her to walk but she is unable to because of her lower back/hip pain.  Patient would like a referral to Orthopedics.    Relevant  past medical, surgical, family and social history reviewed and updated as indicated. Interim medical history since our last visit reviewed. Allergies and medications reviewed and updated.  Review of Systems  Eyes:  Negative for visual disturbance.  Cardiovascular:  Negative for chest pain.  Endocrine: Negative for polydipsia and polyuria.  Musculoskeletal:        Right hip/back pain  Neurological:  Negative for numbness.  Psychiatric/Behavioral:  Negative for sleep disturbance. The patient is nervous/anxious.    Per HPI unless specifically indicated above     Objective:    BP 121/71   Pulse 77   Temp 98.2 F (36.8 C) (Oral)   Wt 178 lb 12.8 oz (81.1 kg)   BMI 29.75 kg/m   Wt Readings from Last 3 Encounters:  10/17/20 178 lb 12.8 oz (81.1 kg)  10/04/20 179 lb (81.2 kg)  09/23/20 180 lb (81.6 kg)    Physical Exam Vitals and nursing note reviewed.  Constitutional:      General: She is not in acute distress.    Appearance: Normal appearance. She is normal weight. She is not ill-appearing, toxic-appearing or diaphoretic.  HENT:     Head: Normocephalic.     Right Ear: External ear normal.     Left Ear: External ear normal.     Nose: Nose normal.     Mouth/Throat:     Mouth: Mucous membranes are moist.  Pharynx: Oropharynx is clear.  Eyes:     General:        Right eye: No discharge.        Left eye: No discharge.     Extraocular Movements: Extraocular movements intact.     Conjunctiva/sclera: Conjunctivae normal.     Pupils: Pupils are equal, round, and reactive to light.  Cardiovascular:     Rate and Rhythm: Normal rate and regular rhythm.     Heart sounds: No murmur heard. Pulmonary:     Effort: Pulmonary effort is normal. No respiratory distress.     Breath sounds: Normal breath sounds. No wheezing or rales.  Musculoskeletal:     Cervical back: Normal range of motion and neck supple.  Skin:    General: Skin is warm and dry.     Capillary Refill: Capillary  refill takes less than 2 seconds.  Neurological:     General: No focal deficit present.     Mental Status: She is alert and oriented to person, place, and time. Mental status is at baseline.  Psychiatric:        Mood and Affect: Mood normal.        Behavior: Behavior normal.        Thought Content: Thought content normal.        Judgment: Judgment normal.    Results for orders placed or performed in visit on 10/12/20  HM DIABETES EYE EXAM  Result Value Ref Range   HM Diabetic Eye Exam Retinopathy (A) No Retinopathy      Assessment & Plan:   Problem List Items Addressed This Visit       Endocrine   Uncontrolled type 2 diabetes mellitus with hyperglycemia ([place]) - Primary    Chronic. Labs ordered today.  Added Lisinopril 2.64m to regimen to help with kidney protection. Will make further recommendations based on lab results. Follow up in 3 months.       Relevant Medications   lisinopril (ZESTRIL) 2.5 MG tablet   Other Relevant Orders   Comp Met (CMET)   HgB A1c     Other   Generalized anxiety disorder    Chronic.  Well controlled with PRN xanax.  Last refill given in April. Patient still has some left. Follow up in 3 months for reevaluation.       Insomnia    Chronic.  Controlled.  Continue with current medication regimen.  Labs ordered today.  Return to clinic in 6 months for reevaluation.  Call sooner if concerns arise.         Other Visit Diagnoses     Chronic right-sided low back pain without sciatica       Referral placed for Orthopedics. Follow up if symptoms worsen or fail to improve.   Relevant Orders   Ambulatory referral to Orthopedics        Follow up plan: Return in about 3 months (around 01/17/2021) for HTN, HLD, DM2 FU.

## 2020-10-17 ENCOUNTER — Ambulatory Visit (INDEPENDENT_AMBULATORY_CARE_PROVIDER_SITE_OTHER): Payer: Medicare HMO | Admitting: Nurse Practitioner

## 2020-10-17 ENCOUNTER — Encounter: Payer: Self-pay | Admitting: Nurse Practitioner

## 2020-10-17 ENCOUNTER — Other Ambulatory Visit: Payer: Self-pay

## 2020-10-17 VITALS — BP 121/71 | HR 77 | Temp 98.2°F | Wt 178.8 lb

## 2020-10-17 DIAGNOSIS — R69 Illness, unspecified: Secondary | ICD-10-CM | POA: Diagnosis not present

## 2020-10-17 DIAGNOSIS — E1165 Type 2 diabetes mellitus with hyperglycemia: Secondary | ICD-10-CM | POA: Diagnosis not present

## 2020-10-17 DIAGNOSIS — M545 Low back pain, unspecified: Secondary | ICD-10-CM | POA: Diagnosis not present

## 2020-10-17 DIAGNOSIS — G4700 Insomnia, unspecified: Secondary | ICD-10-CM

## 2020-10-17 DIAGNOSIS — G8929 Other chronic pain: Secondary | ICD-10-CM

## 2020-10-17 DIAGNOSIS — F411 Generalized anxiety disorder: Secondary | ICD-10-CM | POA: Diagnosis not present

## 2020-10-17 MED ORDER — LISINOPRIL 2.5 MG PO TABS: 2.5000 mg | ORAL_TABLET | Freq: Every day | ORAL | 1 refills | Status: DC

## 2020-10-17 NOTE — Assessment & Plan Note (Signed)
Chronic.  Controlled.  Continue with current medication regimen.  Labs ordered today.  Return to clinic in 6 months for reevaluation.  Call sooner if concerns arise.  ? ?

## 2020-10-17 NOTE — Assessment & Plan Note (Signed)
Chronic.  Well controlled with PRN xanax.  Last refill given in April. Patient still has some left. Follow up in 3 months for reevaluation.

## 2020-10-17 NOTE — Assessment & Plan Note (Signed)
Chronic. Labs ordered today.  Added Lisinopril 2.5mg  to regimen to help with kidney protection. Will make further recommendations based on lab results. Follow up in 3 months.

## 2020-10-18 LAB — COMPREHENSIVE METABOLIC PANEL
ALT: 25 IU/L (ref 0–32)
AST: 19 IU/L (ref 0–40)
Albumin/Globulin Ratio: 2.6 — ABNORMAL HIGH (ref 1.2–2.2)
Albumin: 5 g/dL — ABNORMAL HIGH (ref 3.8–4.8)
Alkaline Phosphatase: 75 IU/L (ref 44–121)
BUN/Creatinine Ratio: 14 (ref 12–28)
BUN: 9 mg/dL (ref 8–27)
Bilirubin Total: 0.3 mg/dL (ref 0.0–1.2)
CO2: 20 mmol/L (ref 20–29)
Calcium: 10 mg/dL (ref 8.7–10.3)
Chloride: 101 mmol/L (ref 96–106)
Creatinine, Ser: 0.63 mg/dL (ref 0.57–1.00)
Globulin, Total: 1.9 g/dL (ref 1.5–4.5)
Glucose: 100 mg/dL — ABNORMAL HIGH (ref 65–99)
Potassium: 4.3 mmol/L (ref 3.5–5.2)
Sodium: 140 mmol/L (ref 134–144)
Total Protein: 6.9 g/dL (ref 6.0–8.5)
eGFR: 97 mL/min/{1.73_m2} (ref >59)

## 2020-10-18 LAB — HEMOGLOBIN A1C
Est. average glucose Bld gHb Est-mCnc: 146 mg/dL
Hgb A1c MFr Bld: 6.7 % — ABNORMAL HIGH (ref 4.8–5.6)

## 2020-10-18 NOTE — Progress Notes (Signed)
Please let patient know that her lab work looks good. A1c improve from 8.2 to 6.7 which is great news.  I want to continue with the same medication regimen at this time! Follow up as discussed.

## 2020-10-19 ENCOUNTER — Telehealth: Payer: Self-pay | Admitting: Cardiovascular Disease

## 2020-10-19 NOTE — Telephone Encounter (Signed)
Patient calling in reporting lower back/buttock area pain (feels like it is in the bone). Patient has an appointment with an ortho doctor on 7/8 however patient wants to make sure this is not affiliated with her lower extremity issues. Patient reports pain that is constant with this making her unable to do her daily walking. Patient is also curious if there are any other exercises that may be helpful for her other than walking.  Please advise

## 2020-10-20 NOTE — Telephone Encounter (Signed)
Spoke with the patient. Patient describes the pain/discomfort as lower back/buttock pain that will sometimes radiate down the back of her leg. She sts that around her hips it feels like the bone is shifting when she walks. She feels that the pain is in her lower back bones. She does have arthritis in her lower back.This has been going on for several months.  Adv her I was not sure if this was related to her PAD, and sounded like an orthopaedic issue (she mentioned "shifting bones"). PAD usually causes pain in the hips, thighs or calves when walking/exercising. She is scheduled to see ortho on 10/28/20 adv her to keep that appt. Also adv her that walking would be the most beneficial exercise for her PAD if she is able to walk through the discomfort.  Adv the patient that I fwd the message to Dr. Fletcher Anon as well and will call back if he has additional recommendations.  Patient voiced understanding and was very appreciative for the call back.

## 2020-10-21 NOTE — Telephone Encounter (Signed)
I agree with your evaluation.  Some of her calf Pain is due to PAD.  However, have hip and thigh pain is likely not related.

## 2020-10-26 DIAGNOSIS — Z13820 Encounter for screening for osteoporosis: Secondary | ICD-10-CM | POA: Diagnosis not present

## 2020-10-26 DIAGNOSIS — Z1231 Encounter for screening mammogram for malignant neoplasm of breast: Secondary | ICD-10-CM | POA: Diagnosis not present

## 2020-10-26 LAB — HM DEXA SCAN

## 2020-10-26 LAB — HM MAMMOGRAPHY

## 2020-10-28 DIAGNOSIS — M545 Low back pain, unspecified: Secondary | ICD-10-CM | POA: Diagnosis not present

## 2020-10-31 ENCOUNTER — Other Ambulatory Visit: Payer: Self-pay | Admitting: Nurse Practitioner

## 2020-10-31 NOTE — Telephone Encounter (Signed)
Requested medication (s) are due for refill today: ?  Requested medication (s) are on the active medication list: No  Last refill:  n/a  Future visit scheduled: No  Notes to clinic:  Not on medication list.    Requested Prescriptions  Pending Prescriptions Disp Refills   ONETOUCH ULTRA test strip [Pharmacy Med Name: [facility] TEST STRP] 100 strip 1    Sig: [place]      Endocrinology: Diabetes - Testing Supplies Passed - 10/31/2020  9:21 AM      Passed - Valid encounter within last 12 months    Recent Outpatient Visits           2 weeks ago Uncontrolled type 2 diabetes mellitus with hyperglycemia ([place])   [place], Santiago Glad, NP   2 months ago Acute cystitis with hematuria   [facility] [name], NP   3 months ago Encounter for screening for osteoporosis   [facility] [name], NP   4 months ago Aortic atherosclerosis ([place])   [facility] [name], NP       Future Appointments             In 2 months Arida, Mertie Clause, MD [place], [facility]   In 9 months  [name], [facility]   In 10 months Stoioff, [name], MD [facility]              Signed Prescriptions Disp Refills   traZODone (DESYREL) 50 MG tablet 90 tablet 0    Sig: TAKE 1 TABLET (50 MG TOTAL) BY MOUTH AT BEDTIME AS NEEDED FOR SLEEP (CAN TAKE 1-2 TABS AS NEEDED ).      Psychiatry: Antidepressants - Serotonin Modulator Passed - 10/31/2020  9:21 AM      Passed - Valid encounter within last 6 months    Recent Outpatient Visits           2 weeks ago Uncontrolled type 2 diabetes mellitus with hyperglycemia ([place])   [facility] [name], NP   2 months ago Acute cystitis with hematuria   [facility] [name], NP   3 months ago Encounter for screening for osteoporosis   [facility]  [name], NP   4 months ago Aortic atherosclerosis ([place])   [place], NP       Future Appointments             In 2 months Arida, Mertie Clause, MD [facility], [facility]   In 9 months  [name], [facility]   In 10 months Stoioff, [name], MD [facility]               metFORMIN (GLUCOPHAGE-XR) 500 MG 24 hr tablet 180 tablet 0    Sig: TAKE 2 TABLETS BY MOUTH DAILY WITH DINNER.      Endocrinology:  Diabetes - Biguanides Passed - 10/31/2020  9:21 AM      Passed - Cr in normal range and within 360 days    Creatinine, Ser  Date Value Ref Range Status  10/17/2020 0.63 0.57 - 1.00 mg/dL Final          Passed - HBA1C is between 0 and 7.9 and within 180 days    Hgb A1c MFr Bld  Date Value Ref Range Status  10/17/2020 6.7 (H) 4.8 - 5.6 % Final    Comment:  Prediabetes: 5.7 - 6.4          Diabetes: >6.4          Glycemic control for adults with diabetes: <7.0           Passed - eGFR in normal range and within 360 days    GFR calc Af Amer  Date Value Ref Range Status  05/27/2017 >60 >60 mL/min Final    Comment:    (NOTE) The eGFR has been calculated using the CKD EPI equation. This calculation has not been validated in all clinical situations. eGFR's persistently <60 mL/min signify possible Chronic Kidney Disease.    GFR calc non Af Amer  Date Value Ref Range Status  05/27/2017 >60 >60 mL/min Final   eGFR  Date Value Ref Range Status  10/17/2020 97 >59 mL/min/1.73 Final          Passed - Valid encounter within last 6 months    Recent Outpatient Visits           2 weeks ago Uncontrolled type 2 diabetes mellitus with hyperglycemia ([place])   [facility] [name], NP   2 months ago Acute cystitis with hematuria   [place], NP   3 months ago Encounter for screening for osteoporosis   [facility]  [name], NP   4 months ago Aortic atherosclerosis [facility])   [facility] [name], NP       Future Appointments             In 2 months Arida, Mertie Clause, MD [facility], [facility]   In 9 months  [name], [place]   In 10 months Stoioff, [name], MD [place]

## 2020-11-01 NOTE — Telephone Encounter (Signed)
Return in about 3 months (around 01/17/2021) for HTN, HLD, DM2 FU.

## 2020-11-02 ENCOUNTER — Telehealth: Payer: Self-pay | Admitting: Nurse Practitioner

## 2020-11-02 NOTE — Telephone Encounter (Signed)
Pt has reach the donut hole / Pt stated she went to get her RX for Ozempic and it has went from $48 to over $200 due to the donut hole/ pt wanted to ask Santiago Glad what she advises or about a lower cost option/ please advise asap

## 2020-11-03 NOTE — Telephone Encounter (Signed)
Per last note Return in about 3 months (around 01/17/2021) for HTN, HLD, DM2 FU

## 2020-11-04 MED ORDER — ALPRAZOLAM 0.25 MG PO TABS: 0.2500 mg | ORAL_TABLET | ORAL | 0 refills | Status: DC | PRN

## 2020-11-04 NOTE — Telephone Encounter (Signed)
Pt still waiting on call to figure out what she can do to get her ozempic to a cost she can afford.   Also wants her Xanax refilled and she has not heard anything back. Please advise.

## 2020-11-04 NOTE — Telephone Encounter (Signed)
I am working on finding an alternative for patient which is why I haven't responded.   Xanax sent to the pharmacy.

## 2020-11-07 NOTE — Telephone Encounter (Signed)
Spoke with patient and she says that would be great for her to stop by and pick up the samples, but she says she would not be able to pick them up until Wednesday.

## 2020-11-07 NOTE — Telephone Encounter (Signed)
We have samples of Ozempic. I will fill out the information and she can just come in and pick it up.

## 2020-11-09 DIAGNOSIS — H47391 Other disorders of optic disc, right eye: Secondary | ICD-10-CM | POA: Diagnosis not present

## 2020-11-10 NOTE — Telephone Encounter (Signed)
Pt scheduled fpr 01/17/2021

## 2020-11-10 NOTE — Telephone Encounter (Signed)
Pt schduled for 01/17/2021 stated she wanted samples of the ozempic.

## 2020-11-14 DIAGNOSIS — G4700 Insomnia, unspecified: Secondary | ICD-10-CM | POA: Diagnosis not present

## 2020-11-14 DIAGNOSIS — E1159 Type 2 diabetes mellitus with other circulatory complications: Secondary | ICD-10-CM | POA: Diagnosis not present

## 2020-11-14 DIAGNOSIS — J449 Chronic obstructive pulmonary disease, unspecified: Secondary | ICD-10-CM | POA: Diagnosis not present

## 2020-11-14 DIAGNOSIS — E669 Obesity, unspecified: Secondary | ICD-10-CM | POA: Diagnosis not present

## 2020-11-14 DIAGNOSIS — I10 Essential (primary) hypertension: Secondary | ICD-10-CM | POA: Diagnosis not present

## 2020-11-14 DIAGNOSIS — R69 Illness, unspecified: Secondary | ICD-10-CM | POA: Diagnosis not present

## 2020-11-14 DIAGNOSIS — G8929 Other chronic pain: Secondary | ICD-10-CM | POA: Diagnosis not present

## 2020-11-14 DIAGNOSIS — E785 Hyperlipidemia, unspecified: Secondary | ICD-10-CM | POA: Diagnosis not present

## 2020-11-14 DIAGNOSIS — E1151 Type 2 diabetes mellitus with diabetic peripheral angiopathy without gangrene: Secondary | ICD-10-CM | POA: Diagnosis not present

## 2020-11-17 ENCOUNTER — Telehealth: Payer: Self-pay | Admitting: Nurse Practitioner

## 2020-11-17 DIAGNOSIS — E1165 Type 2 diabetes mellitus with hyperglycemia: Secondary | ICD-10-CM

## 2020-11-17 NOTE — Telephone Encounter (Signed)
Please let patient know we have the samples of ozempic, she can come pick one up.c

## 2020-11-17 NOTE — Telephone Encounter (Signed)
Patient states she will be here tomorrow to grab sample of Ozempic prescription.

## 2020-11-18 ENCOUNTER — Telehealth: Payer: Self-pay

## 2020-11-18 NOTE — Chronic Care Management (AMB) (Signed)
  Chronic Care Management   Note  11/18/2020 Name: Tammy Guerra MRN: [id number] DOB: Sep 01, 1950  Tammy Guerra is a 70 y.o. year old female who is a primary care patient of [name], NP. I reached out to [place] by phone today in response to a referral sent by Tammy Guerra's PCP, [name], NP      Ms. Rightmyer was given information about Chronic Care Management services today including:  CCM service includes personalized support from designated clinical staff supervised by her physician, including individualized plan of care and coordination with other care providers 24/7 contact phone numbers for assistance for urgent and routine care needs. Service will only be billed when office clinical staff spend 20 minutes or more in a month to coordinate care. Only one practitioner may furnish and bill the service in a calendar month. The patient may stop CCM services at any time (effective at the end of the month) by phone call to the office staff. The patient will be responsible for cost sharing (co-pay) of up to 20% of the service fee (after annual deductible is met).  Patient agreed to services and verbal consent obtained.   Follow up plan: Telephone appointment with care management team member scheduled for:11/22/2020  Noreene Larsson, [place], [place], [place] [address] Direct Dial: [phone] [email or web address] Website: [email or web address]

## 2020-11-21 ENCOUNTER — Telehealth: Payer: Self-pay

## 2020-11-21 NOTE — Chronic Care Management (AMB) (Signed)
I spoke with patient to reschedule her CCM visit to 9:45 am on 11/22/20. Patient confirmed.

## 2020-11-22 ENCOUNTER — Telehealth: Payer: Self-pay | Admitting: Nurse Practitioner

## 2020-11-22 ENCOUNTER — Telehealth: Payer: Self-pay

## 2020-11-22 ENCOUNTER — Ambulatory Visit (INDEPENDENT_AMBULATORY_CARE_PROVIDER_SITE_OTHER): Payer: Medicare HMO

## 2020-11-22 DIAGNOSIS — E118 Type 2 diabetes mellitus with unspecified complications: Secondary | ICD-10-CM

## 2020-11-22 DIAGNOSIS — F17200 Nicotine dependence, unspecified, uncomplicated: Secondary | ICD-10-CM

## 2020-11-22 DIAGNOSIS — T466X5A Adverse effect of antihyperlipidemic and antiarteriosclerotic drugs, initial encounter: Secondary | ICD-10-CM

## 2020-11-22 MED ORDER — VARENICLINE TARTRATE 1 MG PO TABS: 1.0000 mg | ORAL_TABLET | Freq: Two times a day (BID) | ORAL | 0 refills | Status: DC

## 2020-11-22 MED ORDER — VARENICLINE TARTRATE 0.5 MG X 11 & 1 MG X 42 PO MISC: ORAL | 0 refills | Status: DC

## 2020-11-22 NOTE — Progress Notes (Signed)
Chronic Care Management Pharmacy Note  11/22/2020 Name:  Tammy Guerra MRN:  [id number] DOB:  31-May-1950  Plan: Motivated to stop smoking - patient to order NRT-lozenge 4 mg  Will review chantix start with PCP Will pursue Ozempic patient assistance with patient  Subjective: Tammy Guerra is an 70 y.o. year old female who is a primary patient of [name], NP.  The CCM team was consulted for assistance with disease management and care coordination needs.    Engaged with patient by telephone for initial visit in response to provider referral for pharmacy case management and/or care coordination services.   Consent to Services:  The patient was given the following information about Chronic Care Management services today, agreed to services, and gave verbal consent: 1. CCM service includes personalized support from designated clinical staff supervised by the primary care provider, including individualized plan of care and coordination with other care providers 2. 24/7 contact phone numbers for assistance for urgent and routine care needs. 3. Service will only be billed when office clinical staff spend 20 minutes or more in a month to coordinate care. 4. Only one practitioner may furnish and bill the service in a calendar month. 5.The patient may stop CCM services at any time (effective at the end of the month) by phone call to the office staff. 6. The patient will be responsible for cost sharing (co-pay) of up to 20% of the service fee (after annual deductible is met). Patient agreed to services and consent obtained.  Patient Care Team: [name], NP as PCP - General (Nurse Practitioner) Madelin Rear, [facility] (Pharmacist)  Hospital visits: None in previous 6 months  Objective:  Lab Results  Component Value Date   CREATININE 0.63 10/17/2020   CREATININE 0.61 07/01/2020   CREATININE 0.70 07/30/2017    Lab Results  Component Value Date   HGBA1C 6.7 (H) 10/17/2020    Last diabetic Eye exam:  Lab Results  Component Value Date/Time   HMDIABEYEEXA Retinopathy (A) 10/10/2020 12:00 AM    Last diabetic Foot exam: No results found for: HMDIABFOOTEX   No results found for: CHOL, TRIG, HDL, CHOLHDL, VLDL, LDLCALC, LDLDIRECT  Hepatic Function Latest Ref Rng & Units 10/17/2020 07/01/2020  Total Protein 6.0 - 8.5 g/dL 6.9 6.8  Albumin 3.8 - 4.8 g/dL 5.0(H) 4.7  AST 0 - 40 IU/L 19 13  ALT 0 - 32 IU/L 25 14  Alk Phosphatase 44 - 121 IU/L 75 89  Total Bilirubin 0.0 - 1.2 mg/dL 0.3 0.3    No results found for: TSH, FREET4  CBC Latest Ref Rng & Units 08/29/2020  WBC 3.4 - 10.8 x10E3/uL 10.2  Hemoglobin 11.1 - 15.9 g/dL 15.2  Hematocrit 34.0 - 46.6 % 44.2  Platelets 150 - 450 x10E3/uL 310    No results found for: VD25OH  Clinical ASCVD:  The ASCVD Risk score Mikey Bussing DC Jr., et al., 2013) failed to calculate for the following reasons:   Cannot find a previous HDL lab   Cannot find a previous total cholesterol lab    Social History   Tobacco Use  Smoking Status Light Smoker   Packs/day: 0.50   Years: 41.00   Pack years: 20.50   Types: Cigarettes  Smokeless Tobacco Never  Tobacco Comments   15 CIG DAILY   BP Readings from Last 3 Encounters:  10/17/20 121/71  10/04/20 134/85  09/15/20 115/73   Pulse Readings from Last 3 Encounters:  10/17/20 77  10/04/20 77  09/15/20  75   Wt Readings from Last 3 Encounters:  10/17/20 178 lb 12.8 oz (81.1 kg)  10/04/20 179 lb (81.2 kg)  09/23/20 180 lb (81.6 kg)    Assessment: Review of patient past medical history, allergies, medications, health status, including review of consultants reports, laboratory and other test data, was performed as part of comprehensive evaluation and provision of chronic care management services.   SDOH:  (Social Determinants of Health) assessments and interventions performed: Yes  CCM Care Plan  No Known Allergies  Medications Reviewed Today     Reviewed by Madelin Rear, [facility] (Pharmacist) on 11/22/20 at [place] List Status: <None>   Medication Order Taking? Sig Documenting Provider Last Dose Status Informant  ALPRAZolam (XANAX) 0.25 MG tablet [id number]  Take 1 tablet (0.25 mg total) by mouth as needed for anxiety. [name], NP  Active   cilostazol (PLETAL) 50 MG tablet [id number] Yes Take 1 tablet (50 mg total) by mouth 2 (two) times daily. [name], MD Taking Active   ezetimibe (ZETIA) 10 MG tablet [id number] Yes Take 1 tablet (10 mg total) by mouth daily. [name], MD  Active   hydrocortisone (ANUSOL-HC) 2.5 % rectal cream [id number]  Apply topically 2 (two) times daily. [provider]  Active   lisinopril (ZESTRIL) 2.5 MG tablet [id number] Yes Take 1 tablet (2.5 mg total) by mouth daily. [name], NP Taking Active   metFORMIN (GLUCOPHAGE-XR) 500 MG 24 hr tablet [id number] Yes TAKE 2 TABLETS BY MOUTH DAILY WITH DINNER. [name], NP Taking Active   [facility] ULTRA test strip [id number]  TEST BLOOD SUGAR EVERY DAY [name], NP  Active   rosuvastatin (CRESTOR) 20 MG tablet [id number] Yes Take 20 mg by mouth daily. [provider]  Active   Semaglutide,0.25 or 0.5MG/DOS, (OZEMPIC, 0.25 OR 0.5 MG/DOSE,) 2 MG/1.5ML SOPN [id number] Yes Inject 0.25 mg into the skin once a week. Start with 0.25MG once a week x 4 weeks, then increase to 0.5MG weekly. [name], NP  Active   traZODone (DESYREL) 50 MG tablet [id number]  TAKE 1 TABLET (50 MG TOTAL) BY MOUTH AT BEDTIME AS NEEDED FOR SLEEP (CAN TAKE 1-2 TABS AS NEEDED ). [name], NP  Active             Patient Active Problem List   Diagnosis Date Noted   Emphysema lung ([place]) 07/01/2020   Coronary artery disease 07/01/2020   Uncontrolled type 2 diabetes mellitus with hyperglycemia ([place]) 07/01/2020   Insomnia 07/01/2020   Pancreas cyst 06/27/2020   Diabetes mellitus type 2 with complications ([place]) 14/48/1856   Complex renal cyst  11/03/2018   Aortic atherosclerosis ([place]) 08/26/2018   PAD (peripheral artery disease) ([place]) 10/28/2017   Tobacco use disorder 09/24/2017   GERD (gastroesophageal reflux disease) 09/24/2017   Myalgia due to statin 09/10/2017   Lumbar spondylosis 09/10/2017   Primary osteoarthritis of both knees 09/10/2017   Trigger middle finger of right hand 09/10/2017   Lumbar pain with radiation down both legs 04/25/2016   Health care maintenance 01/05/2015   Nephrolithiasis 10/05/2014   Generalized anxiety disorder 07/05/2014    Immunization History  Administered Date(s) Administered   PFIZER(Purple Top)SARS-COV-2 Vaccination 06/18/2019, 07/09/2019   DEXA 10/2020 - normal   Conditions to be addressed/monitored: T2DM, HLD, CAD, PAD, Insomnia, Tobacco use, GERD  Care Plan : [place]  Updates made by Madelin Rear, [facility] since 11/22/2020 12:00 AM     Problem: HLD Tobacco use T2DM  PAD GAD   Priority: High     Long-Range Goal: Patient-Specific Goal   Start Date: 11/22/2020  Expected End Date: 11/22/2021  This Visit's Progress: On track  Priority: High  Note:    Current Barriers:  Unable to independently afford treatment regimen  Smoking cessation   Pharmacist Clinical Goal(s):  Patient will verbalize ability to afford treatment regimen contact provider office for questions/concerns as evidenced notation of same in electronic health record through collaboration with PharmD and provider.   Interventions: 1:1 collaboration with [name], NP regarding development and update of comprehensive plan of care as evidenced by provider attestation and co-signature Inter-disciplinary care team collaboration (see longitudinal plan of care) Comprehensive medication review performed; medication list updated in electronic medical record  Hyperlipidemia: (LDL goal < 70) -Not ideally controlled -Previously managed by Dr Ouida Sills, requesting management by PCP moving forward -Current  treatment: Rosuvastatin 20 mg once daily (Dr Ouida Sills) Ezetimibe 10 mg once daily (Dr Ouida Sills) -Educated on Importance of limiting foods high in cholesterol; Exercise goal of 150 minutes per week; -Recommended to continue current medication  Diabetes (A1c goal <7%) -Controlled -Got two samples of ozempic two months supply -Current medications: Metformin 1000 mg twice daily with meals Ozempic 0.5 mg once weekly -Medications previously tried: n/a  -Kidney protection - on lisinopril 2.5 mg  -Current home glucose readings fasting glucose: 136 this morning - had been off of ozempic due to cost however has now started back on samples. -Denies hypoglycemic/hyperglycemic symptoms -Current meal patterns:  breakfast: rye bread with peanut butter.  lunch: ham and cheese sandwich  dinner: shrimp, hush puppies snacks: nuts, crackers and cheese drinks: water and coffee -Current exercise: trying to walk 30 minutes per day -Educated on A1c and blood sugar goals; Benefits of routine self-monitoring of blood sugar; Carbohydrate counting and/or plate method -Counseled on diet and exercise extensively Recommended to continue current medication Will pursue patient assistance on ozempic   Tobacco use (Goal minimize cigarettes per day, quit smoking) -Uncontrolled -Previous quit attempts: previously quit for 6 yrs. Has tried about 6 times past several years. 2 months ago went a whole day and a half. Reports previous trial of chantix and good response to tx. Agreeable to starting on both chantix and NRT.  -7-8/day to 1 ppd  -Current treatment  none -Patient smokes Within 30 minutes of waking -Patient triggers include: drinking coffee and car, telephone -On a scale of 1-10, reports MOTIVATION to quit is 8 -On a scale of 1-10, reports CONFIDENCE in quitting is 5 -Counseled to allow lozenge to dissolve and absorb in cheek pocket, rather than swallow, to reduce GI side effects. Provided contact  information for [place] Quit Line (1-800-QUIT-NOW) and encouraged patient to reach out to this group for support. -Patient to order nictotine lozenges 4 mg - counseled on appropriate use, will review chantix with PCP due to previous success on Rx  Patient Goals/Self-Care Activities Patient will:  - take medications as prescribed engage in dietary modifications by reducing foods high in carbohydrates including rice, crackers, bread, potoatoes  Follow Up Plan: 1 month pharmacist f/u on success of tobacco cessation, DM financial support  Medication Assistance: Application for ozempic  medication assistance program. in process.  Anticipated assistance start date 12/2020.  See plan of care for additional detail.    Patient's [facility] pharmacy is:  [id number] [address]Phone: [phone]: [address](  [address]), [place]) [place] [address] Phone: [phone] Fax: [phone]  Future Appointments  Date Time Provider [place]  12/27/2020  9:00 AM CFP CCM PHARMACY [facility] P[facility]  01/05/2021  9:40 AM [name], MD [facility]  01/17/2021  9:00 AM [name], NP [facility] P[facility]  08/04/2021 10:30 AM CFP NURSE HEALTH ADVISOR [facility] P[facility]  09/15/2021 11:45 AM Stoioff, [name], MD [facility] None   Madelin Rear, PharmD, CPP Clinical Pharmacist Practitioner  [phone]

## 2020-11-22 NOTE — Telephone Encounter (Signed)
-----   Message from Madelin Rear, [facility] sent at 11/22/2020 10:57 AM EDT ----- Regarding: Chantix + NRT Hi Santiago Glad,   Patient seems pretty motivated to stop smoking and would be onboard with combination Chantix and NRT. Patient will order her own 4 mg lozenges and has been counseled on appropriate use.  Would you be ok with me sending in Rx for chantix initial and Rx for chantix maintenance?  I plan to f/u early September with pt on this.  Thanks!  Edison Nasuti

## 2020-11-22 NOTE — Chronic Care Management (AMB) (Signed)
    Chronic Care Management Pharmacy Assistant   Name: Tammy Guerra  MRN: [id number] DOB: April 26, 1950  Reason for Encounter: Patient Assistance Documentation   Medications: Outpatient Encounter Medications as of 11/22/2020  Medication Sig   ALPRAZolam (XANAX) 0.25 MG tablet Take 1 tablet (0.25 mg total) by mouth as needed for anxiety.   cilostazol (PLETAL) 50 MG tablet Take 1 tablet (50 mg total) by mouth 2 (two) times daily.   ezetimibe (ZETIA) 10 MG tablet Take 1 tablet (10 mg total) by mouth daily.   hydrocortisone (ANUSOL-HC) 2.5 % rectal cream Apply topically 2 (two) times daily.   lisinopril (ZESTRIL) 2.5 MG tablet Take 1 tablet (2.5 mg total) by mouth daily.   metFORMIN (GLUCOPHAGE-XR) 500 MG 24 hr tablet TAKE 2 TABLETS BY MOUTH DAILY WITH DINNER.   ONETOUCH ULTRA test strip TEST BLOOD SUGAR EVERY DAY   rosuvastatin (CRESTOR) 20 MG tablet Take 20 mg by mouth daily.   Semaglutide,0.25 or 0.'5MG'$ /DOS, (OZEMPIC, 0.25 OR 0.5 MG/DOSE,) 2 MG/1.5ML SOPN Inject 0.25 mg into the skin once a week. Start with 0.'25MG'$  once a week x 4 weeks, then increase to 0.'5MG'$  weekly.   traZODone (DESYREL) 50 MG tablet TAKE 1 TABLET (50 MG TOTAL) BY MOUTH AT BEDTIME AS NEEDED FOR SLEEP (CAN TAKE 1-2 TABS AS NEEDED ).   varenicline (CHANTIX CONTINUING MONTH PAK) 1 MG tablet Take 1 tablet (1 mg total) by mouth 2 (two) times daily.   varenicline (CHANTIX STARTING MONTH PAK) 0.5 MG X 11 & 1 MG X 42 tablet Take one 0.5 mg tablet by mouth once daily for 3 days, then increase to one 0.5 mg tablet twice daily for 4 days, then increase to one 1 mg tablet twice daily.   No facility-administered encounter medications on file as of 11/22/2020.   Prepared and reviewed patient assistance application for Ozempic  Informed patient that the application will be mailed to her residence today. She understands that this is to be completed and returned to the provider's office at her earliest convenience.  Wilford Sports CPA, CMA

## 2020-11-22 NOTE — Telephone Encounter (Signed)
Chantix sent to the pharmacy.

## 2020-11-22 NOTE — Patient Instructions (Signed)
Ms. Nay,  Thank you for talking with me today. I have included our care plan/goals in the following pages.   Please review and call me at [phone] with any questions.  Thanks! Ellin Mayhew, Pharm.D., BCGP Clinical Pharmacist [phone]  Patient Care Plan: [place] Plan     Problem Identified: HLD Tobacco use T2DM PAD GAD   Priority: High     Long-Range Goal: Patient-Specific Goal   Start Date: 11/22/2020  Expected End Date: 11/22/2021  This Visit's Progress: On track  Priority: High  Note:    Current Barriers:  Unable to independently afford treatment regimen  Smoking cessation   Pharmacist Clinical Goal(s):  Patient will verbalize ability to afford treatment regimen contact provider office for questions/concerns as evidenced notation of same in electronic health record through collaboration with PharmD and provider.   Interventions: 1:1 collaboration with [name], NP regarding development and update of comprehensive plan of care as evidenced by provider attestation and co-signature Inter-disciplinary care team collaboration (see longitudinal plan of care) Comprehensive medication review performed; medication list updated in electronic medical record  Hyperlipidemia: (LDL goal < 70) -Not ideally controlled -Previously managed by Dr Ouida Sills, requesting management by PCP moving forward -Current treatment: Rosuvastatin 20 mg once daily (Dr Ouida Sills) Ezetimibe 10 mg once daily (Dr Ouida Sills) -Educated on Importance of limiting foods high in cholesterol; Exercise goal of 150 minutes per week; -Recommended to continue current medication  Diabetes (A1c goal <7%) -Controlled -Got two samples of ozempic two months supply -Current medications: Metformin 1000 mg twice daily with meals Ozempic 0.5 mg once weekly -Medications previously tried: n/a  -Kidney protection - on lisinopril 2.5 mg  -Current home glucose readings fasting  glucose: 136 this morning - had been off of ozempic due to cost however has now started back on samples. -Denies hypoglycemic/hyperglycemic symptoms -Current meal patterns:  breakfast: rye bread with peanut butter.  lunch: ham and cheese sandwich  dinner: shrimp, hush puppies snacks: nuts, crackers and cheese drinks: water and coffee -Current exercise: trying to walk 30 minutes per day -Educated on A1c and blood sugar goals; Benefits of routine self-monitoring of blood sugar; Carbohydrate counting and/or plate method -Counseled on diet and exercise extensively Recommended to continue current medication Will pursue patient assistance on ozempic   Tobacco use (Goal minimize cigarettes per day, quit smoking) -Uncontrolled -Previous quit attempts: previously quit for 6 yrs. Has tried about 6 times past several years. 2 months ago went a whole day and a half. Reports previous trial of chantix and good response to tx. Agreeable to starting on both chantix and NRT.  -7-8/day to 1 ppd  -Current treatment  none -Patient smokes Within 30 minutes of waking -Patient triggers include: drinking coffee and car, telephone -On a scale of 1-10, reports MOTIVATION to quit is 8 -On a scale of 1-10, reports CONFIDENCE in quitting is 5 -Counseled to allow lozenge to dissolve and absorb in cheek pocket, rather than swallow, to reduce GI side effects. Provided contact information for [place] Quit Line (1-800-QUIT-NOW) and encouraged patient to reach out to this group for support. -Patient to order nictotine lozenges 4 mg - counseled on appropriate use, will review chantix with PCP due to previous success on Rx  Patient Goals/Self-Care Activities Patient will:  - take medications as prescribed engage in dietary modifications by reducing foods high in carbohydrates including rice, crackers, bread, potoatoes  Follow Up Plan: 1 month pharmacist f/u on success of tobacco cessation, DM  financial support  Medication  Assistance: Application for ozempic  medication assistance program. in process.  Anticipated assistance start date 12/2020.  See plan of care for additional detail.     The patient was given the following information about Chronic Care Management services today, agreed to services, and gave verbal consent: 1. CCM service includes personalized support from designated clinical staff supervised by the primary care provider, including individualized plan of care and coordination with other care providers 2. 24/7 contact phone numbers for assistance for urgent and routine care needs. 3. Service will only be billed when office clinical staff spend 20 minutes or more in a month to coordinate care. 4. Only one practitioner may furnish and bill the service in a calendar month. 5.The patient may stop CCM services at any time (effective at the end of the month) by phone call to the office staff. 6. The patient will be responsible for cost sharing (co-pay) of up to 20% of the service fee (after annual deductible is met). Patient agreed to services and consent obtained.  The patient verbalized understanding of instructions provided today and agreed to receive a MyChart copy of patient instruction and/or educational materials. Telephone follow up appointment with pharmacy team member scheduled for: See next appointment with "Care Management Staff" under "What's Next" below.

## 2020-11-30 ENCOUNTER — Telehealth: Payer: Self-pay | Admitting: *Deleted

## 2020-11-30 DIAGNOSIS — M545 Low back pain, unspecified: Secondary | ICD-10-CM | POA: Diagnosis not present

## 2020-11-30 NOTE — Telephone Encounter (Signed)
Pt brought in forms for Tammy Guerra. Placed in his folder @ [place] notified Edison Nasuti

## 2020-12-05 NOTE — Telephone Encounter (Signed)
Forms reviewed, prepared for signature

## 2020-12-09 ENCOUNTER — Other Ambulatory Visit: Payer: Self-pay | Admitting: Nurse Practitioner

## 2020-12-09 NOTE — Telephone Encounter (Signed)
  Notes to clinic:  REQUEST FOR 90 DAYS PRESCRIPTION   Requested Prescriptions  Pending Prescriptions Disp Refills   traZODone (DESYREL) 50 MG tablet [Pharmacy Med Name: TRAZODONE 50 MG TABLET] 180 tablet 1    Sig: TAKE 1 TABLET (50 MG TOTAL) BY MOUTH AT BEDTIME AS NEEDED FOR SLEEP (CAN TAKE 1-2 TABS AS NEEDED ).     Psychiatry: Antidepressants - Serotonin Modulator Passed - 12/09/2020  8:32 AM      Passed - Valid encounter within last 6 months    Recent Outpatient Visits           1 month ago Uncontrolled type 2 diabetes mellitus with hyperglycemia ([name])   [facility] [name], NP   3 months ago Acute cystitis with hematuria   [place], NP   4 months ago Encounter for screening for osteoporosis   [facility] [name], NP   5 months ago Aortic atherosclerosis [facility])   [facility] [name], NP       Future Appointments             In 3 weeks [name], MD [facility], [name]   In 1 month [name], NP [name], [place]   In 7 months  [name], [name]   In 9 months [place], [name], MD [facility]

## 2020-12-27 ENCOUNTER — Telehealth: Payer: Self-pay

## 2021-01-05 ENCOUNTER — Encounter: Payer: Self-pay | Admitting: Cardiovascular Disease

## 2021-01-05 ENCOUNTER — Other Ambulatory Visit: Payer: Self-pay

## 2021-01-05 ENCOUNTER — Ambulatory Visit: Payer: Medicare HMO | Admitting: Cardiovascular Disease

## 2021-01-05 VITALS — BP 120/60 | HR 63 | Ht 66.0 in | Wt 180.1 lb

## 2021-01-05 DIAGNOSIS — I2584 Coronary atherosclerosis due to calcified coronary lesion: Secondary | ICD-10-CM

## 2021-01-05 DIAGNOSIS — I739 Peripheral vascular disease, unspecified: Secondary | ICD-10-CM

## 2021-01-05 DIAGNOSIS — I2510 Atherosclerotic heart disease of native coronary artery without angina pectoris: Secondary | ICD-10-CM

## 2021-01-05 DIAGNOSIS — E785 Hyperlipidemia, unspecified: Secondary | ICD-10-CM | POA: Diagnosis not present

## 2021-01-05 DIAGNOSIS — Z720 Tobacco use: Secondary | ICD-10-CM | POA: Diagnosis not present

## 2021-01-05 MED ORDER — CILOSTAZOL 100 MG PO TABS: 100.0000 mg | ORAL_TABLET | Freq: Two times a day (BID) | ORAL | 2 refills | Status: DC

## 2021-01-05 NOTE — Patient Instructions (Signed)
Medication Instructions:  Your physician has recommended you make the following change in your medication:   INCREASE Pletal to 100 mg twice a day. An Rx has been sent to your pharmacy.  *If you need a refill on your cardiac medications before your next appointment, please call your pharmacy*   Lab Work: None ordered If you have labs (blood work) drawn today and your tests are completely normal, you will receive your results only by: [place] (if you have MyChart) OR A paper copy in the mail If you have any lab test that is abnormal or we need to change your treatment, we will call you to review the results.   Testing/Procedures: None ordered   Follow-Up: At [facility], you and your health needs are our priority.  As part of our continuing mission to provide you with exceptional heart care, we have created designated Provider Care Teams.  These Care Teams include your primary Cardiologist (physician) and Advanced Practice Providers (APPs -  Physician Assistants and Nurse Practitioners) who all work together to provide you with the care you need, when you need it.  We recommend signing up for the patient portal called "MyChart".  Sign up information is provided on this After Visit Summary.  MyChart is used to connect with patients for Virtual Visits (Telemedicine).  Patients are able to view lab/test results, encounter notes, upcoming appointments, etc.  Non-urgent messages can be sent to your provider as well.   To learn more about what you can do with MyChart, go to [email or web address].    Your next appointment:   4 month(s)  The format for your next appointment:   In Person  Provider:   You may see Kathlyn [place], MD or one of the following Advanced Practice Providers on your designated Care Team:   Murray Hodgkins, NP Christell Faith, PA-C Marrianne Mood, PA-C Cadence Kathlen Mody, [place]   Other Instructions N/A

## 2021-01-05 NOTE — Progress Notes (Signed)
Cardiology Office Note   Date:  01/05/2021   ID:  Tammy Guerra, DOB 07/02/1950, MRN [id number]  PCP:  [name], NP  Cardiologist:  Dr. Rockey Situ  Chief Complaint  Patient presents with   3 month follow up     Patient c/o right foot swelling and shortness of breath with walking. Medications reviewed by the patient verbally.        History of Present Illness: Tammy Guerra is a 70 y.o. female who presents for a follow-up visit regarding peripheral arterial disease.   She has past medical history of pancreatic cyst, anxiety, depression, hyperlipidemia, diabetes mellitus, tobacco use and aortic and coronary calcifications. She was seen recently for prolonged symptoms of bilateral calf claudication as well as some discomfort in the hips and thighs. Noninvasive vascular studies showed an ABI of 0.92 on the right and 0.63 on the left.  Duplex showed severe stenosis of the right popliteal artery with chronically occluded left SFA throughout its whole length  I started her on cilostazol and recommended smoking cessation as well as a walking exercise program.  She is tolerating cilostazol with no issues but has not started a walking program.  Fortunately, she has been able to quit smoking and she was congratulated.  No chest pain or shortness of breath.  Past Medical History:  Diagnosis Date   Anxiety    Arthritis    Cancer ([place])    skin/CERVICAL CA   Complication of anesthesia    discomfort during first cataract   Diabetes mellitus without complication (HCC)    Emphysema lung ([place]) 07/01/2020   GERD (gastroesophageal reflux disease)    NO MEDS   History of kidney stones    STONES AND CYSTS   Kidney stone    Palpitations    Pancreatic cyst     Past Surgical History:  Procedure Laterality Date   ABDOMINAL HYSTERECTOMY     CARDIAC CATHETERIZATION     CATARACT EXTRACTION W/PHACO Left 09/07/2014   Procedure: CATARACT EXTRACTION PHACO AND INTRAOCULAR LENS PLACEMENT  ([place]);  Surgeon: Birder Robson, MD;  Location: [facility] ORS;  Service: Ophthalmology;  Laterality: Left;  [place] 01:03 AP% 27.7 CDE 17.55   CORONARY ANGIOPLASTY     EYE SURGERY     cataract   KNEE ARTHROSCOPY WITH MEDIAL MENISECTOMY Right 05/30/2017   Procedure: KNEE ARTHROSCOPY WITH MEDIAL AND LATERAL  MENISECTOMY;  Surgeon: Hessie Knows, MD;  Location: [facility] ORS;  Service: Orthopedics;  Laterality: Right;   SYNOVECTOMY Right 05/30/2017   Procedure: SYNOVECTOMY;  Surgeon: Hessie Knows, MD;  Location: [facility] ORS;  Service: Orthopedics;  Laterality: Right;     Current Outpatient Medications  Medication Sig Dispense Refill   ALPRAZolam (XANAX) 0.25 MG tablet Take 1 tablet (0.25 mg total) by mouth as needed for anxiety. 30 tablet 0   cilostazol (PLETAL) 50 MG tablet Take 1 tablet (50 mg total) by mouth 2 (two) times daily. 60 tablet 5   ezetimibe (ZETIA) 10 MG tablet Take 1 tablet (10 mg total) by mouth daily. 90 tablet 3   hydrocortisone (ANUSOL-HC) 2.5 % rectal cream Apply topically 2 (two) times daily.     lisinopril (ZESTRIL) 2.5 MG tablet Take 1 tablet (2.5 mg total) by mouth daily. 90 tablet 1   metFORMIN (GLUCOPHAGE-XR) 500 MG 24 hr tablet TAKE 2 TABLETS BY MOUTH DAILY WITH DINNER. 180 tablet 0   ONETOUCH ULTRA test strip TEST BLOOD SUGAR EVERY DAY 100 strip 1   rosuvastatin (CRESTOR) 20 MG  tablet Take 20 mg by mouth daily.     Semaglutide,0.25 or 0.'5MG'$ /DOS, (OZEMPIC, 0.25 OR 0.5 MG/DOSE,) 2 MG/1.5ML SOPN Inject 0.25 mg into the skin once a week. Start with 0.'25MG'$  once a week x 4 weeks, then increase to 0.'5MG'$  weekly. 6 mL 1   traZODone (DESYREL) 50 MG tablet TAKE 1 TABLET (50 MG TOTAL) BY MOUTH AT BEDTIME AS NEEDED FOR SLEEP (CAN TAKE 1-2 TABS AS NEEDED ). 60 tablet 0   varenicline (CHANTIX CONTINUING MONTH PAK) 1 MG tablet Take 1 tablet (1 mg total) by mouth 2 (two) times daily. (Patient not taking: Reported on 01/05/2021) 60 tablet 0   varenicline (CHANTIX STARTING MONTH PAK) 0.5 MG X 11 & 1 MG  X 42 tablet Take one 0.5 mg tablet by mouth once daily for 3 days, then increase to one 0.5 mg tablet twice daily for 4 days, then increase to one 1 mg tablet twice daily. (Patient not taking: Reported on 01/05/2021) 53 tablet 0   No current facility-administered medications for this visit.    Allergies:   Patient has no known allergies.    Social History:  The patient  reports that she has been smoking cigarettes. She has a 20.50 pack-year smoking history. She has never used smokeless tobacco. She reports that she does not drink alcohol and does not use drugs.   Family History:  The patient's family history includes Alcohol abuse in her brother and father; Cancer in her brother and paternal aunt; Diabetes in her brother, brother, and father; Early death in her brother, brother, brother, and father; Heart disease in her brother and father; Stroke in her brother and father.    ROS:  Please see the history of present illness.   Otherwise, review of systems are positive for none.   All other systems are reviewed and negative.    PHYSICAL EXAM: VS:  BP 120/60 (BP Location: Left Arm, Patient Position: Sitting, Cuff Size: Normal)   Pulse 63   Ht '5\' 6"'$  (1.676 m)   Wt 180 lb 2 oz (81.7 kg)   SpO2 98%   BMI 29.07 kg/m  , BMI Body mass index is 29.07 kg/m. GEN: Well nourished, well developed, in no acute distress  HEENT: normal  Neck: no JVD, carotid bruits, or masses Cardiac: RRR; no murmurs, rubs, or gallops,no edema  Respiratory:  clear to auscultation bilaterally, normal work of breathing GI: soft, nontender, nondistended, + BS MS: no deformity or atrophy  Skin: warm and dry, no rash Neuro:  Strength and sensation are intact Psych: euthymic mood, full affect Vascular: Radial pulse: Normal bilaterally.  Femoral pulses are normal.  Distal pulses are not palpable   EKG:  EKG is ordered today. The ekg ordered today demonstrates normal sinus rhythm with nonspecific T wave changes.  Low  voltage.   Recent Labs: 08/29/2020: Hemoglobin 15.2; Platelets 310 10/17/2020: ALT 25; BUN 9; Creatinine, Ser 0.63; Potassium 4.3; Sodium 140    Lipid Panel No results found for: CHOL, TRIG, HDL, CHOLHDL, VLDL, LDLCALC, LDLDIRECT    Wt Readings from Last 3 Encounters:  01/05/21 180 lb 2 oz (81.7 kg)  10/17/20 178 lb 12.8 oz (81.1 kg)  10/04/20 179 lb (81.2 kg)       PAD Screen 08/24/2020  Previous PAD dx? No  Previous surgical procedure? No  Pain with walking? Yes  Subsides with rest? Yes  Feet/toe relief with dangling? Yes  Painful, non-healing ulcers? No  Extremities discolored? No  ASSESSMENT AND PLAN:  1.  Peripheral arterial disease: Severe bilateral leg claudication.  We again discussed the natural history and management of claudication in the presence of her daughter.   I recommend increasing cilostazol to 100 mg twice daily.  I again discussed with her the importance of starting a walking program for at least 30 minutes daily.   Reevaluate symptoms after that.  If no improvement, angiography can be considered.  2.  Coronary artery calcifications: Currently with no anginal symptoms.  3.  Tobacco use: I congratulated her on smoking cessation.  4.  Hyperlipidemia: I reviewed most recent lipid profile done in September 2021 which showed an LDL of 81 and triglyceride of 133.  Recommend continuing ezetimibe and rosuvastatin.  She will need follow-up labs in the near future and will try to get her LDL below 70 given her peripheral vascular disease.   Disposition:   FU with me in 4 months  Signed,  Kathlyn Sacramento, MD  01/05/2021 9:49 AM    [place]

## 2021-01-07 ENCOUNTER — Other Ambulatory Visit: Payer: Self-pay | Admitting: Nurse Practitioner

## 2021-01-08 NOTE — Telephone Encounter (Signed)
Requested medication (s) are due for refill today: yes  Requested medication (s) are on the active medication list: yes  Last refill:  12/09/20 #60  Future visit scheduled: yes  Notes to clinic:  pt should have enough to last to upcoming appt 01/09/21    Requested Prescriptions  Pending Prescriptions Disp Refills   traZODone (DESYREL) 50 MG tablet [Pharmacy Med Name: TRAZODONE 50 MG TABLET] 180 tablet 1    Sig: TAKE 1 TABLET (50 MG TOTAL) BY MOUTH AT BEDTIME AS NEEDED FOR SLEEP (CAN TAKE 1-2 TABS AS NEEDED ).     Psychiatry: Antidepressants - Serotonin Modulator Passed - 01/07/2021 12:31 PM      Passed - Valid encounter within last 6 months    Recent Outpatient Visits           2 months ago Uncontrolled type 2 diabetes mellitus with hyperglycemia ([place])   [facility] [name], NP   4 months ago Acute cystitis with hematuria   [facility] [name], NP   5 months ago Encounter for screening for osteoporosis   [facility] [name], NP   6 months ago Aortic atherosclerosis [facility])   [facility] [name], NP       Future Appointments             In 1 week [name], NP [facility], [place]   In 4 months Arida, Mertie Clause, MD [place], [place]   In 6 months  [name], [place]   In 8 months Kincaid, [name], MD [place]

## 2021-01-09 ENCOUNTER — Ambulatory Visit (INDEPENDENT_AMBULATORY_CARE_PROVIDER_SITE_OTHER): Payer: Medicare HMO

## 2021-01-09 DIAGNOSIS — J439 Emphysema, unspecified: Secondary | ICD-10-CM

## 2021-01-09 DIAGNOSIS — E1165 Type 2 diabetes mellitus with hyperglycemia: Secondary | ICD-10-CM

## 2021-01-09 NOTE — Patient Instructions (Signed)
Ms. Rude,  Thank you for talking with me today. I have included our care plan/goals in the following pages.   Please review and call me at [phone] with any questions.  Thanks! Ellin Mayhew, PharmD, CPP Clinical Pharmacist Practitioner  [phone]  Care Plan : [place]  Updates made by Madelin Rear, [facility] since 01/09/2021 12:00 AM     Problem: HLD Tobacco use T2DM PAD GAD   Priority: High     Long-Range Goal: Patient-Specific Goal   Start Date: 11/22/2020  Expected End Date: 11/22/2021  This Visit's Progress: On track  Recent Progress: On track  Priority: High  Note:   Current Barriers:  Unable to maintain control of T2DM and tobacco use - now at goal  Pharmacist Clinical Goal(s):  Patient will verbalize ability to afford treatment regimen contact provider office for questions/concerns as evidenced notation of same in electronic health record through collaboration with PharmD and provider.   Interventions: 1:1 collaboration with [name], NP regarding development and update of comprehensive plan of care as evidenced by provider attestation and co-signature Inter-disciplinary care team collaboration (see longitudinal plan of care) Comprehensive medication review performed; medication list updated in electronic medical record  Diabetes (A1c goal <7%) -Controlled -Got two samples of ozempic two months supply -Current medications: Metformin 1000 mg twice daily with meals Ozempic 0.5 mg once weekly -Medications previously tried: n/a  -Kidney protection - on lisinopril 2.5 mg  -Current home glucose readings fasting glucose: 114-123 this morning - had been off of ozempic due to cost however has now started back on samples. -Denies hypoglycemic/hyperglycemic symptoms -Current meal patterns:  breakfast: rye bread with peanut butter.  lunch: ham and cheese sandwich  dinner: shrimp, hush puppies snacks: nuts, crackers and cheese drinks:  water and coffee -Current exercise: trying to walk 30 minutes per day -Educated on A1c and blood sugar goals; Benefits of routine self-monitoring of blood sugar; Carbohydrate counting and/or plate method -Counseled on diet and exercise extensively Reviewed finances - approved for ozempic patient assistance - in process of being shipped. Recommended to continue current medication  Emphysema, Tobacco use (Goal minimize cigarettes per day, quit smoking) July 2022: Previous quit attempts: previously quit for 6 yrs. Has tried about 6 times past several years. 2 months ago went a whole day and a half. Reports previous trial of chantix and good response to tx. Agreeable to starting on both chantix and NRT. 7-8/day to 1 ppd. Smokes within 30 minutes of waking . Patient triggers include: drinking coffee and car, telephone September 2022: Successfully quit smoking on September 2nd 2022. Had taken initial chantix Rx for a couple weeks then stopped due to feeling off. At this time was very focused on stopping and being conscious of triggers. -Controlled -Total CAT Score: 0 -No previous medications -Current regimen: N/A -Reviewed CAT w/ patient - negative for any symptoms -Reviewed smoking cessation and congratulated patient on success w/ quitting -No Rx changes  Patient Goals/Self-Care Activities Patient will:  - take medications as prescribed target a minimum of 150 minutes of moderate intensity exercise weekly  Follow Up Plan: CPA DM call 3 months. CPP f/u telephone visit 6 months  Patient's preferred pharmacy is:    The patient verbalized understanding of instructions provided today and agreed to receive a MyChart copy of patient instruction and/or educational materials. Telephone follow up appointment with pharmacy team member scheduled for: See next appointment with "Care Management Staff" under "What's Next" below.

## 2021-01-09 NOTE — Progress Notes (Signed)
Chronic Care Management Pharmacy Note  01/09/2021 Name:  Tammy Guerra MRN:  [id number] DOB:  14-Dec-1950  Plan: Smoking cessation successful! Patient congratulated on efforts. Had tried chantix but felt off after a couple weeks so has stopped that as well Ozempic PAP approved - order is in process   Subjective: Tammy Guerra is an 70 y.o. year old female who is a primary patient of [name], NP.  The CCM team was consulted for assistance with disease management and care coordination needs.    Engaged with patient by telephone for initial visit in response to provider referral for pharmacy case management and/or care coordination services.   Consent to Services:  The patient was given information about Chronic Care Management services, agreed to services, and gave verbal consent prior to initiation of services.  Please see initial visit note for detailed documentation.   Patient Care Team: [name], NP as PCP - General (Nurse Practitioner) Madelin Rear, [facility] (Pharmacist)  Hospital visits: None in previous 6 months  Objective:  Lab Results  Component Value Date   CREATININE 0.63 10/17/2020   CREATININE 0.61 07/01/2020   CREATININE 0.70 07/30/2017    Lab Results  Component Value Date   HGBA1C 6.7 (H) 10/17/2020   Last diabetic Eye exam:  Lab Results  Component Value Date/Time   HMDIABEYEEXA Retinopathy (A) 10/10/2020 12:00 AM    Last diabetic Foot exam: No results found for: HMDIABFOOTEX   No results found for: CHOL, TRIG, HDL, CHOLHDL, VLDL, LDLCALC, LDLDIRECT  Hepatic Function Latest Ref Rng & Units 10/17/2020 07/01/2020  Total Protein 6.0 - 8.5 g/dL 6.9 6.8  Albumin 3.8 - 4.8 g/dL 5.0(H) 4.7  AST 0 - 40 IU/L 19 13  ALT 0 - 32 IU/L 25 14  Alk Phosphatase 44 - 121 IU/L 75 89  Total Bilirubin 0.0 - 1.2 mg/dL 0.3 0.3    No results found for: TSH, FREET4  CBC Latest Ref Rng & Units 08/29/2020  WBC 3.4 - 10.8 x10E3/uL 10.2  Hemoglobin 11.1 -  15.9 g/dL 15.2  Hematocrit 34.0 - 46.6 % 44.2  Platelets 150 - 450 x10E3/uL 310    No results found for: VD25OH  Clinical ASCVD:  The ASCVD Risk score (Arnett DK, et al., 2019) failed to calculate for the following reasons:   Cannot find a previous HDL lab   Cannot find a previous total cholesterol lab    Social History   Tobacco Use  Smoking Status Former   Packs/day: 0.50   Years: 41.00   Pack years: 20.50   Types: Cigarettes   Quit date: 12/19/2020   Years since quitting: 0.0  Smokeless Tobacco Never  Tobacco Comments   15 CIG DAILY   BP Readings from Last 3 Encounters:  01/05/21 120/60  10/17/20 121/71  10/04/20 134/85   Pulse Readings from Last 3 Encounters:  01/05/21 63  10/17/20 77  10/04/20 77   Wt Readings from Last 3 Encounters:  01/05/21 180 lb 2 oz (81.7 kg)  10/17/20 178 lb 12.8 oz (81.1 kg)  10/04/20 179 lb (81.2 kg)    Assessment: Review of patient past medical history, allergies, medications, health status, including review of consultants reports, laboratory and other test data, was performed as part of comprehensive evaluation and provision of chronic care management services.   SDOH:  (Social Determinants of Health) assessments and interventions performed: Yes  CCM Care Plan  No Known Allergies  Medications Reviewed Today     Reviewed by  Madelin Rear, [facility] (Pharmacist) on 01/09/21 at [place] List Status: <None>   Medication Order Taking? Sig Documenting Provider Last Dose Status Informant  ALPRAZolam (XANAX) 0.25 MG tablet [id number]  Take 1 tablet (0.25 mg total) by mouth as needed for anxiety. [name], NP  Active   cilostazol (PLETAL) 100 MG tablet [id number]  Take 1 tablet (100 mg total) by mouth 2 (two) times daily. [name], MD  Active   ezetimibe (ZETIA) 10 MG tablet [id number]  Take 1 tablet (10 mg total) by mouth daily. [name], MD  Expired 01/05/21 2359   hydrocortisone (ANUSOL-HC) 2.5 % rectal cream  [id number]  Apply topically 2 (two) times daily. [provider]  Active   lisinopril (ZESTRIL) 2.5 MG tablet [id number]  Take 1 tablet (2.5 mg total) by mouth daily. [name], NP  Active   meloxicam (MOBIC) 7.5 MG tablet [id number]  Take 7.5 mg by mouth daily. [provider]  Active   metFORMIN (GLUCOPHAGE-XR) 500 MG 24 hr tablet [id number] Yes TAKE 2 TABLETS BY MOUTH DAILY WITH DINNER. [name], NP Taking Active   [facility] ULTRA test strip [id number]  TEST BLOOD SUGAR EVERY DAY [name], NP  Active   rosuvastatin (CRESTOR) 20 MG tablet [id number]  Take 20 mg by mouth daily. [provider]  Active   Semaglutide,0.25 or 0.5MG/DOS, (OZEMPIC, 0.25 OR 0.5 MG/DOSE,) 2 MG/1.5ML SOPN [id number] Yes Inject 0.25 mg into the skin once a week. Start with 0.25MG once a week x 4 weeks, then increase to 0.5MG weekly. [name], NP Taking Active   traZODone (DESYREL) 50 MG tablet [id number]  TAKE 1 TABLET (50 MG TOTAL) BY MOUTH AT BEDTIME AS NEEDED FOR SLEEP (CAN TAKE 1-2 TABS AS NEEDED ). [name], NP  Active             Patient Active Problem List   Diagnosis Date Noted   Emphysema lung ([place]) 07/01/2020   Coronary artery disease 07/01/2020   Uncontrolled type 2 diabetes mellitus with hyperglycemia ([place]) 07/01/2020   Insomnia 07/01/2020   Pancreas cyst 06/27/2020   Diabetes mellitus type 2 with complications ([place]) 09/32/6712   Complex renal cyst 11/03/2018   Aortic atherosclerosis ([place]) 08/26/2018   PAD (peripheral artery disease) ([place]) 10/28/2017   Tobacco use disorder 09/24/2017   GERD (gastroesophageal reflux disease) 09/24/2017   Myalgia due to statin 09/10/2017   Lumbar spondylosis 09/10/2017   Primary osteoarthritis of both knees 09/10/2017   Trigger middle finger of right hand 09/10/2017   Lumbar pain with radiation down both legs 04/25/2016   Health care maintenance 01/05/2015   Nephrolithiasis 10/05/2014   Generalized  anxiety disorder 07/05/2014    Immunization History  Administered Date(s) Administered   PFIZER(Purple Top)SARS-COV-2 Vaccination 06/18/2019, 07/09/2019   DEXA 10/2020 - normal   Conditions to be addressed/monitored: T2DM, HLD, CAD, PAD, Insomnia, Tobacco use, GERD  Care Plan : [place]  Updates made by Madelin Rear, [facility] since 01/09/2021 12:00 AM     Problem: HLD Tobacco use T2DM PAD GAD   Priority: High     Long-Range Goal: Disease managment   Start Date: 11/22/2020  Expected End Date: 11/22/2021  This Visit's Progress: On track  Recent Progress: On track  Priority: High  Note:   Current Barriers:  Unable to maintain control of T2DM and tobacco use - now at goal  Pharmacist Clinical Goal(s):  Patient will verbalize ability to afford treatment regimen contact provider office for questions/concerns  as evidenced notation of same in electronic health record through collaboration with PharmD and provider.   Interventions: 1:1 collaboration with [name], NP regarding development and update of comprehensive plan of care as evidenced by provider attestation and co-signature Inter-disciplinary care team collaboration (see longitudinal plan of care) Comprehensive medication review performed; medication list updated in electronic medical record  Diabetes (A1c goal <7%) -Controlled -Got two samples of ozempic two months supply -Current medications: Metformin 1000 mg twice daily with meals Ozempic 0.5 mg once weekly -Medications previously tried: n/a  -Kidney protection - on lisinopril 2.5 mg  -Current home glucose readings fasting glucose: 114-123 this morning - had been off of ozempic due to cost however has now started back on samples. -Denies hypoglycemic/hyperglycemic symptoms -Current meal patterns:  breakfast: rye bread with peanut butter.  lunch: ham and cheese sandwich  dinner: shrimp, hush puppies snacks: nuts, crackers and cheese drinks: water  and coffee -Exercise: Now walking for 30 minutes each morning - stops midway due to PVD/leg pain  -Educated on A1c and blood sugar goals; Benefits of routine self-monitoring of blood sugar; -Counseled on diet and exercise extensively Reviewed finances - approved for ozempic patient assistance - in process of being shipped. Recommended to continue current medication  Emphysema, Tobacco use (Goal minimize cigarettes per day, quit smoking) July 2022: Previous quit attempts: previously quit for 6 yrs. Has tried about 6 times past several years. 2 months ago went a whole day and a half. Reports previous trial of chantix and good response to tx. Agreeable to starting on both chantix and NRT. 7-8/day to 1 ppd. Smokes within 30 minutes of waking . Patient triggers include: drinking coffee and car, telephone September 2022: Successfully quit smoking on September 2nd 2022. Had taken initial chantix Rx for a couple weeks then stopped due to feeling off. At this time was very focused on stopping and being conscious of triggers. -Controlled -Total CAT Score: 0 -No previous medications -Current regimen: N/A -Reviewed CAT w/ patient - negative for any symptoms -Reviewed smoking cessation and congratulated patient on success w/ quitting -No Rx changes  Patient Goals/Self-Care Activities Patient will:  - take medications as prescribed target a minimum of 150 minutes of moderate intensity exercise weekly  Follow Up Plan: CPA DM call 3 months. CPP f/u telephone visit 6 months  Patient's preferred pharmacy is:    [facility] [address] [place] - [place] [address] Phone: [phone] Fax: [phone]  [id number] [place] [id number]: [phone]: [phone] Future Appointments  Date Time Provider [place] 01/17/2021  9:00 AM HJon Billings NP  [facility] 05/11/2021  9:20 AM AWellington Hampshire MD [facility]  07/10/2021  1:00 PM CFP CCM PHARMACY CFP-CFP PEC  08/04/2021 10:30 AM CFP NURSE HEALTH ADVISOR CFP-CFP PE [facility] 09/15/2021 11:45 AM [place], SRonda Fairly MD [facility] None   JMadelin Rear PharmD, CPP Clinical Pharmacist Practitioner  [phone]

## 2021-01-16 NOTE — Progress Notes (Signed)
BP 117/67   Pulse 61   Ht '5\' 6"'  (1.676 m)   Wt 179 lb (81.2 kg)   BMI 28.89 kg/m    Subjective:    Patient ID: Tammy Guerra, female    DOB: 1951-03-27, 70 y.o.   MRN: [id number]  HPI: Tammy Guerra is a 70 y.o. female  Chief Complaint  Patient presents with   Diabetes   COPD Stopped smoking 12/23/20. COPD status: controlled Satisfied with current treatment?: yes Oxygen use: no Dyspnea frequency:  Cough frequency:  Rescue inhaler frequency:   Limitation of activity: no Productive cough:  Last Spirometry:  Pneumovax: Up to Date Influenza: Not up to Date  DIABETES Hypoglycemic episodes:no Polydipsia/polyuria: no Visual disturbance: no Chest pain: no Paresthesias: no Glucose Monitoring: no  Accucheck frequency: Daily  Fasting glucose: 108-120  Post prandial:  Evening:  Before meals: Taking Insulin?: no  Long acting insulin:  Short acting insulin: Blood Pressure Monitoring: not checking Retinal Examination: Up to Date Foot Exam: Not up to Date Diabetic Education: Not Completed Pneumovax: Not up to Date Influenza: Not up to Date Aspirin: no  ANXIETY Patient states her anxiety has been a little worse recently due to stopping smoking.  She has used a few more Xanax than she had been.  However 30 tabs lasted her 2.5 months.    Denies HA, CP, SOB, dizziness, palpitations, visual changes, and lower extremity swelling.  Relevant past medical, surgical, family and social history reviewed and updated as indicated. Interim medical history since our last visit reviewed. Allergies and medications reviewed and updated.  Review of Systems  Per HPI unless specifically indicated above     Objective:    BP 117/67   Pulse 61   Ht '5\' 6"'  (1.676 m)   Wt 179 lb (81.2 kg)   BMI 28.89 kg/m   Wt Readings from Last 3 Encounters:  01/17/21 179 lb (81.2 kg)  01/05/21 180 lb 2 oz (81.7 kg)  10/17/20 178 lb 12.8 oz (81.1 kg)    Physical Exam Vitals and nursing  note reviewed.  Constitutional:      General: She is not in acute distress.    Appearance: Normal appearance. She is normal weight. She is not ill-appearing, toxic-appearing or diaphoretic.  HENT:     Head: Normocephalic.     Right Ear: External ear normal.     Left Ear: External ear normal.     Nose: Nose normal.     Mouth/Throat:     Mouth: Mucous membranes are moist.     Pharynx: Oropharynx is clear.  Eyes:     General:        Right eye: No discharge.        Left eye: No discharge.     Extraocular Movements: Extraocular movements intact.     Conjunctiva/sclera: Conjunctivae normal.     Pupils: Pupils are equal, round, and reactive to light.  Cardiovascular:     Rate and Rhythm: Normal rate and regular rhythm.     Heart sounds: No murmur heard. Pulmonary:     Effort: Pulmonary effort is normal. No respiratory distress.     Breath sounds: Normal breath sounds. No wheezing or rales.  Musculoskeletal:     Cervical back: Normal range of motion and neck supple.  Skin:    General: Skin is warm and dry.     Capillary Refill: Capillary refill takes less than 2 seconds.  Neurological:     General: No focal deficit present.  Mental Status: She is alert and oriented to person, place, and time. Mental status is at baseline.  Psychiatric:        Mood and Affect: Mood normal.        Behavior: Behavior normal.        Thought Content: Thought content normal.        Judgment: Judgment normal.    Results for orders placed or performed in visit on 10/17/20  Comp Met (CMET)  Result Value Ref Range   Glucose 100 (H) 65 - 99 mg/dL   BUN 9 8 - 27 mg/dL   Creatinine, Ser 0.63 0.57 - 1.00 mg/dL   eGFR 97 >59 mL/min/1.73   BUN/Creatinine Ratio 14 12 - 28   Sodium 140 134 - 144 mmol/L   Potassium 4.3 3.5 - 5.2 mmol/L   Chloride 101 96 - 106 mmol/L   CO2 20 20 - 29 mmol/L   Calcium 10.0 8.7 - 10.3 mg/dL   Total Protein 6.9 6.0 - 8.5 g/dL   Albumin 5.0 (H) 3.8 - 4.8 g/dL   Globulin,  Total 1.9 1.5 - 4.5 g/dL   Albumin/Globulin Ratio 2.6 (H) 1.2 - 2.2   Bilirubin Total 0.3 0.0 - 1.2 mg/dL   Alkaline Phosphatase 75 44 - 121 IU/L   AST 19 0 - 40 IU/L   ALT 25 0 - 32 IU/L  HgB A1c  Result Value Ref Range   Hgb A1c MFr Bld 6.7 (H) 4.8 - 5.6 %   Est. average glucose Bld gHb Est-mCnc 146 mg/dL      Assessment & Plan:   Problem List Items Addressed This Visit       Cardiovascular and Mediastinum   Aortic atherosclerosis (HCC)    Chronic. Stable.  Followed by Cardiology. Continue with Crestor 34m daily.  Labs ordered today.  Follow up in 1 months.        Respiratory   Emphysema lung ([place] - Primary    Chronic. Has completely stopped smoking as of 12/23/2020. Praised for stopping.  Continue complete cessation.       Relevant Orders   Comp Met (CMET)   HgB A1c     Endocrine   Diabetes mellitus type 2 with complications (HCC)    Chronic.  Controlled.  Continue with current medication regimen of Ozempic 0.524mweekly.  Labs ordered today.  Return to clinic in 3 months for reevaluation.  Call sooner if concerns arise.        Relevant Orders   Comp Met (CMET)   HgB A1c   Lipid Profile     Other   Generalized anxiety disorder    Chronic.  Well controlled with PRN xanax.  Last refill given in July. Patient needs a refill today.  Patient is aware of the risks of long term use and side effects from taking a Benzodiazepine.  Patient would like to continue therapy.  Follow up in 3 months for reevaluation.       Relevant Medications   ALPRAZolam (XANAX) 0.25 MG tablet   Other Relevant Orders   Comp Met (CMET)     Follow up plan: Return in about 3 months (around 04/18/2021) for HTN, HLD, DM2 FU.

## 2021-01-17 ENCOUNTER — Other Ambulatory Visit: Payer: Self-pay

## 2021-01-17 ENCOUNTER — Encounter: Payer: Self-pay | Admitting: Nurse Practitioner

## 2021-01-17 ENCOUNTER — Telehealth: Payer: Self-pay

## 2021-01-17 ENCOUNTER — Ambulatory Visit (INDEPENDENT_AMBULATORY_CARE_PROVIDER_SITE_OTHER): Payer: Medicare HMO | Admitting: Nurse Practitioner

## 2021-01-17 VITALS — BP 117/67 | HR 61 | Ht 66.0 in | Wt 179.0 lb

## 2021-01-17 DIAGNOSIS — J439 Emphysema, unspecified: Secondary | ICD-10-CM | POA: Diagnosis not present

## 2021-01-17 DIAGNOSIS — I700 Atherosclerosis of aorta: Secondary | ICD-10-CM | POA: Diagnosis not present

## 2021-01-17 DIAGNOSIS — E118 Type 2 diabetes mellitus with unspecified complications: Secondary | ICD-10-CM | POA: Diagnosis not present

## 2021-01-17 DIAGNOSIS — F411 Generalized anxiety disorder: Secondary | ICD-10-CM | POA: Diagnosis not present

## 2021-01-17 DIAGNOSIS — R69 Illness, unspecified: Secondary | ICD-10-CM | POA: Diagnosis not present

## 2021-01-17 MED ORDER — ALPRAZOLAM 0.25 MG PO TABS: 0.2500 mg | ORAL_TABLET | ORAL | 0 refills | Status: DC | PRN

## 2021-01-17 NOTE — Telephone Encounter (Signed)
5 boxes of Ozempic received for the patient. Called and let her know that it was ready to be pick up.

## 2021-01-17 NOTE — Assessment & Plan Note (Signed)
Chronic.  Controlled.  Continue with current medication regimen of Ozempic 0.5mg  weekly.  Labs ordered today.  Return to clinic in 3 months for reevaluation.  Call sooner if concerns arise.

## 2021-01-17 NOTE — Assessment & Plan Note (Signed)
Chronic. Has completely stopped smoking as of 12/23/2020. Praised for stopping.  Continue complete cessation.

## 2021-01-17 NOTE — Assessment & Plan Note (Signed)
Chronic.  Well controlled with PRN xanax.  Last refill given in July. Patient needs a refill today.  Patient is aware of the risks of long term use and side effects from taking a Benzodiazepine.  Patient would like to continue therapy.  Follow up in 3 months for reevaluation.

## 2021-01-17 NOTE — Assessment & Plan Note (Signed)
Chronic. Stable.  Followed by Cardiology. Continue with Crestor 20mg  daily.  Labs ordered today.  Follow up in 1 months.

## 2021-01-18 LAB — COMPREHENSIVE METABOLIC PANEL
ALT: 26 IU/L (ref 0–32)
AST: 17 IU/L (ref 0–40)
Albumin/Globulin Ratio: 2.3 — ABNORMAL HIGH (ref 1.2–2.2)
Albumin: 4.3 g/dL (ref 3.8–4.8)
Alkaline Phosphatase: 81 IU/L (ref 44–121)
BUN/Creatinine Ratio: 20 (ref 12–28)
BUN: 12 mg/dL (ref 8–27)
Bilirubin Total: 0.3 mg/dL (ref 0.0–1.2)
CO2: 23 mmol/L (ref 20–29)
Calcium: 9.6 mg/dL (ref 8.7–10.3)
Chloride: 101 mmol/L (ref 96–106)
Creatinine, Ser: 0.59 mg/dL (ref 0.57–1.00)
Globulin, Total: 1.9 g/dL (ref 1.5–4.5)
Glucose: 124 mg/dL — ABNORMAL HIGH (ref 70–99)
Potassium: 4.3 mmol/L (ref 3.5–5.2)
Sodium: 140 mmol/L (ref 134–144)
Total Protein: 6.2 g/dL (ref 6.0–8.5)
eGFR: 98 mL/min/{1.73_m2} (ref >59)

## 2021-01-18 LAB — LIPID PANEL
Chol/HDL Ratio: 2.1 ratio (ref 0.0–4.4)
Cholesterol, Total: 103 mg/dL (ref 100–199)
HDL: 50 mg/dL (ref >39)
LDL Chol Calc (NIH): 35 mg/dL (ref 0–99)
Triglycerides: 95 mg/dL (ref 0–149)
VLDL Cholesterol Cal: 18 mg/dL (ref 5–40)

## 2021-01-18 LAB — HEMOGLOBIN A1C
Est. average glucose Bld gHb Est-mCnc: 146 mg/dL
Hgb A1c MFr Bld: 6.7 % — ABNORMAL HIGH (ref 4.8–5.6)

## 2021-01-18 NOTE — Progress Notes (Signed)
Hi Tammy Guerra.  Your lab work looks good.  A1c remains well controlled at 6.7.  This is great news.  Keep up the good work, I will see you at our next visit.

## 2021-01-20 DIAGNOSIS — J439 Emphysema, unspecified: Secondary | ICD-10-CM

## 2021-01-20 DIAGNOSIS — E1165 Type 2 diabetes mellitus with hyperglycemia: Secondary | ICD-10-CM

## 2021-02-08 DIAGNOSIS — E119 Type 2 diabetes mellitus without complications: Secondary | ICD-10-CM | POA: Diagnosis not present

## 2021-02-08 LAB — HM DIABETES EYE EXAM

## 2021-02-09 ENCOUNTER — Other Ambulatory Visit: Payer: Self-pay | Admitting: Nurse Practitioner

## 2021-02-09 NOTE — Telephone Encounter (Signed)
Medication Refill - Medication: metFORMIN (GLUCOPHAGE-XR) 500 MG 24 hr tablet  Has the patient contacted their pharmacy? Yes.    (Agent: If yes, when and what did the pharmacy advise?) Patient states a previous physician prescribed this medication therefore the pharmacy unable to send in a  request.  Preferred Pharmacy (with phone number or street name):  [facility] [place] [facility] [place] - [address] Phone:  [phone]  Fax:  [phone]      Has the patient been seen for an appointment in the last year OR does the patient have an upcoming appointment? Yes.    Agent: Please be advised that RX refills may take up to 3 business days. We ask that you follow-up with your pharmacy.

## 2021-02-10 MED ORDER — METFORMIN HCL ER 500 MG PO TB24: ORAL_TABLET | ORAL | 1 refills | Status: DC

## 2021-02-10 NOTE — Telephone Encounter (Signed)
Requested medications are due for refill today yes  Requested medications are on the active medication list yes  Last visit 01/17/21  Future visit scheduled 04/18/21  Notes to clinic rx is missing signature/instructions, please assess.  Requested Prescriptions  Pending Prescriptions Disp Refills   metFORMIN (GLUCOPHAGE-XR) 500 MG 24 hr tablet 180 tablet 0     Endocrinology:  Diabetes - Biguanides Passed - 02/09/2021  5:03 PM      Passed - Cr in normal range and within 360 days    Creatinine, Ser  Date Value Ref Range Status  01/17/2021 0.59 0.57 - 1.00 mg/dL Final          Passed - HBA1C is between 0 and 7.9 and within 180 days    Hgb A1c MFr Bld  Date Value Ref Range Status  01/17/2021 6.7 (H) 4.8 - 5.6 % Final    Comment:             Prediabetes: 5.7 - 6.4          Diabetes: >6.4          Glycemic control for adults with diabetes: <7.0           Passed - eGFR in normal range and within 360 days    GFR calc Af Amer  Date Value Ref Range Status  05/27/2017 >60 >60 mL/min Final    Comment:    (NOTE) The eGFR has been calculated using the CKD EPI equation. This calculation has not been validated in all clinical situations. eGFR's persistently <60 mL/min signify possible Chronic Kidney Disease.    GFR calc non Af Amer  Date Value Ref Range Status  05/27/2017 >60 >60 mL/min Final   eGFR  Date Value Ref Range Status  01/17/2021 98 >59 mL/min/1.73 Final          Passed - Valid encounter within last 6 months    Recent Outpatient Visits           3 weeks ago Pulmonary emphysema, unspecified emphysema type ([place])   [facility] [name], NP   3 months ago Uncontrolled type 2 diabetes mellitus with hyperglycemia ([place])   [facility] [name], NP   5 months ago Acute cystitis with hematuria   [place], NP   7 months ago Encounter for screening for osteoporosis   [facility]  [name], NP   7 months ago Aortic atherosclerosis [facility])   [facility] [name], NP       Future Appointments             In 2 months [name], NP [facility], [place]   In 3 months Arida, Mertie Clause, MD [facility], [place]   In 5 months  [name], [place]   In 7 months [place], [name], MD [employer]

## 2021-02-21 ENCOUNTER — Encounter (INDEPENDENT_AMBULATORY_CARE_PROVIDER_SITE_OTHER): Payer: Self-pay

## 2021-03-11 ENCOUNTER — Other Ambulatory Visit: Payer: Self-pay | Admitting: Nurse Practitioner

## 2021-03-11 NOTE — Telephone Encounter (Signed)
Requested medication (s) are due for refill today: no  Requested medication (s) are on the active medication list: yes  Last refill:  01/09/21 #180 1 RF  Future visit scheduled: yes  Notes to clinic:  Request for 90 day refill.   Requested Prescriptions  Pending Prescriptions Disp Refills   traZODone (DESYREL) 50 MG tablet [Pharmacy Med Name: TRAZODONE 50 MG TABLET] 270 tablet 2    Sig: TAKE 1 TABLET (50 MG TOTAL) BY MOUTH AT BEDTIME AS NEEDED FOR SLEEP (CAN TAKE 1-2 TABS AS NEEDED ).     Psychiatry: Antidepressants - Serotonin Modulator Passed - 03/11/2021  9:38 AM      Passed - Valid encounter within last 6 months    Recent Outpatient Visits           1 month ago Pulmonary emphysema, unspecified emphysema type ([place])   [facility] [name], NP   4 months ago Uncontrolled type 2 diabetes mellitus with hyperglycemia ([place])   [facility] [name], NP   6 months ago Acute cystitis with hematuria   [facility] [name], NP   8 months ago Encounter for screening for osteoporosis   [facility] [name], NP   8 months ago Aortic atherosclerosis [facility])   [facility] [name], NP       Future Appointments             In 1 month [name], NP [facility], [place]   In 2 months Arida, Mertie Clause, MD [place], [place]   In 4 months  [name], [place]   In 6 months [place], [name], [facility]

## 2021-04-08 ENCOUNTER — Other Ambulatory Visit: Payer: Self-pay | Admitting: Cardiovascular Disease

## 2021-04-10 ENCOUNTER — Other Ambulatory Visit: Payer: Self-pay | Admitting: Cardiovascular Disease

## 2021-04-10 NOTE — Telephone Encounter (Signed)
Called and spoke with patient to get clarification on strength of Cilostazol. Per patient she is currently taking 100 mg twice a day as this was changed at her last OV. Rx received from pharmacy for Cilostazol 50mg  was refused.

## 2021-04-14 ENCOUNTER — Other Ambulatory Visit: Payer: Self-pay | Admitting: Nurse Practitioner

## 2021-04-14 DIAGNOSIS — E1165 Type 2 diabetes mellitus with hyperglycemia: Secondary | ICD-10-CM

## 2021-04-14 NOTE — Progress Notes (Deleted)
There were no vitals taken for this visit.   Subjective:    Patient ID: Tammy Guerra, female    DOB: 1951-03-16, 70 y.o.   MRN: [id number]  HPI: Tammy Guerra is a 70 y.o. female  No chief complaint on file.  COPD Stopped smoking 12/23/20. COPD status: controlled Satisfied with current treatment?: yes Oxygen use: no Dyspnea frequency:  Cough frequency:  Rescue inhaler frequency:   Limitation of activity: no Productive cough:  Last Spirometry:  Pneumovax: Up to Date Influenza: Not up to Date  DIABETES Hypoglycemic episodes:no Polydipsia/polyuria: no Visual disturbance: no Chest pain: no Paresthesias: no Glucose Monitoring: no  Accucheck frequency: Daily  Fasting glucose: 108-120  Post prandial:  Evening:  Before meals: Taking Insulin?: no  Long acting insulin:  Short acting insulin: Blood Pressure Monitoring: not checking Retinal Examination: Up to Date Foot Exam: Not up to Date Diabetic Education: Not Completed Pneumovax: Not up to Date Influenza: Not up to Date Aspirin: no  ANXIETY Patient states her anxiety has been a little worse recently due to stopping smoking.  She has used a few more Xanax than she had been.  However 30 tabs lasted her 2.5 months.    Denies HA, CP, SOB, dizziness, palpitations, visual changes, and lower extremity swelling.  Relevant past medical, surgical, family and social history reviewed and updated as indicated. Interim medical history since our last visit reviewed. Allergies and medications reviewed and updated.  Review of Systems  Per HPI unless specifically indicated above     Objective:    There were no vitals taken for this visit.  Wt Readings from Last 3 Encounters:  01/17/21 179 lb (81.2 kg)  01/05/21 180 lb 2 oz (81.7 kg)  10/17/20 178 lb 12.8 oz (81.1 kg)    Physical Exam Vitals and nursing note reviewed.  Constitutional:      General: She is not in acute distress.    Appearance: Normal appearance.  She is normal weight. She is not ill-appearing, toxic-appearing or diaphoretic.  HENT:     Head: Normocephalic.     Right Ear: External ear normal.     Left Ear: External ear normal.     Nose: Nose normal.     Mouth/Throat:     Mouth: Mucous membranes are moist.     Pharynx: Oropharynx is clear.  Eyes:     General:        Right eye: No discharge.        Left eye: No discharge.     Extraocular Movements: Extraocular movements intact.     Conjunctiva/sclera: Conjunctivae normal.     Pupils: Pupils are equal, round, and reactive to light.  Cardiovascular:     Rate and Rhythm: Normal rate and regular rhythm.     Heart sounds: No murmur heard. Pulmonary:     Effort: Pulmonary effort is normal. No respiratory distress.     Breath sounds: Normal breath sounds. No wheezing or rales.  Musculoskeletal:     Cervical back: Normal range of motion and neck supple.  Skin:    General: Skin is warm and dry.     Capillary Refill: Capillary refill takes less than 2 seconds.  Neurological:     General: No focal deficit present.     Mental Status: She is alert and oriented to person, place, and time. Mental status is at baseline.  Psychiatric:        Mood and Affect: Mood normal.  Behavior: Behavior normal.        Thought Content: Thought content normal.        Judgment: Judgment normal.    Results for orders placed or performed in visit on 02/09/21  HM DIABETES EYE EXAM  Result Value Ref Range   HM Diabetic Eye Exam No Retinopathy No Retinopathy      Assessment & Plan:   Problem List Items Addressed This Visit      Cardiovascular and Mediastinum   PAD (peripheral artery disease) ([place])   Aortic atherosclerosis ([place]) - Primary     Respiratory   Emphysema lung ([employer])     Endocrine   Uncontrolled type 2 diabetes mellitus with hyperglycemia ([place])   Diabetes mellitus type 2 with complications ([place])     Other   Generalized anxiety disorder     Follow up plan: No follow-ups on  file.

## 2021-04-14 NOTE — Telephone Encounter (Signed)
Requested Prescriptions  Pending Prescriptions Disp Refills   lisinopril (ZESTRIL) 2.5 MG tablet [Pharmacy Med Name: Lisinopril 2.5 MG Oral Tablet] 90 tablet 0    Sig: Take 1 tablet by mouth once daily     Cardiovascular:  ACE Inhibitors Passed - 04/14/2021  7:22 AM      Passed - Cr in normal range and within 180 days    Creatinine, Ser  Date Value Ref Range Status  01/17/2021 0.59 0.57 - 1.00 mg/dL Final         Passed - K in normal range and within 180 days    Potassium  Date Value Ref Range Status  01/17/2021 4.3 3.5 - 5.2 mmol/L Final         Passed - Patient is not pregnant      Passed - Last BP in normal range    BP Readings from Last 1 Encounters:  01/17/21 117/67         Passed - Valid encounter within last 6 months    Recent Outpatient Visits          2 months ago Pulmonary emphysema, unspecified emphysema type ([place])   [facility] [name], NP   5 months ago Uncontrolled type 2 diabetes mellitus with hyperglycemia ([place])   [facility] [name], NP   7 months ago Acute cystitis with hematuria   [facility] [name], NP   9 months ago Encounter for screening for osteoporosis   [facility] [name], NP   9 months ago Aortic atherosclerosis [facility])   [facility] [name], NP      Future Appointments            In 4 days [name], NP [facility], [place]   In 3 weeks [place] An[place], Mertie Clause, MD [place], [place]   In 3 months  [name], [place]   In 5 months [place], [name], MD [place]

## 2021-04-18 ENCOUNTER — Ambulatory Visit: Payer: Medicare HMO | Admitting: Nurse Practitioner

## 2021-05-09 NOTE — Progress Notes (Signed)
BP 110/70    Pulse 65    Temp 98.1 F (36.7 C)    Wt 190 lb 3.2 oz (86.3 kg)    SpO2 98%    BMI 30.70 kg/m    Subjective:    Patient ID: Tammy Guerra, female    DOB: 04/13/1951, 71 y.o.   MRN: [id number]  HPI: Tammy Guerra is a 70 y.o. female  Chief Complaint  Patient presents with   Diabetes   Hypertension   Anxiety   skin concern    Patient states she has multiple areas on her head that feel like scabs she would like looked at. Patient states they are not painful.    Numbness    Patient states both arms and hands feel numb when she sleeps, it wakes her up. Patient states when she wakes up and moves them around the feeling goes away.    Dizziness    Patient states her cardiologist recently increased her cilastazol to 100 mg and a week after she noticed feeling dizzy. Patient noticed dizziness when she bends down.    COPD Stopped smoking 12/23/20. COPD status: controlled Satisfied with current treatment?: yes Oxygen use: no Dyspnea frequency:  not really Cough frequency: no Rescue inhaler frequency:   Limitation of activity: no Productive cough: no Last Spirometry:  Pneumovax: Up to Date Influenza: Not up to Date  DIABETES Hypoglycemic episodes:no Polydipsia/polyuria: no Visual disturbance: no Chest pain: no Paresthesias: no Glucose Monitoring: no  Accucheck frequency: Daily  Fasting glucose: 117-120  Post prandial:  Evening:  Before meals: Taking Insulin?: no  Long acting insulin:  Short acting insulin: Blood Pressure Monitoring: not checking Retinal Examination: Up to Date Foot Exam: Not up to Date Diabetic Education: Not Completed Pneumovax: Not up to Date Influenza: Not up to Date Aspirin: no  ANXIETY Patient states her anxiety has been about the same.  She rarely uses the xanax.  But having them on hand if she does need them decreases her anxiety. She is still doing well with the trazodone at bedtime.     Denies HA, CP, SOB, palpitations,  visual changes, and lower extremity swelling.  SKIN Patient states her hair is getting thinner and thinner. She feels like she has scabs in her hair.  Feels like it is dry patches.   DIZZINESS Patient states she has noticed dizziness since she started the Cilostazol.  She was put on it by her Cardiologist for the blockages in her legs.  She is also smelly smoke everyday for the last 3 weeks.  She also has phlegm in her throat.    Relevant past medical, surgical, family and social history reviewed and updated as indicated. Interim medical history since our last visit reviewed. Allergies and medications reviewed and updated.  Review of Systems  HENT:  Positive for congestion.        Smelling smoke   Eyes:  Negative for visual disturbance.  Respiratory:  Negative for cough, chest tightness and shortness of breath.   Cardiovascular:  Negative for chest pain, palpitations and leg swelling.  Neurological:  Positive for dizziness. Negative for headaches.   Per HPI unless specifically indicated above     Objective:    BP 110/70    Pulse 65    Temp 98.1 F (36.7 C)    Wt 190 lb 3.2 oz (86.3 kg)    SpO2 98%    BMI 30.70 kg/m   Wt Readings from Last 3 Encounters:  05/10/21 190 lb  3.2 oz (86.3 kg)  01/17/21 179 lb (81.2 kg)  01/05/21 180 lb 2 oz (81.7 kg)    Physical Exam Vitals and nursing note reviewed.  Constitutional:      General: She is not in acute distress.    Appearance: Normal appearance. She is normal weight. She is not ill-appearing, toxic-appearing or diaphoretic.  HENT:     Head: Normocephalic.     Right Ear: External ear normal. A middle ear effusion is present.     Left Ear: External ear normal. A middle ear effusion is present.     Nose: Nose normal.     Mouth/Throat:     Mouth: Mucous membranes are moist.     Pharynx: Oropharynx is clear.  Eyes:     General:        Right eye: No discharge.        Left eye: No discharge.     Extraocular Movements: Extraocular  movements intact.     Conjunctiva/sclera: Conjunctivae normal.     Pupils: Pupils are equal, round, and reactive to light.  Cardiovascular:     Rate and Rhythm: Normal rate and regular rhythm.     Heart sounds: No murmur heard. Pulmonary:     Effort: Pulmonary effort is normal. No respiratory distress.     Breath sounds: Normal breath sounds. No wheezing or rales.  Musculoskeletal:     Cervical back: Normal range of motion and neck supple.  Skin:    General: Skin is warm and dry.     Capillary Refill: Capillary refill takes less than 2 seconds.  Neurological:     General: No focal deficit present.     Mental Status: She is alert and oriented to person, place, and time. Mental status is at baseline.  Psychiatric:        Mood and Affect: Mood normal.        Behavior: Behavior normal.        Thought Content: Thought content normal.        Judgment: Judgment normal.    Results for orders placed or performed in visit on 02/09/21  HM DIABETES EYE EXAM  Result Value Ref Range   HM Diabetic Eye Exam No Retinopathy No Retinopathy      Assessment & Plan:   Problem List Items Addressed This Visit       Cardiovascular and Mediastinum   Aortic atherosclerosis ([place])    Followed by Cardiology. Continue with Rosuvastatin daily. Labs ordered. Will make recommendations based on lab results.       Relevant Orders   Comp Met (CMET)   Lipid Profile     Respiratory   Emphysema lung ([place]) - Primary    Chronic.  Controlled.  Continue with current medication regimen.  Labs ordered today.  Return to clinic in 6 months for reevaluation.  Call sooner if concerns arise. Smoking cessation since September 2022.       Relevant Orders   Comp Met (CMET)     Endocrine   Uncontrolled type 2 diabetes mellitus with hyperglycemia (HCC)    Chronic.  Controlled.  Continue with current medication regimen on Ozempic 0.27m and Metformin 5088mBID.  Labs ordered today.  If A1c is elevated can increase  Ozempic to 43m843mPatient would like to work on diet and exercise before increasing.  Return to clinic in 3 months for reevaluation.  Call sooner if concerns arise.        Relevant Medications   metFORMIN (GLUCOPHAGE-XR) 500  MG 24 hr tablet   Semaglutide,0.25 or 0.5MG/DOS, (OZEMPIC, 0.25 OR 0.5 MG/DOSE,) 2 MG/1.5ML SOPN   Other Relevant Orders   HgB A1c   Diabetes mellitus type 2 with complications (HCC)    Chronic.  Controlled.  Continue with current medication regimen on Ozempic 0.34m and Metformin 5065mBID.  Labs ordered today.  If A1c is elevated can increase Ozempic to 29m39mPatient would like to work on diet and exercise before increasing.  Return to clinic in 3 months for reevaluation.  Call sooner if concerns arise.        Relevant Medications   metFORMIN (GLUCOPHAGE-XR) 500 MG 24 hr tablet   Semaglutide,0.25 or 0.5MG/DOS, (OZEMPIC, 0.25 OR 0.5 MG/DOSE,) 2 MG/1.5ML SOPN   Other Relevant Orders   Comp Met (CMET)     Other   Generalized anxiety disorder    Chronic.  Well controlled with PRN xanax and Trazodone nightly.  Last refill given in September. Patient still has some left.   1 refill lasts patient about 3 months.  Follow up in 3 months for reevaluation.      Relevant Medications   ALPRAZolam (XANAX) 0.25 MG tablet   Other Visit Diagnoses     Dizziness       Recommend Zyrtec daily. If symptoms do not improve speak with Cardiologist regarding medication concerns.   Abnormal smell       Smelling smoke daily. Recommend zyrtec daily due to congestion and phlegm. If symptoms do not improve will order MRI of brain to evaluate symptoms.        Follow up plan: Return in about 3 months (around 08/08/2021) for HTN, HLD, DM2 FU.

## 2021-05-10 ENCOUNTER — Ambulatory Visit (INDEPENDENT_AMBULATORY_CARE_PROVIDER_SITE_OTHER): Payer: Medicare HMO | Admitting: Nurse Practitioner

## 2021-05-10 ENCOUNTER — Telehealth: Payer: Self-pay | Admitting: Nurse Practitioner

## 2021-05-10 ENCOUNTER — Encounter: Payer: Self-pay | Admitting: Nurse Practitioner

## 2021-05-10 ENCOUNTER — Other Ambulatory Visit: Payer: Self-pay

## 2021-05-10 VITALS — BP 110/70 | HR 65 | Temp 98.1°F | Wt 190.2 lb

## 2021-05-10 DIAGNOSIS — I700 Atherosclerosis of aorta: Secondary | ICD-10-CM | POA: Diagnosis not present

## 2021-05-10 DIAGNOSIS — R69 Illness, unspecified: Secondary | ICD-10-CM | POA: Diagnosis not present

## 2021-05-10 DIAGNOSIS — J439 Emphysema, unspecified: Secondary | ICD-10-CM

## 2021-05-10 DIAGNOSIS — E1165 Type 2 diabetes mellitus with hyperglycemia: Secondary | ICD-10-CM | POA: Diagnosis not present

## 2021-05-10 DIAGNOSIS — R431 Parosmia: Secondary | ICD-10-CM | POA: Diagnosis not present

## 2021-05-10 DIAGNOSIS — E118 Type 2 diabetes mellitus with unspecified complications: Secondary | ICD-10-CM

## 2021-05-10 DIAGNOSIS — R42 Dizziness and giddiness: Secondary | ICD-10-CM | POA: Diagnosis not present

## 2021-05-10 DIAGNOSIS — F411 Generalized anxiety disorder: Secondary | ICD-10-CM

## 2021-05-10 MED ORDER — ALPRAZOLAM 0.25 MG PO TABS: 0.2500 mg | ORAL_TABLET | ORAL | 0 refills | Status: DC | PRN

## 2021-05-10 MED ORDER — KETOCONAZOLE 1 % EX SHAM: 1.0000 "application " | MEDICATED_SHAMPOO | CUTANEOUS | 1 refills | Status: DC

## 2021-05-10 MED ORDER — METFORMIN HCL ER 500 MG PO TB24: ORAL_TABLET | ORAL | 1 refills | Status: DC

## 2021-05-10 MED ORDER — OZEMPIC (0.25 OR 0.5 MG/DOSE) 2 MG/1.5ML ~~LOC~~ SOPN: 0.5000 mg | PEN_INJECTOR | SUBCUTANEOUS | 1 refills | Status: DC

## 2021-05-10 NOTE — Telephone Encounter (Signed)
[place] called and spoke to [place], [facility] about the Xanax refill. He says it needs to state how many times per day as needed for anxiety. Advised I will send to the provider for clarification.

## 2021-05-10 NOTE — Assessment & Plan Note (Signed)
Chronic.  Controlled.  Continue with current medication regimen on Ozempic 0.5mg  and Metformin 500mg  BID.  Labs ordered today.  If A1c is elevated can increase Ozempic to 1mg . Patient would like to work on diet and exercise before increasing.  Return to clinic in 3 months for reevaluation.  Call sooner if concerns arise.

## 2021-05-10 NOTE — Assessment & Plan Note (Signed)
Chronic.  Well controlled with PRN xanax and Trazodone nightly.  Last refill given in September. Patient still has some left.   1 refill lasts patient about 3 months.  Follow up in 3 months for reevaluation.

## 2021-05-10 NOTE — Assessment & Plan Note (Signed)
Followed by Cardiology. Continue with Rosuvastatin daily. Labs ordered. Will make recommendations based on lab results.

## 2021-05-10 NOTE — Telephone Encounter (Signed)
Copied from [place] [id number]. Topic: Quick Communication - Rx Refill/Question >> May 10, 2021 11:45 AM Loma [place] wrote: Medication: ALPRAZolam Duanne Moron) 0.25 MG tablet 30 tablet 0 05/10/2021   Sig - Route: Take 1 tablet (0.25 mg total) by mouth as needed for anxiety. - Oral  Sent to pharmacy as: ALPRAZolam Duanne Moron) 0.25 MG tablet  E-Prescribing Status: Receipt confirmed by pharmacy (05/10/2021 9:16 AM EST    Pharmacy needs further clarification to how many per day, list as needed, wants clarification as only 30 pills fu to anyone at [phone] Has the patient contacted their pharmacy?n/a (Agent: If no, request that the patient contact the pharmacy for the refill. If patient does not wish to contact the pharmacy document the reason why and proceed with request.) (Agent: If yes, when and what did the pharmacy advise?)  Preferred Pharmacy (with phone number or street name): [facility] [place] - [address] [place] ([place]) [place] [address] Phone: [phone] Fax: [phone]   Has the patient been seen for an appointment in the last year OR does the patient have an upcoming appointment? Yes.    Agent: Please be advised that RX refills may take up to 3 business days. We ask that you follow-up with your pharmacy.

## 2021-05-10 NOTE — Assessment & Plan Note (Addendum)
Chronic.  Controlled.  Continue with current medication regimen.  Labs ordered today.  Return to clinic in 6 months for reevaluation.  Call sooner if concerns arise. Smoking cessation since September 2022.

## 2021-05-11 ENCOUNTER — Ambulatory Visit: Payer: Medicare HMO | Admitting: Cardiovascular Disease

## 2021-05-11 LAB — COMPREHENSIVE METABOLIC PANEL
ALT: 22 IU/L (ref 0–32)
AST: 16 IU/L (ref 0–40)
Albumin/Globulin Ratio: 2.2 (ref 1.2–2.2)
Albumin: 4.4 g/dL (ref 3.8–4.8)
Alkaline Phosphatase: 78 IU/L (ref 44–121)
BUN/Creatinine Ratio: 21 (ref 12–28)
BUN: 14 mg/dL (ref 8–27)
Bilirubin Total: 0.3 mg/dL (ref 0.0–1.2)
CO2: 23 mmol/L (ref 20–29)
Calcium: 9.6 mg/dL (ref 8.7–10.3)
Chloride: 103 mmol/L (ref 96–106)
Creatinine, Ser: 0.66 mg/dL (ref 0.57–1.00)
Globulin, Total: 2 g/dL (ref 1.5–4.5)
Glucose: 160 mg/dL — ABNORMAL HIGH (ref 70–99)
Potassium: 4.5 mmol/L (ref 3.5–5.2)
Sodium: 142 mmol/L (ref 134–144)
Total Protein: 6.4 g/dL (ref 6.0–8.5)
eGFR: 95 mL/min/{1.73_m2} (ref >59)

## 2021-05-11 LAB — HEMOGLOBIN A1C
Est. average glucose Bld gHb Est-mCnc: 148 mg/dL
Hgb A1c MFr Bld: 6.8 % — ABNORMAL HIGH (ref 4.8–5.6)

## 2021-05-11 LAB — LIPID PANEL
Chol/HDL Ratio: 2.1 ratio (ref 0.0–4.4)
Cholesterol, Total: 128 mg/dL (ref 100–199)
HDL: 61 mg/dL (ref >39)
LDL Chol Calc (NIH): 48 mg/dL (ref 0–99)
Triglycerides: 102 mg/dL (ref 0–149)
VLDL Cholesterol Cal: 19 mg/dL (ref 5–40)

## 2021-05-11 MED ORDER — ALPRAZOLAM 0.25 MG PO TABS: 0.2500 mg | ORAL_TABLET | Freq: Two times a day (BID) | ORAL | 0 refills | Status: DC | PRN

## 2021-05-11 NOTE — Telephone Encounter (Signed)
Medication resent to the pharmacy with clarification.

## 2021-05-11 NOTE — Progress Notes (Signed)
Please let patient know that her lab work looks good. A1c is well controlled at 6.8.  Keep up the good work.  No changes to medications at this time.  Follow up as discussed.

## 2021-05-11 NOTE — Addendum Note (Signed)
Addended by: [name] on: 05/11/2021 07:58 AM   Modules accepted: Orders

## 2021-05-12 ENCOUNTER — Encounter: Payer: Self-pay | Admitting: Nurse Practitioner

## 2021-05-12 DIAGNOSIS — E1165 Type 2 diabetes mellitus with hyperglycemia: Secondary | ICD-10-CM

## 2021-05-27 ENCOUNTER — Other Ambulatory Visit: Payer: Medicare HMO

## 2021-05-29 ENCOUNTER — Ambulatory Visit
Admission: RE | Admit: 2021-05-29 | Discharge: 2021-05-29 | Disposition: A | Discharge status: Home / Self Care | Payer: Medicare HMO | Source: Ambulatory Visit | Attending: Gastroenterology | Admitting: Gastroenterology

## 2021-05-29 DIAGNOSIS — N281 Cyst of kidney, acquired: Secondary | ICD-10-CM | POA: Diagnosis not present

## 2021-05-29 DIAGNOSIS — R932 Abnormal findings on diagnostic imaging of liver and biliary tract: Secondary | ICD-10-CM | POA: Diagnosis not present

## 2021-05-29 DIAGNOSIS — K862 Cyst of pancreas: Secondary | ICD-10-CM | POA: Diagnosis not present

## 2021-05-29 DIAGNOSIS — I700 Atherosclerosis of aorta: Secondary | ICD-10-CM | POA: Diagnosis not present

## 2021-05-29 MED ORDER — GADOBUTROL 1 MMOL/ML IV SOLN
8.0000 mL | Freq: Once | INTRAVENOUS | Status: AC | PRN
  Administered 2021-05-29: 8 mL via INTRAVENOUS

## 2021-06-01 ENCOUNTER — Ambulatory Visit: Payer: Medicare HMO | Admitting: Cardiovascular Disease

## 2021-06-01 ENCOUNTER — Other Ambulatory Visit: Payer: Self-pay

## 2021-06-01 ENCOUNTER — Encounter: Payer: Self-pay | Admitting: Cardiovascular Disease

## 2021-06-01 VITALS — BP 114/60 | HR 73 | Ht 65.5 in | Wt 192.0 lb

## 2021-06-01 DIAGNOSIS — I739 Peripheral vascular disease, unspecified: Secondary | ICD-10-CM | POA: Diagnosis not present

## 2021-06-01 DIAGNOSIS — I2584 Coronary atherosclerosis due to calcified coronary lesion: Secondary | ICD-10-CM

## 2021-06-01 DIAGNOSIS — I2510 Atherosclerotic heart disease of native coronary artery without angina pectoris: Secondary | ICD-10-CM | POA: Diagnosis not present

## 2021-06-01 DIAGNOSIS — E785 Hyperlipidemia, unspecified: Secondary | ICD-10-CM | POA: Diagnosis not present

## 2021-06-01 DIAGNOSIS — Z720 Tobacco use: Secondary | ICD-10-CM

## 2021-06-01 NOTE — Progress Notes (Signed)
Cardiology Office Note   Date:  06/01/2021   ID:  Tammy Guerra, DOB [birth year], MRN [id number]  PCP:  [name], NP  Cardiologist:  Dr. Rockey Situ  Chief Complaint  Patient presents with   Other    4 month f/u c/o occasional groin pain. Meds reviewed verbally with pt.       History of Present Illness: Tammy Guerra is a 71 y.o. female who presents for a follow-up visit regarding peripheral arterial disease.   She has past medical history of pancreatic cyst, anxiety, depression, hyperlipidemia, diabetes mellitus, tobacco use and aortic and coronary calcifications. She was seen last year for prolonged symptoms of bilateral calf claudication as well as some discomfort in the hips and thighs. Noninvasive vascular studies showed an ABI of 0.92 on the right and 0.63 on the left.  Duplex showed severe stenosis of the right popliteal artery with chronically occluded left SFA throughout its whole length  I started her on cilostazol and recommended smoking cessation as well as a walking exercise program.   She quit smoking since then and had improvement in symptoms with cilostazol.  She is not walking as much as she is supposed to but is planning to get a treadmill.  She denies chest pain or shortness of breath.    Past Medical History:  Diagnosis Date   Anxiety    Arthritis    Cancer ([place])    skin/CERVICAL CA   Complication of anesthesia    discomfort during first cataract   Diabetes mellitus without complication (HCC)    Emphysema lung ([place]) 07/01/2020   GERD (gastroesophageal reflux disease)    NO MEDS   History of kidney stones    STONES AND CYSTS   Kidney stone    Palpitations    Pancreatic cyst     Past Surgical History:  Procedure Laterality Date   CARDIAC CATHETERIZATION     CATARACT EXTRACTION W/PHACO Left 09/07/2014   Procedure: CATARACT EXTRACTION PHACO AND INTRAOCULAR LENS PLACEMENT ([place]);  Surgeon: Birder Robson, MD;  Location: [facility] ORS;  Service:  Ophthalmology;  Laterality: Left;  [place] 01:03 AP% 27.7 CDE 17.55   CORONARY ANGIOPLASTY     EYE SURGERY     cataract   KNEE ARTHROSCOPY WITH MEDIAL MENISECTOMY Right 05/30/2017   Procedure: KNEE ARTHROSCOPY WITH MEDIAL AND LATERAL  MENISECTOMY;  Surgeon: Hessie Knows, MD;  Location: [facility] ORS;  Service: Orthopedics;  Laterality: Right;   SYNOVECTOMY Right 05/30/2017   Procedure: SYNOVECTOMY;  Surgeon: Hessie Knows, MD;  Location: [facility] ORS;  Service: Orthopedics;  Laterality: Right;   TOTAL ABDOMINAL HYSTERECTOMY W/ BILATERAL SALPINGOOPHORECTOMY       Current Outpatient Medications  Medication Sig Dispense Refill   ALPRAZolam (XANAX) 0.25 MG tablet Take 1 tablet (0.25 mg total) by mouth 2 (two) times daily as needed for anxiety. 30 tablet 0   cilostazol (PLETAL) 100 MG tablet Take 1 tablet (100 mg total) by mouth 2 (two) times daily. 180 tablet 2   ezetimibe (ZETIA) 10 MG tablet Take 1 tablet (10 mg total) by mouth daily. 90 tablet 3   hydrocortisone (ANUSOL-HC) 2.5 % rectal cream Apply topically 2 (two) times daily.     KETOCONAZOLE, TOPICAL, 1 % SHAM Apply 1 application topically 2 (two) times a week. 200 mL 1   lisinopril (ZESTRIL) 2.5 MG tablet Take 1 tablet by mouth once daily 90 tablet 0   meloxicam (MOBIC) 7.5 MG tablet Take 7.5 mg by mouth daily.  metFORMIN (GLUCOPHAGE-XR) 500 MG 24 hr tablet TAKE 2 TABLETS BY MOUTH DAILY WITH DINNER. 180 tablet 1   ONETOUCH ULTRA test strip TEST BLOOD SUGAR EVERY DAY 100 strip 1   rosuvastatin (CRESTOR) 20 MG tablet Take 20 mg by mouth daily.     Semaglutide,0.25 or 0.5MG /DOS, (OZEMPIC, 0.25 OR 0.5 MG/DOSE,) 2 MG/1.5ML SOPN Inject 0.5 mg into the skin once a week. Start with 0.25MG  once a week x 4 weeks, then increase to 0.5MG  weekly. 6 mL 1   traZODone (DESYREL) 50 MG tablet TAKE 1 TABLET (50 MG TOTAL) BY MOUTH AT BEDTIME AS NEEDED FOR SLEEP (CAN TAKE 1-2 TABS AS NEEDED ). 270 tablet 2   No current facility-administered medications for this  visit.    Allergies:   Patient has no known allergies.    Social History:  The patient  reports that she quit smoking about 5 months ago. Her smoking use included cigarettes. She has a 20.50 pack-year smoking history. She has never used smokeless tobacco. She reports that she does not drink alcohol and does not use drugs.   Family History:  The patient's family history includes Alcohol abuse in her brother and father; Cancer in her brother and paternal aunt; Diabetes in her brother, brother, and father; Early death in her brother, brother, brother, and father; Heart disease in her brother and father; Stroke in her brother and father.    ROS:  Please see the history of present illness.   Otherwise, review of systems are positive for none.   All other systems are reviewed and negative.    PHYSICAL EXAM: VS:  BP 114/60 (BP Location: Left Arm, Patient Position: Sitting, Cuff Size: Normal)    Pulse 73    Ht 5' 5.5" (1.664 m)    Wt 192 lb (87.1 kg)    SpO2 98%    BMI 31.46 kg/m  , BMI Body mass index is 31.46 kg/m. GEN: Well nourished, well developed, in no acute distress  HEENT: normal  Neck: no JVD, carotid bruits, or masses Cardiac: RRR; no rubs, or gallops,no edema .  1 out of 6 systolic murmur in the aortic area Respiratory:  clear to auscultation bilaterally, normal work of breathing GI: soft, nontender, nondistended, + BS MS: no deformity or atrophy  Skin: warm and dry, no rash Neuro:  Strength and sensation are intact Psych: euthymic mood, full affect    EKG:  EKG is not ordered today.    Recent Labs: 08/29/2020: Hemoglobin 15.2; Platelets 310 05/10/2021: ALT 22; BUN 14; Creatinine, Ser 0.66; Potassium 4.5; Sodium 142    Lipid Panel    Component Value Date/Time   CHOL 128 05/10/2021 0920   TRIG 102 05/10/2021 0920   HDL 61 05/10/2021 0920   CHOLHDL 2.1 05/10/2021 0920   LDLCALC 48 05/10/2021 0920      Wt Readings from Last 3 Encounters:  06/01/21 192 lb (87.1 kg)   05/10/21 190 lb 3.2 oz (86.3 kg)  01/17/21 179 lb (81.2 kg)       PAD Screen 08/24/2020  Previous PAD dx? No  Previous surgical procedure? No  Pain with walking? Yes  Subsides with rest? Yes  Feet/toe relief with dangling? Yes  Painful, non-healing ulcers? No  Extremities discolored? No      ASSESSMENT AND PLAN:  1.  Peripheral arterial disease: Bilateral leg claudication with improvement in symptoms with cilostazol.  Symptoms are currently not lifestyle limiting.  I again discussed with her the importance of regular exercise  program and she is planning to get a treadmill.    2.  Coronary artery calcifications: Currently with no anginal symptoms.  3.  Previous tobacco use: She quit smoking last year with no relapse.  4.  Hyperlipidemia: I reviewed the results of her recent lipid profile with her which showed significant improvement in LDL down to 48.  Continue treatment with rosuvastatin and ezetimibe.   Disposition:   FU with me in 6 months  Signed,  Kathlyn [place], MD  06/01/2021 8:44 AM    [place]

## 2021-06-01 NOTE — Patient Instructions (Signed)

## 2021-06-05 ENCOUNTER — Ambulatory Visit (INDEPENDENT_AMBULATORY_CARE_PROVIDER_SITE_OTHER): Payer: Medicare HMO

## 2021-06-05 DIAGNOSIS — E118 Type 2 diabetes mellitus with unspecified complications: Secondary | ICD-10-CM

## 2021-06-05 DIAGNOSIS — I739 Peripheral vascular disease, unspecified: Secondary | ICD-10-CM

## 2021-06-05 NOTE — Patient Instructions (Signed)
Ms. Valenza,  Thank you for talking with me today. I have included our care plan/goals in the following pages.   Please review and call me at [phone] with any questions.  Thanks! Ellin Mayhew, PharmD Clinical Pharmacist  [phone]  Care Plan : [place]  Updates made by Madelin Rear, [facility] since 06/05/2021 12:00 AM     Problem: HLD Tobacco use T2DM PAD GAD   Priority: High     Long-Range Goal: Disease managment   Start Date: 11/22/2020  Expected End Date: 11/22/2021  Recent Progress: On track  Priority: High  Note:   Current Barriers:  Unable to maintain control of T2DM and tobacco use - now at goal  Pharmacist Clinical Goal(s):  Patient will verbalize ability to afford treatment regimen contact provider office for questions/concerns as evidenced notation of same in electronic health record through collaboration with PharmD and provider.   Interventions: 1:1 collaboration with [name], NP regarding development and update of comprehensive plan of care as evidenced by provider attestation and co-signature Inter-disciplinary care team collaboration (see longitudinal plan of care) Comprehensive medication review performed; medication list updated in electronic medical record  Diabetes (A1c goal <7%) -Controlled -Current medications: Metformin 1000 mg twice daily with meals Ozempic 0.5 mg once weekly -Medications previously tried: n/a  -Kidney protection - on lisinopril 2.5 mg  -Current home glucose readings fasting glucose: 130 this morning - had been off of ozempic due to cost however has now started back on samples. -Denies hypoglycemic/hyperglycemic symptoms -Current meal patterns:  breakfast: rye bread with peanut butter.  lunch: ham and cheese sandwich  dinner: shrimp, hush puppies snacks: nuts, crackers and cheese drinks: water and coffee -Exercise: Trying to walk more, in the past limited by pain. Would like to find a  treadmill for home use, does not really want to go to gym through silver sneakers. -Educated on A1c and blood sugar goals; Benefits of routine self-monitoring of blood sugar; -Counseled on diet and exercise extensively Reviewed finances - approved for ozempic patient assistance - in process of being shipped. Recommended to continue current medication  CAD, PAD, cholesterol: (LDL goal < 70) -Controlled -Current treatment: Rosuvastatin 20 mg once daily Zetia 10 mg daily  -Medications previously tried: n/a  -Current dietary patterns: see DM -Current exercise habits: see DM -Educated on Cholesterol goals;  Benefits of statin for ASCVD risk reduction; -Counseled on diet and exercise extensively Recommended to continue current medication  Patient Goals/Self-Care Activities Patient will:  - take medications as prescribed target a minimum of 150 minutes of moderate intensity exercise weekly  Follow Up Plan: CPA DM call 3 months. CPP f/u telephone visit 6 months  Patient's preferred pharmacy is:    The patient verbalized understanding of instructions provided today and agreed to receive a MyChart copy of patient instruction and/or educational materials. Telephone follow up appointment with pharmacy team member scheduled for: See next appointment with "Care Management Staff" under "What's Next" below.

## 2021-06-05 NOTE — Progress Notes (Signed)
Chronic Care Management Pharmacy Note  06/05/2021 Name:  Tammy Guerra MRN:  [id number] DOB:  June 16, 1950  Plan: Ozempic 0.5 mg - PAP received for one month supply - will check on this for patient; lvm with PAP for call back.  Pt to start focusing on exercise - goal of 150 minutes aerobic exercise, may start using treadmill if able to purchase one. Starting goal of 30 min 2x/wk  Subjective: Tammy Guerra is an 71 y.o. year old female who is a primary patient of [name], NP.  The CCM team was consulted for assistance with disease management and care coordination needs.    Engaged with patient by telephone for follow up visit in response to provider referral for pharmacy case management and/or care coordination services.   Consent to Services:  The patient was given information about Chronic Care Management services, agreed to services, and gave verbal consent prior to initiation of services.  Please see initial visit note for detailed documentation.   Patient Care Team: [name], NP as PCP - General (Nurse Practitioner) Madelin Rear, [facility] (Pharmacist)  Hospital visits: None in previous 6 months  Objective:  Lab Results  Component Value Date   CREATININE 0.66 05/10/2021   CREATININE 0.59 01/17/2021   CREATININE 0.63 10/17/2020   Lab Results  Component Value Date   HGBA1C 6.8 (H) 05/10/2021  Last diabetic Eye exam:  Lab Results  Component Value Date/Time   HMDIABEYEEXA No Retinopathy 02/08/2021 12:00 AM   Last diabetic Foot exam: No results found for: HMDIABFOOTEX      Component Value Date/Time   CHOL 128 05/10/2021 0920   TRIG 102 05/10/2021 0920   HDL 61 05/10/2021 0920   CHOLHDL 2.1 05/10/2021 0920   LDLCALC 48 05/10/2021 0920    Hepatic Function Latest Ref Rng & Units 05/10/2021 01/17/2021 10/17/2020  Total Protein 6.0 - 8.5 g/dL 6.4 6.2 6.9  Albumin 3.8 - 4.8 g/dL 4.4 4.3 5.0(H)  AST 0 - 40 IU/L _0 ALT 0 - 32 IU/L _1 Alk  Phosphatase 44 - 121 IU/L 78 81 75  Total Bilirubin 0.0 - 1.2 mg/dL 0.3 0.3 0.3    No results found for: TSH, FREET4  CBC Latest Ref Rng & Units 08/29/2020  WBC 3.4 - 10.8 x10E3/uL 10.2  Hemoglobin 11.1 - 15.9 g/dL 15.2  Hematocrit 34.0 - 46.6 % 44.2  Platelets 150 - 450 x10E3/uL 310    No results found for: VD25OH  Clinical ASCVD:  The ASCVD Risk score (Arnett DK, et al., 2019) failed to calculate for the following reasons:   The valid total cholesterol range is 130 to 320 mg/dL    Social History   Tobacco Use  Smoking Status Former   Packs/day: 0.50   Years: 41.00   Pack years: 20.50   Types: Cigarettes   Quit date: 12/19/2020   Years since quitting: 0.4  Smokeless Tobacco Never  Tobacco Comments   15 CIG DAILY   BP Readings from Last 3 Encounters:  06/01/21 114/60  05/10/21 110/70  01/17/21 117/67   Pulse Readings from Last 3 Encounters:  06/01/21 73  05/10/21 65  01/17/21 61   Wt Readings from Last 3 Encounters:  06/01/21 192 lb (87.1 kg)  05/10/21 190 lb 3.2 oz (86.3 kg)  01/17/21 179 lb (81.2 kg)   Assessment: Review of patient past medical history, allergies, medications, health status, including review of consultants reports, laboratory and other test data, was  performed as part of comprehensive evaluation and provision of chronic care management services.   SDOH:  (Social Determinants of Health) assessments and interventions performed: Yes SDOH Interventions    Flowsheet Row Most Recent Value  SDOH Interventions   Food Insecurity Interventions Intervention Not Indicated  Transportation Interventions Intervention Not Indicated       CCM Care Plan  No Known Allergies  Medications Reviewed Today     Reviewed by Madelin Rear, [facility] (Pharmacist) on 06/05/21 at 1531  Med List Status: <None>   Medication Order Taking? Sig Documenting Provider Last Dose Status Informant  ALPRAZolam (XANAX) 0.25 MG tablet [id number] No Take 1 tablet (0.25 mg total) by  mouth 2 (two) times daily as needed for anxiety. [name], NP Taking Active   cilostazol (PLETAL) 100 MG tablet [id number] No Take 1 tablet (100 mg total) by mouth 2 (two) times daily. [name], MD Taking Active   ezetimibe (ZETIA) 10 MG tablet [id number] No Take 1 tablet (10 mg total) by mouth daily. [name], MD Taking Expired 06/01/21 2359   hydrocortisone (ANUSOL-HC) 2.5 % rectal cream [id number] No Apply topically 2 (two) times daily. [provider] Taking Active   KETOCONAZOLE, TOPICAL, 1 % SHAM [id number] No Apply 1 application topically 2 (two) times a week. [name], NP Taking Active   lisinopril (ZESTRIL) 2.5 MG tablet [id number] No Take 1 tablet by mouth once daily [name], NP Taking Active   meloxicam (MOBIC) 7.5 MG tablet [id number] No Take 7.5 mg by mouth daily. [provider] Taking Active   metFORMIN (GLUCOPHAGE-XR) 500 MG 24 hr tablet [id number] No TAKE 2 TABLETS BY MOUTH DAILY WITH DINNER. [name], NP Taking Active   [facility] ULTRA test strip [id number] No TEST BLOOD SUGAR EVERY DAY [name], NP Taking Active   rosuvastatin (CRESTOR) 20 MG tablet [id number] No Take 20 mg by mouth daily. [provider] Taking Active   Semaglutide,0.25 or 0.5MG/DOS, (OZEMPIC, 0.25 OR 0.5 MG/DOSE,) 2 MG/1.5ML SOPN [id number] No Inject 0.5 mg into the skin once a week. Start with 0.25MG once a week x 4 weeks, then increase to 0.5MG weekly. [name], NP Taking Active   traZODone (DESYREL) 50 MG tablet [id number] No TAKE 1 TABLET (50 MG TOTAL) BY MOUTH AT BEDTIME AS NEEDED FOR SLEEP (CAN TAKE 1-2 TABS AS NEEDED ). [name], NP Taking Active             Patient Active Problem List   Diagnosis Date Noted   Emphysema lung ([place]) 07/01/2020   Coronary artery disease 07/01/2020   Uncontrolled type 2 diabetes mellitus with hyperglycemia ([place]) 07/01/2020   Insomnia 07/01/2020   Pancreas cyst 06/27/2020    Diabetes mellitus type 2 with complications ([place]) 41/96/2229   Complex renal cyst 11/03/2018   Aortic atherosclerosis ([place]) 08/26/2018   PAD (peripheral artery disease) ([place]) 10/28/2017   Tobacco use disorder 09/24/2017   GERD (gastroesophageal reflux disease) 09/24/2017   Myalgia due to statin 09/10/2017   Lumbar spondylosis 09/10/2017   Primary osteoarthritis of both knees 09/10/2017   Trigger middle finger of right hand 09/10/2017   Lumbar pain with radiation down both legs 04/25/2016   Health care maintenance 01/05/2015   Nephrolithiasis 10/05/2014   Generalized anxiety disorder 07/05/2014    Immunization History  Administered Date(s) Administered   PFIZER(Purple Top)SARS-COV-2 Vaccination 06/18/2019, 07/09/2019   DEXA 10/2020 - normal   Conditions to be addressed/monitored: T2DM, HLD, CAD, PAD, Insomnia, Tobacco use, GERD  Care Plan :  CCM Pharmacy Care Plan  Updates made by Madelin Rear, [facility] since 06/05/2021 12:00 AM     Problem: HLD Tobacco use T2DM PAD GAD   Priority: High     Long-Range Goal: Disease managment   Start Date: 11/22/2020  Expected End Date: 11/22/2021  Recent Progress: On track  Priority: High  Note:   Current Barriers:  Unable to maintain control of T2DM and tobacco use - now at goal  Pharmacist Clinical Goal(s):  Patient will verbalize ability to afford treatment regimen contact provider office for questions/concerns as evidenced notation of same in electronic health record through collaboration with PharmD and provider.   Interventions: 1:1 collaboration with [name], NP regarding development and update of comprehensive plan of care as evidenced by provider attestation and co-signature Inter-disciplinary care team collaboration (see longitudinal plan of care) Comprehensive medication review performed; medication list updated in electronic medical record  Diabetes (A1c goal <7%) -Controlled -Current medications: Metformin 1000 mg  twice daily with meals Ozempic 0.5 mg once weekly -Medications previously tried: n/a  -Kidney protection - on lisinopril 2.5 mg  -Current home glucose readings fasting glucose: 130 this morning - had been off of ozempic due to cost however has now started back on samples. -Denies hypoglycemic/hyperglycemic symptoms -Current meal patterns:  breakfast: rye bread with peanut butter.  lunch: ham and cheese sandwich  dinner: shrimp, hush puppies snacks: nuts, crackers and cheese drinks: water and coffee -Exercise: Trying to walk more, in the past limited by pain. Would like to find a treadmill for home use, does not really want to go to gym through silver sneakers. -Educated on A1c and blood sugar goals; Benefits of routine self-monitoring of blood sugar; -Counseled on diet and exercise extensively Reviewed finances - approved for ozempic patient assistance - received limited supply earlier this month - will review with PAP Recommended to continue current medication  CAD, PAD, cholesterol: (LDL goal < 70) -Controlled -Current treatment: Rosuvastatin 20 mg once daily Zetia 10 mg daily  -Medications previously tried: n/a  -Current dietary patterns: see DM -Current exercise habits: see DM -Educated on Cholesterol goals;  Benefits of statin for ASCVD risk reduction; -Counseled on diet and exercise extensively Recommended to continue current medication  Patient Goals/Self-Care Activities Patient will:  - take medications as prescribed target a minimum of 150 minutes of moderate intensity exercise weekly  Follow Up Plan: CPA DM call 3 months. CPP f/u telephone visit 6 months  Patient's preferred pharmacy is:    [facility] [address]), [place] - [place]) [place] [address] Phone: [phone] Fax: [phone]  [id number] [place] [id number]: [phone]:  [phone]  Future Appointments  Date Time Provider DPresho 08/04/2021 10:30 AM [facility]FP [facility] 09/15/2021 11:45 AM SBernardo Heater SRonda Fairly MD [facility] None  02/05/2022  9:00 AM CFP CCM PHARMACY CFP-CFP PEC   JMadelin Rear PharmD, CPP Clinical Pharmacist Practitioner  [phone]

## 2021-06-20 DIAGNOSIS — E118 Type 2 diabetes mellitus with unspecified complications: Secondary | ICD-10-CM

## 2021-07-10 ENCOUNTER — Telehealth: Payer: Medicare HMO

## 2021-07-17 ENCOUNTER — Encounter: Payer: Self-pay | Admitting: Nurse Practitioner

## 2021-07-18 MED ORDER — ROSUVASTATIN CALCIUM 20 MG PO TABS: 20.0000 mg | ORAL_TABLET | Freq: Every day | ORAL | 1 refills | Status: DC

## 2021-07-25 ENCOUNTER — Other Ambulatory Visit: Payer: Self-pay | Admitting: Nurse Practitioner

## 2021-07-25 DIAGNOSIS — E1165 Type 2 diabetes mellitus with hyperglycemia: Secondary | ICD-10-CM

## 2021-07-26 NOTE — Telephone Encounter (Signed)
Requested Prescriptions  ?Pending Prescriptions Disp Refills  ?? lisinopril (ZESTRIL) 2.5 MG tablet [Pharmacy Med Name: Lisinopril 2.5 MG Oral Tablet] 90 tablet 0  ?  Sig: Take 1 tablet by mouth once daily  ?  ? Cardiovascular:  ACE Inhibitors Passed - 07/25/2021  5:30 AM  ?  ?  Passed - Cr in normal range and within 180 days  ?  Creatinine, Ser  ?Date Value Ref Range Status  ?05/10/2021 0.66 0.57 - 1.00 mg/dL Final  ?   ?  ?  Passed - K in normal range and within 180 days  ?  Potassium  ?Date Value Ref Range Status  ?05/10/2021 4.5 3.5 - 5.2 mmol/L Final  ?   ?  ?  Passed - Patient is not pregnant  ?  ?  Passed - Last BP in normal range  ?  BP Readings from Last 1 Encounters:  ?06/01/21 114/60  ?   ?  ?  Passed - Valid encounter within last 6 months  ?  Recent Outpatient Visits   ?      ? 2 months ago Pulmonary emphysema, unspecified emphysema type ([place])  ? [place], NP  ? 6 months ago Pulmonary emphysema, unspecified emphysema type ([place])  ? [place], NP  ? 9 months ago Uncontrolled type 2 diabetes mellitus with hyperglycemia ([place])  ? [place], NP  ? 11 months ago Acute cystitis with hematuria  ? [place], NP  ? 1 year ago Encounter for screening for osteoporosis  ? [facility] [name], NP  ?  ?  ?Future Appointments   ?        ? In 1 week  [facility], P[facility]  ? In 1 month Stoioff, [name], MD [place]  ?  ? ?  ?  ?  ? ? ?

## 2021-07-31 ENCOUNTER — Telehealth: Payer: Self-pay | Admitting: Nurse Practitioner

## 2021-07-31 NOTE — Telephone Encounter (Signed)
While speaking with patient about rescheduling AWV, patient requested I send a message to provider. ? ?Patient states that [place] had informed her that Ozempic went up $100. Patient was told by Edison Nasuti that he was going to discuss this with Santiago Glad and someone would call her back.  She states she hasn't heard back and wanted to follow up. Patient is requesting someone call her back in regards to this. ?

## 2021-08-01 NOTE — Progress Notes (Signed)
I have called [employer]. They stated a new application will need to be sent in to them since the previous application was due until November 2022. I will be starting her application so the patient can sign her portion and include proof of income with it as well as PCP signature.  ? ?Corrie Mckusick, RMA ?Health Concierge ? ?

## 2021-08-04 ENCOUNTER — Telehealth: Payer: Self-pay

## 2021-08-04 ENCOUNTER — Ambulatory Visit: Payer: Medicare HMO

## 2021-08-04 NOTE — Chronic Care Management (AMB) (Signed)
? ? ?  Chronic Care Management ?Pharmacy Assistant  ? ?Name: Tammy Guerra  MRN: [id number] DOB: 1950-12-03 ? ?Reason for Encounter: Patient Assistance application Ozempic ? ? ?Medications: ?Outpatient Encounter Medications as of 08/04/2021  ?Medication Sig  ? ALPRAZolam (XANAX) 0.25 MG tablet Take 1 tablet (0.25 mg total) by mouth 2 (two) times daily as needed for anxiety.  ? cilostazol (PLETAL) 100 MG tablet Take 1 tablet (100 mg total) by mouth 2 (two) times daily.  ? ezetimibe (ZETIA) 10 MG tablet Take 1 tablet (10 mg total) by mouth daily.  ? hydrocortisone (ANUSOL-HC) 2.5 % rectal cream Apply topically 2 (two) times daily.  ? KETOCONAZOLE, TOPICAL, 1 % SHAM Apply 1 application topically 2 (two) times a week.  ? lisinopril (ZESTRIL) 2.5 MG tablet Take 1 tablet by mouth once daily  ? meloxicam (MOBIC) 7.5 MG tablet Take 7.5 mg by mouth daily.  ? metFORMIN (GLUCOPHAGE-XR) 500 MG 24 hr tablet TAKE 2 TABLETS BY MOUTH DAILY WITH DINNER.  ? ONETOUCH ULTRA test strip TEST BLOOD SUGAR EVERY DAY  ? rosuvastatin (CRESTOR) 20 MG tablet Take 1 tablet (20 mg total) by mouth daily.  ? Semaglutide,0.25 or 0.'5MG'$ /DOS, (OZEMPIC, 0.25 OR 0.5 MG/DOSE,) 2 MG/1.5ML SOPN Inject 0.5 mg into the skin once a week. Start with 0.'25MG'$  once a week x 4 weeks, then increase to 0.'5MG'$  weekly.  ? traZODone (DESYREL) 50 MG tablet TAKE 1 TABLET (50 MG TOTAL) BY MOUTH AT BEDTIME AS NEEDED FOR SLEEP (CAN TAKE 1-2 TABS AS NEEDED ).  ? ?No facility-administered encounter medications on file as of 08/04/2021.  ? ? ?I have prefilled and mailed out patient assistance application for paitent for her Ozempic due to her last PAP being expired. I have mentioned to the patient she will need to provide proof of income and to sing the highlighted areas of the form that will be mailed out to her, and once she completed her section she will need to bring it back to the office for PCP's signature as well.  ? ?Corrie Mckusick, RMA ?Health Concierge ? ?

## 2021-08-09 ENCOUNTER — Ambulatory Visit (INDEPENDENT_AMBULATORY_CARE_PROVIDER_SITE_OTHER): Payer: Medicare HMO | Admitting: *Deleted

## 2021-08-09 DIAGNOSIS — Z0000 Encounter for general adult medical examination without abnormal findings: Secondary | ICD-10-CM | POA: Diagnosis not present

## 2021-08-09 NOTE — Patient Instructions (Signed)
Tammy Guerra , ?Thank you for taking time to come for your Medicare Wellness Visit. I appreciate your ongoing commitment to your health goals. Please review the following plan we discussed and let me know if I can assist you in the future.  ? ?Screening recommendations/referrals: ?Colonoscopy: declined ?Mammogram: up to date ?Bone Density: up to date ?Recommended yearly ophthalmology/optometry visit for glaucoma screening and checkup ?Recommended yearly dental visit for hygiene and checkup ? ?Vaccinations: ?Influenza vaccine: declined ?Pneumococcal vaccine: declined ?Tdap vaccine: Education provided ?Shingles vaccine: declined   ? ?Advanced directives: Education provided ? ?Conditions/risks identified:  ? ?Next appointment: 02-05-2022 @ 9:00  Pharmacy PHONE VISIT ? ? ?Preventive Care 69 Years and Older, Female ?Preventive care refers to lifestyle choices and visits with your health care provider that can promote health and wellness. ?What does preventive care include? ?A yearly physical exam. This is also called an annual well check. ?Dental exams once or twice a year. ?Routine eye exams. Ask your health care provider how often you should have your eyes checked. ?Personal lifestyle choices, including: ?Daily care of your teeth and gums. ?Regular physical activity. ?Eating a healthy diet. ?Avoiding tobacco and drug use. ?Limiting alcohol use. ?Practicing safe sex. ?Taking low-dose aspirin every day. ?Taking vitamin and mineral supplements as recommended by your health care provider. ?What happens during an annual well check? ?The services and screenings done by your health care provider during your annual well check will depend on your age, overall health, lifestyle risk factors, and family history of disease. ?Counseling  ?Your health care provider may ask you questions about your: ?Alcohol use. ?Tobacco use. ?Drug use. ?Emotional well-being. ?Home and relationship well-being. ?Sexual activity. ?Eating  habits. ?History of falls. ?Memory and ability to understand (cognition). ?Work and work Statistician. ?Reproductive health. ?Screening  ?You may have the following tests or measurements: ?Height, weight, and BMI. ?Blood pressure. ?Lipid and cholesterol levels. These may be checked every 5 years, or more frequently if you are over 33 years old. ?Skin check. ?Lung cancer screening. You may have this screening every year starting at age 37 if you have a 30-pack-year history of smoking and currently smoke or have quit within the past 15 years. ?Fecal occult blood test (FOBT) of the stool. You may have this test every year starting at age 52. ?Flexible sigmoidoscopy or colonoscopy. You may have a sigmoidoscopy every 5 years or a colonoscopy every 10 years starting at age 65. ?Hepatitis C blood test. ?Hepatitis B blood test. ?Sexually transmitted disease (STD) testing. ?Diabetes screening. This is done by checking your blood sugar (glucose) after you have not eaten for a while (fasting). You may have this done every 1-3 years. ?Bone density scan. This is done to screen for osteoporosis. You may have this done starting at age 5. ?Mammogram. This may be done every 1-2 years. Talk to your health care provider about how often you should have regular mammograms. ?Talk with your health care provider about your test results, treatment options, and if necessary, the need for more tests. ?Vaccines  ?Your health care provider may recommend certain vaccines, such as: ?Influenza vaccine. This is recommended every year. ?Tetanus, diphtheria, and acellular pertussis (Tdap, Td) vaccine. You may need a Td booster every 10 years. ?Zoster vaccine. You may need this after age 75. ?Pneumococcal 13-valent conjugate (PCV13) vaccine. One dose is recommended after age 39. ?Pneumococcal polysaccharide (PPSV23) vaccine. One dose is recommended after age 60. ?Talk to your health care provider about which screenings and vaccines  you need and how  often you need them. ?This information is not intended to replace advice given to you by your health care provider. Make sure you discuss any questions you have with your health care provider. ?Document Released: 05/06/2015 Document Revised: 12/28/2015 Document Reviewed: 02/08/2015 ?Elsevier Interactive Patient Education ? 2017 [place]. ? ?Fall Prevention in the Home ?Falls can cause injuries. They can happen to people of all ages. There are many things you can do to make your home safe and to help prevent falls. ?What can I do on the outside of my home? ?Regularly fix the edges of walkways and driveways and fix any cracks. ?Remove anything that might make you trip as you walk through a door, such as a raised step or threshold. ?Trim any bushes or trees on the path to your home. ?Use bright outdoor lighting. ?Clear any walking paths of anything that might make someone trip, such as rocks or tools. ?Regularly check to see if handrails are loose or broken. Make sure that both sides of any steps have handrails. ?Any raised decks and porches should have guardrails on the edges. ?Have any leaves, snow, or ice cleared regularly. ?Use sand or salt on walking paths during winter. ?Clean up any spills in your garage right away. This includes oil or grease spills. ?What can I do in the bathroom? ?Use night lights. ?Install grab bars by the toilet and in the tub and shower. Do not use towel bars as grab bars. ?Use non-skid mats or decals in the tub or shower. ?If you need to sit down in the shower, use a plastic, non-slip stool. ?Keep the floor dry. Clean up any water that spills on the floor as soon as it happens. ?Remove soap buildup in the tub or shower regularly. ?Attach bath mats securely with double-sided non-slip rug tape. ?Do not have throw rugs and other things on the floor that can make you trip. ?What can I do in the bedroom? ?Use night lights. ?Make sure that you have a light by your bed that is easy to  reach. ?Do not use any sheets or blankets that are too big for your bed. They should not hang down onto the floor. ?Have a firm chair that has side arms. You can use this for support while you get dressed. ?Do not have throw rugs and other things on the floor that can make you trip. ?What can I do in the kitchen? ?Clean up any spills right away. ?Avoid walking on wet floors. ?Keep items that you use a lot in easy-to-reach places. ?If you need to reach something above you, use a strong step stool that has a grab bar. ?Keep electrical cords out of the way. ?Do not use floor polish or wax that makes floors slippery. If you must use wax, use non-skid floor wax. ?Do not have throw rugs and other things on the floor that can make you trip. ?What can I do with my stairs? ?Do not leave any items on the stairs. ?Make sure that there are handrails on both sides of the stairs and use them. Fix handrails that are broken or loose. Make sure that handrails are as long as the stairways. ?Check any carpeting to make sure that it is firmly attached to the stairs. Fix any carpet that is loose or worn. ?Avoid having throw rugs at the top or bottom of the stairs. If you do have throw rugs, attach them to the floor with carpet tape. ?Make sure  that you have a light switch at the top of the stairs and the bottom of the stairs. If you do not have them, ask someone to add them for you. ?What else can I do to help prevent falls? ?Wear shoes that: ?Do not have high heels. ?Have rubber bottoms. ?Are comfortable and fit you well. ?Are closed at the toe. Do not wear sandals. ?If you use a stepladder: ?Make sure that it is fully opened. Do not climb a closed stepladder. ?Make sure that both sides of the stepladder are locked into place. ?Ask someone to hold it for you, if possible. ?Clearly mark and make sure that you can see: ?Any grab bars or handrails. ?First and last steps. ?Where the edge of each step is. ?Use tools that help you move  around (mobility aids) if they are needed. These include: ?Canes. ?Walkers. ?Scooters. ?Crutches. ?Turn on the lights when you go into a dark area. Replace any light bulbs as soon as they burn out. ?Set up your furniture so you

## 2021-08-09 NOTE — Progress Notes (Signed)
? ?Subjective:  ? Tammy Guerra is a 71 y.o. female who presents for Medicare Annual (Subsequent) preventive examination. ? ?I connected with  Tammy Guerra on 08/09/21 by a telephone enabled telemedicine application and verified that I am speaking with the correct person using two identifiers. ?  ?I discussed the limitations of evaluation and management by telemedicine. The patient expressed understanding and agreed to proceed. ? ?Patient location: home ? ?Provider location:  Tele-Health not in office ? ? ? ?Review of Systems    ? ?Cardiac Risk Factors include: advanced age (>74mn, >>68women);diabetes mellitus;family history of premature cardiovascular disease;obesity (BMI >30kg/m2) ? ?   ?Objective:  ?  ?Today's Vitals  ? 08/09/21 0831  ?PainSc: 3   ? ?There is no height or weight on file to calculate BMI. ? ? ?  08/09/2021  ?  8:37 AM 08/01/2020  ? 10:35 AM 08/20/2019  ?  9:17 AM 05/27/2018  ?  9:54 AM 05/30/2017  ?  6:21 AM 05/23/2017  ?  1:31 PM  ?Advanced Directives  ?Does Patient Have a Medical Advance Directive? No No No No No No  ?Would patient like information on creating a medical advance directive? No - Patient declined  No - Patient declined Yes (MAU/Ambulatory/Procedural Areas - Information given) No - Patient declined   ? ? ?Current Medications (verified) ?Outpatient Encounter Medications as of 08/09/2021  ?Medication Sig  ? ALPRAZolam (XANAX) 0.25 MG tablet Take 1 tablet (0.25 mg total) by mouth 2 (two) times daily as needed for anxiety.  ? cilostazol (PLETAL) 100 MG tablet Take 1 tablet (100 mg total) by mouth 2 (two) times daily.  ? hydrocortisone (ANUSOL-HC) 2.5 % rectal cream Apply topically 2 (two) times daily.  ? lisinopril (ZESTRIL) 2.5 MG tablet Take 1 tablet by mouth once daily  ? meloxicam (MOBIC) 7.5 MG tablet Take 7.5 mg by mouth daily.  ? metFORMIN (GLUCOPHAGE-XR) 500 MG 24 hr tablet TAKE 2 TABLETS BY MOUTH DAILY WITH DINNER.  ? ONETOUCH ULTRA test strip TEST BLOOD SUGAR EVERY DAY  ?  rosuvastatin (CRESTOR) 20 MG tablet Take 1 tablet (20 mg total) by mouth daily.  ? Semaglutide,0.25 or 0.'5MG'$ /DOS, (OZEMPIC, 0.25 OR 0.5 MG/DOSE,) 2 MG/1.5ML SOPN Inject 0.5 mg into the skin once a week. Start with 0.'25MG'$  once a week x 4 weeks, then increase to 0.'5MG'$  weekly.  ? traZODone (DESYREL) 50 MG tablet TAKE 1 TABLET (50 MG TOTAL) BY MOUTH AT BEDTIME AS NEEDED FOR SLEEP (CAN TAKE 1-2 TABS AS NEEDED ).  ? ezetimibe (ZETIA) 10 MG tablet Take 1 tablet (10 mg total) by mouth daily.  ? KETOCONAZOLE, TOPICAL, 1 % SHAM Apply 1 application topically 2 (two) times a week. (Patient not taking: Reported on 08/09/2021)  ? ?No facility-administered encounter medications on file as of 08/09/2021.  ? ? ?Allergies (verified) ?Patient has no known allergies.  ? ?History: ?Past Medical History:  ?Diagnosis Date  ? Anxiety   ? Arthritis   ? Cancer ([facility]   ? skin/CERVICAL CA  ? Complication of anesthesia   ? discomfort during first cataract  ? Diabetes mellitus without complication ([place]   ? Emphysema lung ([place] 07/01/2020  ? GERD (gastroesophageal reflux disease)   ? NO MEDS  ? History of kidney stones   ? STONES AND CYSTS  ? Kidney stone   ? Palpitations   ? Pancreatic cyst   ? ?Past Surgical History:  ?Procedure Laterality Date  ? CARDIAC CATHETERIZATION    ? CATARACT EXTRACTION  W/PHACO Left 09/07/2014  ? Procedure: CATARACT EXTRACTION PHACO AND INTRAOCULAR LENS PLACEMENT (IOC);  Surgeon: Birder Robson, MD;  Location: [facility] ORS;  Service: Ophthalmology;  Laterality: Left;  [place] 01:03 ?AP% 27.7 ?CDE 17.55  ? CORONARY ANGIOPLASTY    ? EYE SURGERY    ? cataract  ? KNEE ARTHROSCOPY WITH MEDIAL MENISECTOMY Right 05/30/2017  ? Procedure: KNEE ARTHROSCOPY WITH MEDIAL AND LATERAL  MENISECTOMY;  Surgeon: Hessie Knows, MD;  Location: [facility] ORS;  Service: Orthopedics;  Laterality: Right;  ? SYNOVECTOMY Right 05/30/2017  ? Procedure: SYNOVECTOMY;  Surgeon: Hessie Knows, MD;  Location: [facility] ORS;  Service: Orthopedics;  Laterality: Right;  ?  TOTAL ABDOMINAL HYSTERECTOMY W/ BILATERAL SALPINGOOPHORECTOMY    ? ?Family History  ?Problem Relation Age of Onset  ? Diabetes Father   ? Alcohol abuse Father   ? Early death Father   ? Heart disease Father   ? Stroke Father   ? Diabetes Brother   ? Alcohol abuse Brother   ? Early death Brother   ? Heart disease Brother   ? Stroke Brother   ? Diabetes Brother   ? Cancer Brother   ? Early death Brother   ? Early death Brother   ? Cancer Paternal Aunt   ?     Breast  ? Breast cancer Neg Hx   ? ?Social History  ? ?Socioeconomic History  ? Marital status: Widowed  ?  Spouse name: Not on file  ? Number of children: Not on file  ? Years of education: Not on file  ? Highest education level: Not on file  ?Occupational History  ? Not on file  ?Tobacco Use  ? Smoking status: Former  ?  Packs/day: 0.50  ?  Years: 41.00  ?  Pack years: 20.50  ?  Types: Cigarettes  ?  Quit date: 12/19/2020  ?  Years since quitting: 0.6  ? Smokeless tobacco: Never  ? Tobacco comments:  ?  15 CIG DAILY  ?Vaping Use  ? Vaping Use: Never used  ?Substance and Sexual Activity  ? Alcohol use: No  ? Drug use: No  ? Sexual activity: Not Currently  ?  Birth control/protection: Surgical  ?Other Topics Concern  ? Not on file  ?Social History Narrative  ? Not on file  ? ?Social Determinants of Health  ? ?Financial Resource Strain: Medium Risk  ? Difficulty of Paying Living Expenses: Somewhat hard  ?Food Insecurity: No Food Insecurity  ? Worried About Charity fundraiser in the Last Year: Never true  ? Ran Out of Food in the Last Year: Never true  ?Transportation Needs: No Transportation Needs  ? Lack of Transportation (Medical): No  ? Lack of Transportation (Non-Medical): No  ?Physical Activity: Sufficiently Active  ? Days of Exercise per Week: 5 days  ? Minutes of Exercise per Session: 40 min  ?Stress: No Stress Concern Present  ? Feeling of Stress : Not at all  ?Social Connections: Moderately Integrated  ? Frequency of Communication with Friends and Family:  More than three times a week  ? Frequency of Social Gatherings with Friends and Family: Once a week  ? Attends Religious Services: More than 4 times per year  ? Active Member of Clubs or Organizations: Yes  ? Attends Archivist Meetings: More than 4 times per year  ? Marital Status: Widowed  ? ? ?Tobacco Counseling ?Counseling given: Not Answered ?Tobacco comments: 15 CIG DAILY ? ? ?Clinical Intake: ? ?Pre-visit preparation completed: Yes ? ?  Pain : 0-10 ?Pain Score: 3  ?Pain Location: Back ?Pain Descriptors / Indicators: Burning, Constant, Aching ?Pain Onset: More than a month ago ?Pain Frequency: Intermittent ?Pain Relieving Factors: meloxicam ? ?Pain Relieving Factors: meloxicam ? ?Nutritional Risks: None ?Diabetes: Yes ?CBG done?: No ?Did pt. bring in CBG monitor from home?: No ? ?How often do you need to have someone help you when you read instructions, pamphlets, or other written materials from your doctor or pharmacy?: 1 - Never ? ?Diabetic?  Yes ? ?Nutrition Risk Assessment: ? ?Has the patient had any N/V/D within the last 2 months?  No  ?Does the patient have any non-healing wounds?  No  ?Has the patient had any unintentional weight loss or weight gain?  No  ? ?Diabetes: ? ?Is the patient diabetic?  Yes  ?If diabetic, was a CBG obtained today?  No  ?Did the patient bring in their glucometer from home?  No  ?How often do you monitor your CBG's? 2 x a day.  ? ?Financial Strains and Diabetes Management: ? ?Are you having any financial strains with the device, your supplies or your medication? Yes .  ?Does the patient want to be seen by Chronic Care Management for management of their diabetes?  No  ?Would the patient like to be referred to a Nutritionist or for Diabetic Management?  No  ? ?Diabetic Exams: ? ?Diabetic Eye Exam: Completed  Pt has been advised about the importance in completing this exam.  ? ?Diabetic Foot Exam:. Pt has been advised about the importance in completing this exam.   ? ?Interpreter Needed?: No ? ?Information entered by :: Leroy Kennedy LPN ? ? ?Activities of Daily Living ? ?  08/09/2021  ?  8:43 AM  ?In your present state of health, do you have any difficulty performing the foll

## 2021-08-11 ENCOUNTER — Other Ambulatory Visit: Payer: Self-pay

## 2021-08-11 ENCOUNTER — Telehealth: Payer: Self-pay | Admitting: Nurse Practitioner

## 2021-08-11 DIAGNOSIS — Z87891 Personal history of nicotine dependence: Secondary | ICD-10-CM

## 2021-08-11 DIAGNOSIS — F17210 Nicotine dependence, cigarettes, uncomplicated: Secondary | ICD-10-CM

## 2021-08-11 DIAGNOSIS — Z122 Encounter for screening for malignant neoplasm of respiratory organs: Secondary | ICD-10-CM

## 2021-08-11 NOTE — Telephone Encounter (Signed)
Patient brought in [employer] patient Assistant program application  to be completed and sent in by provider. Application was placed in the providers folder for completion. ?

## 2021-08-14 NOTE — Telephone Encounter (Signed)
Paperwork has been faxed over to company. Pt has been made aware. ?

## 2021-08-22 NOTE — Progress Notes (Signed)
? ?Cardiology Office Note   ? ?Date:  08/23/2021  ? ?ID:  Tammy Guerra, DOB 24-Dec-1950, MRN [id number] ? ?PCP:  [name], NP  ?Cardiologist:  Kathlyn Sacramento, MD  ?Electrophysiologist:  None  ? ?Chief Complaint: Groin pain, dyspnea, fatigue, chest pain ? ?History of Present Illness:  ? ?Tammy Guerra is a 71 y.o. female with history of coronary artery calcification, aortic atherosclerosis, PAD, pancreatic cyst, DM2, HTN, HLD, and tobacco use who presents for evaluation of bilateral groin pain, dyspnea, fatigue, and chest pain. ? ?She was seen in 2022 for prolonged symptoms of bilateral calf claudication as well as some discomfort in the hips and thighs.  Noninvasive vascular studies showed an ABI of 0.92 on the right and 0.63 on the left.  Duplex showed severe stenosis of the right popliteal artery with chronically occluded left SFA throughout its whole length.  She was started on cilostazol along with recommendation to quit smoking and begin a walking program.  She was last seen in the office on 06/01/2021 and reported she had quit smoking with noted improvement following initiation of cilostazol.  She was without angina or decompensation.  She was planning to get a treadmill for regular exercise.  ? ?She comes in today concerned regarding several issues.  She has noted a several month history of brief, intermittent sharp bilateral groin pain that is randomly occurring.  Pain is pinpoint and does not radiate.  It is not exacerbated when ambulating on her treadmill.  She does intermittently note some left lower extremity discomfort when ambulating.  No nonhealing wounds or ulcers along the lower extremities.  She continues to abstain from smoking.  She is tolerant and adherent to her cardiac medications.  She indicates that she has not been as active as she previously was secondary to underlying fatigue.  At times, she does note some dyspnea and intermittent left-sided chest discomfort.  This discomfort  also appears to be randomly occurring.  Currently chest pain-free. ? ? ?Labs independently reviewed: ?04/2021 - A1c 6.8, TC 128, TG 102, HDL 61, LDL 48, BUN 14, SCr 0.66, potassium 4.5, albumin 4.4, AST/ALT normal ?08/2020 - HGB 15.2, PLT 310 ?03/2018 - TSH normal ? ?Past Medical History:  ?Diagnosis Date  ? Anxiety   ? Arthritis   ? Cancer [facility])   ? skin/CERVICAL CA  ? Complication of anesthesia   ? discomfort during first cataract  ? Diabetes mellitus without complication ([place])   ? Emphysema lung ([place]) 07/01/2020  ? GERD (gastroesophageal reflux disease)   ? NO MEDS  ? History of kidney stones   ? STONES AND CYSTS  ? Kidney stone   ? Palpitations   ? Pancreatic cyst   ? ? ?Past Surgical History:  ?Procedure Laterality Date  ? CARDIAC CATHETERIZATION    ? CATARACT EXTRACTION W/PHACO Left 09/07/2014  ? Procedure: CATARACT EXTRACTION PHACO AND INTRAOCULAR LENS PLACEMENT (IOC);  Surgeon: Birder Robson, MD;  Location: [facility] ORS;  Service: Ophthalmology;  Laterality: Left;  [place] 01:03 ?AP% 27.7 ?CDE 17.55  ? CORONARY ANGIOPLASTY    ? EYE SURGERY    ? cataract  ? KNEE ARTHROSCOPY WITH MEDIAL MENISECTOMY Right 05/30/2017  ? Procedure: KNEE ARTHROSCOPY WITH MEDIAL AND LATERAL  MENISECTOMY;  Surgeon: Hessie Knows, MD;  Location: [facility] ORS;  Service: Orthopedics;  Laterality: Right;  ? SYNOVECTOMY Right 05/30/2017  ? Procedure: SYNOVECTOMY;  Surgeon: Hessie Knows, MD;  Location: [facility] ORS;  Service: Orthopedics;  Laterality: Right;  ? TOTAL ABDOMINAL HYSTERECTOMY W/  BILATERAL SALPINGOOPHORECTOMY    ? ? ?Current Medications: ?Current Meds  ?Medication Sig  ? ALPRAZolam (XANAX) 0.25 MG tablet Take 1 tablet (0.25 mg total) by mouth 2 (two) times daily as needed for anxiety.  ? cilostazol (PLETAL) 100 MG tablet Take 1 tablet (100 mg total) by mouth 2 (two) times daily.  ? ezetimibe (ZETIA) 10 MG tablet Take 1 tablet (10 mg total) by mouth daily.  ? hydrocortisone (ANUSOL-HC) 2.5 % rectal cream Apply topically 2 (two) times daily.   ? KETOCONAZOLE, TOPICAL, 1 % SHAM Apply 1 application topically 2 (two) times a week.  ? lisinopril (ZESTRIL) 2.5 MG tablet Take 1 tablet by mouth once daily  ? meloxicam (MOBIC) 7.5 MG tablet Take 7.5 mg by mouth daily.  ? metFORMIN (GLUCOPHAGE-XR) 500 MG 24 hr tablet TAKE 2 TABLETS BY MOUTH DAILY WITH DINNER.  ? metoprolol tartrate (LOPRESSOR) 100 MG tablet Take one tablet '100mg'$  by mouth 2 hours pior to CTA  ? ONETOUCH ULTRA test strip TEST BLOOD SUGAR EVERY DAY  ? rosuvastatin (CRESTOR) 20 MG tablet Take 1 tablet (20 mg total) by mouth daily.  ? Semaglutide,0.25 or 0.'5MG'$ /DOS, (OZEMPIC, 0.25 OR 0.5 MG/DOSE,) 2 MG/1.5ML SOPN Inject 0.5 mg into the skin once a week. Start with 0.'25MG'$  once a week x 4 weeks, then increase to 0.'5MG'$  weekly.  ? traZODone (DESYREL) 50 MG tablet TAKE 1 TABLET (50 MG TOTAL) BY MOUTH AT BEDTIME AS NEEDED FOR SLEEP (CAN TAKE 1-2 TABS AS NEEDED ).  ? ? ?Allergies:   Patient has no known allergies.  ? ?Social History  ? ?Socioeconomic History  ? Marital status: Widowed  ?  Spouse name: Not on file  ? Number of children: Not on file  ? Years of education: Not on file  ? Highest education level: Not on file  ?Occupational History  ? Not on file  ?Tobacco Use  ? Smoking status: Former  ?  Packs/day: 0.50  ?  Years: 41.00  ?  Pack years: 20.50  ?  Types: Cigarettes  ?  Quit date: 12/19/2020  ?  Years since quitting: 0.6  ? Smokeless tobacco: Never  ? Tobacco comments:  ?  15 CIG DAILY  ?Vaping Use  ? Vaping Use: Never used  ?Substance and Sexual Activity  ? Alcohol use: No  ? Drug use: No  ? Sexual activity: Not Currently  ?  Birth control/protection: Surgical  ?Other Topics Concern  ? Not on file  ?Social History Narrative  ? Not on file  ? ?Social Determinants of Health  ? ?Financial Resource Strain: Medium Risk  ? Difficulty of Paying Living Expenses: Somewhat hard  ?Food Insecurity: No Food Insecurity  ? Worried About Charity fundraiser in the Last Year: Never true  ? Ran Out of Food in the  Last Year: Never true  ?Transportation Needs: No Transportation Needs  ? Lack of Transportation (Medical): No  ? Lack of Transportation (Non-Medical): No  ?Physical Activity: Sufficiently Active  ? Days of Exercise per Week: 5 days  ? Minutes of Exercise per Session: 40 min  ?Stress: No Stress Concern Present  ? Feeling of Stress : Not at all  ?Social Connections: Moderately Integrated  ? Frequency of Communication with Friends and Family: More than three times a week  ? Frequency of Social Gatherings with Friends and Family: Once a week  ? Attends Religious Services: More than 4 times per year  ? Active Member of Clubs or Organizations: Yes  ?  Attends Archivist Meetings: More than 4 times per year  ? Marital Status: Widowed  ?  ? ?Family History:  ?The patient's family history includes Alcohol abuse in her brother and father; Cancer in her brother and paternal aunt; Diabetes in her brother, brother, and father; Early death in her brother, brother, brother, and father; Heart disease in her brother and father; Stroke in her brother and father. There is no history of Breast cancer. ? ?ROS:   ?12 point review of systems is negative unless otherwise noted in the HPI. ? ? ?EKGs/Labs/Other Studies Reviewed:   ? ?Studies reviewed were summarized above. The additional studies were reviewed today: ? ?ABI 09/27/2020: ?ABI/TBIToday's ABIToday's TBIPrevious ABIPrevious TBI  ?+-------+-----------+-----------+------------+------------+  ?Right  0.92       0.7        1.06        0.86          ?+-------+-----------+-----------+------------+------------+  ?Left   0.63       0.67       0.78        0.59          ?+-------+-----------+-----------+------------+------------+  ? ?Bilateral ABIs appear decreased compared to prior study on 10/28/17.  ?   ?Summary:  ?Right: Resting right ankle-brachial index indicates mild right lower  ?extremity arterial disease. The right toe-brachial index is normal.  ? ?Left:  Resting left ankle-brachial index indicates moderate left lower  ?extremity arterial disease. The left toe-brachial index is abnormal.  ?__________ ? ?Arterial ultrasound 09/27/2020: ?Summary:  ?Right: 75-99% stenosis noted in th

## 2021-08-23 ENCOUNTER — Ambulatory Visit: Payer: Medicare HMO | Admitting: Physician Assistant

## 2021-08-23 ENCOUNTER — Encounter: Payer: Self-pay | Admitting: Physician Assistant

## 2021-08-23 ENCOUNTER — Other Ambulatory Visit
Admission: RE | Admit: 2021-08-23 | Discharge: 2021-08-23 | Disposition: A | Discharge status: Home / Self Care | Payer: Medicare HMO | Source: Ambulatory Visit | Attending: Physician Assistant | Admitting: Physician Assistant

## 2021-08-23 VITALS — BP 128/70 | HR 66 | Ht 66.0 in | Wt 194.2 lb

## 2021-08-23 DIAGNOSIS — E785 Hyperlipidemia, unspecified: Secondary | ICD-10-CM

## 2021-08-23 DIAGNOSIS — R072 Precordial pain: Secondary | ICD-10-CM | POA: Diagnosis not present

## 2021-08-23 DIAGNOSIS — I739 Peripheral vascular disease, unspecified: Secondary | ICD-10-CM

## 2021-08-23 DIAGNOSIS — R0609 Other forms of dyspnea: Secondary | ICD-10-CM

## 2021-08-23 DIAGNOSIS — Z87891 Personal history of nicotine dependence: Secondary | ICD-10-CM | POA: Diagnosis not present

## 2021-08-23 DIAGNOSIS — I25118 Atherosclerotic heart disease of native coronary artery with other forms of angina pectoris: Secondary | ICD-10-CM | POA: Diagnosis not present

## 2021-08-23 DIAGNOSIS — I700 Atherosclerosis of aorta: Secondary | ICD-10-CM

## 2021-08-23 LAB — BASIC METABOLIC PANEL
Anion gap: 7 (ref 5–15)
BUN: 12 mg/dL (ref 8–23)
CO2: 23 mmol/L (ref 22–32)
Calcium: 9 mg/dL (ref 8.9–10.3)
Chloride: 106 mmol/L (ref 98–111)
Creatinine, Ser: 0.58 mg/dL (ref 0.44–1.00)
GFR, Estimated: >60 mL/min (ref >60)
Glucose, Bld: 119 mg/dL — ABNORMAL HIGH (ref 70–99)
Potassium: 3.7 mmol/L (ref 3.5–5.1)
Sodium: 136 mmol/L (ref 135–145)

## 2021-08-23 MED ORDER — METOPROLOL TARTRATE 100 MG PO TABS: ORAL_TABLET | ORAL | 0 refills | Status: DC

## 2021-08-23 NOTE — Patient Instructions (Signed)
Medication Instructions:  ?Your physician recommends that you continue on your current medications as directed. Please refer to the Current Medication list given to you today.. ? ?*If you need a refill on your cardiac medications before your next appointment, please call your pharmacy* ? ? ?Lab Work: ? ?BMP today at the medical mall.  ? ?If you have labs (blood work) drawn today and your tests are completely normal, you will receive your results only by: ?MyChart Message (if you have MyChart) OR ?A paper copy in the mail ?If you have any lab test that is abnormal or we need to change your treatment, we will call you to review the results. ? ? ?Testing/Procedures: ? ?Your cardiac CT will be scheduled at: ? ?[address] ?[place], [place] [address] ?[phone] ?Please arrive 15 mins early for check-in and test prep. ? ? ?Please follow these instructions carefully (unless otherwise directed): ? ? ?On the Night Before the Test: ?Be sure to Drink plenty of water. ?Do not consume any caffeinated/decaffeinated beverages or chocolate 12 hours prior to your test. ? ? ?On the Day of the Test: ?Drink plenty of water until 1 hour prior to the test. ?Do not eat any food 4 hours prior to the test. ?You may take your regular medications prior to the test.  ?Take metoprolol (Lopressor) '100mg'$  two hours prior to test. ?FEMALES- please wear underwire-free bra if available, avoid dresses & tight clothing ? ? ? ?     ?After the Test: ?Drink plenty of water. ?After receiving IV contrast, you may experience a mild flushed feeling. This is normal. ?On occasion, you may experience a mild rash up to 24 hours after the test. This is not dangerous. If this occurs, you can take Benadryl 25 mg and increase your fluid intake. ?If you experience trouble breathing, this can be serious. If it is severe call 911 IMMEDIATELY. If it is mild, please call our office. ?If you take any of  these medications: Glipizide/Metformin, Avandament, Glucavance, please do not take 48 hours after completing test unless otherwise instructed. ? ?Please allow 2-4 weeks for scheduling of routine cardiac CTs. Some insurance companies require a pre-authorization which may delay scheduling of this test.  ? ?For non-scheduling related questions, please contact the cardiac imaging nurse navigator should you have any questions/concerns: ?Marchia Bond, Cardiac Imaging Nurse Navigator ?Gordy Clement, Cardiac Imaging Nurse Navigator ?[facility] ?Direct Office Dial: [phone]  ? ?For scheduling needs, including cancellations and rescheduling, please call [place], [phone].   ? ?2. Echocardiogram ? ?Your physician has requested that you have an echocardiogram. Echocardiography is a painless test that uses sound waves to create images of your heart. It provides your doctor with information about the size and shape of your heart and how well your heart?s chambers and valves are working. This procedure takes approximately one hour. There are no restrictions for this procedure. Please note; depending on visual quality an IV may need to be placed.  ? ?3. Your physician has requested that you have an ankle brachial index (ABI). During this test an ultrasound and blood pressure cuff are used to evaluate the arteries that supply the arms and legs with blood. Allow thirty minutes for this exam. There are no restrictions or special instructions. ? ?4. Your physician has requested that you have a lower extremity arterial exercise duplex. During this test, exercise and ultrasound are used to evaluate arterial blood flow in  the legs. Allow one hour for this exam. There are no restrictions or special instructions. ? ? ? ?Follow-Up: ?At [facility], you and your health needs are our priority.  As part of our continuing mission to provide you with exceptional heart care, we have created designated  Provider Care Teams.  These Care Teams include your primary Cardiologist (physician) and Advanced Practice Providers (APPs -  Physician Assistants and Nurse Practitioners) who all work together to provide you with the care you need, when you need it. ? ?We recommend signing up for the patient portal called "MyChart".  Sign up information is provided on this After Visit Summary.  MyChart is used to connect with patients for Virtual Visits (Telemedicine).  Patients are able to view lab/test results, encounter notes, upcoming appointments, etc.  Non-urgent messages can be sent to your provider as well.   ?To learn more about what you can do with MyChart, go to [email or web address].   ? ?Your next appointment:   ?AFTER TESTING ? ?The format for your next appointment:   ?In Person ? ?Provider:   ?You may see Kathlyn Sacramento, MD or one of the following Advanced Practice Providers on your designated Care Team:   ?Murray Hodgkins, NP ?Christell Faith, PA-C ?Cadence Kathlen Mody, PA-C ? ? ?Important Information About Sugar ? ? ? ? ? ? ?

## 2021-08-25 ENCOUNTER — Telehealth (HOSPITAL_COMMUNITY): Payer: Self-pay | Admitting: *Deleted

## 2021-08-25 NOTE — Telephone Encounter (Signed)
Reaching out to patient to offer assistance regarding upcoming cardiac imaging study; pt verbalizes understanding of appt date/time, parking situation and where to check in, pre-test NPO status and medications ordered, and verified current allergies; name and call back number provided for further questions should they arise ? ?Saphyra Hutt RN Navigator Cardiac Imaging ?[facility] ?[phone] office ?[phone] cell ? ?Patient to take 100mg metoprolol tartrate two hours prior to her cardiac CT scan.  ?

## 2021-08-28 ENCOUNTER — Ambulatory Visit
Admission: RE | Admit: 2021-08-28 | Discharge: 2021-08-28 | Disposition: A | Discharge status: Home / Self Care | Payer: Medicare HMO | Source: Ambulatory Visit | Attending: Physician Assistant | Admitting: Physician Assistant

## 2021-08-28 ENCOUNTER — Other Ambulatory Visit: Payer: Self-pay | Admitting: Physician Assistant

## 2021-08-28 DIAGNOSIS — R931 Abnormal findings on diagnostic imaging of heart and coronary circulation: Secondary | ICD-10-CM

## 2021-08-28 DIAGNOSIS — I2510 Atherosclerotic heart disease of native coronary artery without angina pectoris: Secondary | ICD-10-CM | POA: Diagnosis not present

## 2021-08-28 DIAGNOSIS — R072 Precordial pain: Secondary | ICD-10-CM | POA: Diagnosis present

## 2021-08-28 MED ORDER — METOPROLOL TARTRATE 5 MG/5ML IV SOLN
10.0000 mg | Freq: Once | INTRAVENOUS | Status: AC
  Administered 2021-08-28: 10 mg via INTRAVENOUS

## 2021-08-28 MED ORDER — IOHEXOL 350 MG/ML SOLN
88.0000 mL | Freq: Once | INTRAVENOUS | Status: AC | PRN
  Administered 2021-08-28: 88 mL via INTRAVENOUS

## 2021-08-28 MED ORDER — NITROGLYCERIN 0.4 MG SL SUBL
0.8000 mg | SUBLINGUAL_TABLET | Freq: Once | SUBLINGUAL | Status: AC
  Administered 2021-08-28: 0.8 mg via SUBLINGUAL

## 2021-08-28 MED ORDER — METOPROLOL TARTRATE 5 MG/5ML IV SOLN
10.0000 mg | Freq: Once | INTRAVENOUS | Status: AC
Start: 2021-08-28 — End: 2021-08-28
  Administered 2021-08-28: 10 mg via INTRAVENOUS

## 2021-08-28 NOTE — Progress Notes (Signed)
Patient tolerated procedure well. Ambulate w/o difficulty. Denies light headedness or being dizzy. Sitting in chair drinking water provided. Encouraged to drink extra water today and reasoning explained. Verbalized understanding. All questions answered. ABC intact. No further needs. Discharge from procedure area w/o issues.   °

## 2021-08-29 DIAGNOSIS — I2510 Atherosclerotic heart disease of native coronary artery without angina pectoris: Secondary | ICD-10-CM | POA: Diagnosis not present

## 2021-08-29 DIAGNOSIS — R931 Abnormal findings on diagnostic imaging of heart and coronary circulation: Secondary | ICD-10-CM

## 2021-08-29 NOTE — Progress Notes (Signed)
? ?Cardiology Office Note   ? ?Date:  08/30/2021  ? ?ID:  AILINE Guerra, DOB 07-01-1950, MRN [id number] ? ?PCP:  [name], NP  ?Cardiologist:  Kathlyn Sacramento, MD  ?Electrophysiologist:  None  ? ?Chief Complaint: Follow-up ? ?History of Present Illness:  ? ?Tammy Guerra is a 71 y.o. female with history of coronary artery calcification, aortic atherosclerosis, PAD, pancreatic cyst, DM2, HTN, HLD, and tobacco use who presents for follow-up of coronary CTA. ?  ?She was seen in 2022 for prolonged symptoms of bilateral calf claudication as well as some discomfort in the hips and thighs.  Noninvasive vascular studies showed an ABI of 0.92 on the right and 0.63 on the left.  Duplex showed severe stenosis of the right popliteal artery with chronically occluded left SFA throughout its whole length.  She was started on cilostazol along with recommendation to quit smoking and begin a walking program.  She was seen in the office on 06/01/2021 and reported she had quit smoking with noted improvement following initiation of cilostazol.  She was without angina or decompensation.  She was planning to get a treadmill for regular exercise.  ? ?She was last seen in the office on 08/23/2021 noting several issues including a several month history of brief, intermittent sharp bilateral groin pain that was randomly occurring, pinpoint, and without radiation.  It was not exacerbated when ambulating on her treadmill, though did note discomfort in the left lower extremity with ambulation otherwise.  She was without nonhealing wounds or ulcers.  Noninvasive lower extremity imaging was recommended and is pending at this time.  She also noted intermittent left-sided chest discomfort and dyspnea.  Given symptoms, she underwent coronary CTA on 08/28/2021, which demonstrated a calcium score of 756 which was the 96 percentile.  The LAD had 50 to 69% proximal and mid stenosis, LCx with proximal 25 to 49% stenosis, and less than 25% stenosis  involving the proximal and distal segments of the RCA.  FFR was positive at 0.78 in the mid LAD.  She comes in today to discuss further management. ? ?She comes in today noting an episode of chest discomfort yesterday while on the way to get her coronary CTA.  Pain resolved upon laying down for imaging.  Otherwise, she has continued to have intermittent left-sided chest discomfort and dyspnea consistent with what she noted at her last visit.  No presyncope or syncope.  No change in lower extremity symptoms. ? ? ?Labs independently reviewed: ?08/2021 - potassium 3.7, BUN 12, serum creatinine 0.58 ?04/2021 - A1c 6.8, TC 128, TG 102, HDL 61, LDL 48, albumin 4.4, AST/ALT normal ?08/2020 - HGB 15.2, PLT 310 ?03/2018 - TSH normal ? ?Past Medical History:  ?Diagnosis Date  ? Anxiety   ? Arthritis   ? Cancer [facility])   ? skin/CERVICAL CA  ? Complication of anesthesia   ? discomfort during first cataract  ? Diabetes mellitus without complication ([place])   ? Emphysema lung ([place]) 07/01/2020  ? GERD (gastroesophageal reflux disease)   ? NO MEDS  ? History of kidney stones   ? STONES AND CYSTS  ? Kidney stone   ? Palpitations   ? Pancreatic cyst   ? ? ?Past Surgical History:  ?Procedure Laterality Date  ? CARDIAC CATHETERIZATION    ? CATARACT EXTRACTION W/PHACO Left 09/07/2014  ? Procedure: CATARACT EXTRACTION PHACO AND INTRAOCULAR LENS PLACEMENT (IOC);  Surgeon: Birder Robson, MD;  Location: [facility] ORS;  Service: Ophthalmology;  Laterality: Left;  [place] 01:03 ?  AP% 27.7 ?CDE 17.55  ? CORONARY ANGIOPLASTY    ? EYE SURGERY    ? cataract  ? KNEE ARTHROSCOPY WITH MEDIAL MENISECTOMY Right 05/30/2017  ? Procedure: KNEE ARTHROSCOPY WITH MEDIAL AND LATERAL  MENISECTOMY;  Surgeon: Hessie Knows, MD;  Location: [facility] ORS;  Service: Orthopedics;  Laterality: Right;  ? SYNOVECTOMY Right 05/30/2017  ? Procedure: SYNOVECTOMY;  Surgeon: Hessie Knows, MD;  Location: [facility] ORS;  Service: Orthopedics;  Laterality: Right;  ? TOTAL ABDOMINAL HYSTERECTOMY W/  BILATERAL SALPINGOOPHORECTOMY    ? ? ?Current Medications: ?Current Meds  ?Medication Sig  ? ALPRAZolam (XANAX) 0.25 MG tablet Take 1 tablet (0.25 mg total) by mouth 2 (two) times daily as needed for anxiety. (Patient taking differently: Take 0.25 mg by mouth as needed for anxiety. Only gets 30 tablets every 3 months.)  ? cilostazol (PLETAL) 100 MG tablet Take 1 tablet (100 mg total) by mouth 2 (two) times daily.  ? ezetimibe (ZETIA) 10 MG tablet Take 1 tablet (10 mg total) by mouth daily.  ? hydrocortisone (ANUSOL-HC) 2.5 % rectal cream Apply topically 2 (two) times daily.  ? KETOCONAZOLE, TOPICAL, 1 % SHAM Apply 1 application topically 2 (two) times a week.  ? lisinopril (ZESTRIL) 2.5 MG tablet Take 1 tablet by mouth once daily  ? meloxicam (MOBIC) 7.5 MG tablet Take 7.5 mg by mouth daily.  ? metFORMIN (GLUCOPHAGE-XR) 500 MG 24 hr tablet TAKE 2 TABLETS BY MOUTH DAILY WITH DINNER.  ? ONETOUCH ULTRA test strip TEST BLOOD SUGAR EVERY DAY  ? rosuvastatin (CRESTOR) 20 MG tablet Take 1 tablet (20 mg total) by mouth daily.  ? Semaglutide,0.25 or 0.'5MG'$ /DOS, (OZEMPIC, 0.25 OR 0.5 MG/DOSE,) 2 MG/1.5ML SOPN Inject 0.5 mg into the skin once a week. Start with 0.'25MG'$  once a week x 4 weeks, then increase to 0.'5MG'$  weekly.  ? traZODone (DESYREL) 50 MG tablet TAKE 1 TABLET (50 MG TOTAL) BY MOUTH AT BEDTIME AS NEEDED FOR SLEEP (CAN TAKE 1-2 TABS AS NEEDED ).  ? ? ?Allergies:   Patient has no known allergies.  ? ?Social History  ? ?Socioeconomic History  ? Marital status: Widowed  ?  Spouse name: Not on file  ? Number of children: Not on file  ? Years of education: Not on file  ? Highest education level: Not on file  ?Occupational History  ? Not on file  ?Tobacco Use  ? Smoking status: Former  ?  Packs/day: 0.50  ?  Years: 41.00  ?  Pack years: 20.50  ?  Types: Cigarettes  ?  Quit date: 12/19/2020  ?  Years since quitting: 0.6  ? Smokeless tobacco: Never  ? Tobacco comments:  ?  15 CIG DAILY  ?Vaping Use  ? Vaping Use: Never used   ?Substance and Sexual Activity  ? Alcohol use: No  ? Drug use: No  ? Sexual activity: Not Currently  ?  Birth control/protection: Surgical  ?Other Topics Concern  ? Not on file  ?Social History Narrative  ? Not on file  ? ?Social Determinants of Health  ? ?Financial Resource Strain: Medium Risk  ? Difficulty of Paying Living Expenses: Somewhat hard  ?Food Insecurity: No Food Insecurity  ? Worried About Charity fundraiser in the Last Year: Never true  ? Ran Out of Food in the Last Year: Never true  ?Transportation Needs: No Transportation Needs  ? Lack of Transportation (Medical): No  ? Lack of Transportation (Non-Medical): No  ?Physical Activity: Sufficiently Active  ? Days of  Exercise per Week: 5 days  ? Minutes of Exercise per Session: 40 min  ?Stress: No Stress Concern Present  ? Feeling of Stress : Not at all  ?Social Connections: Moderately Integrated  ? Frequency of Communication with Friends and Family: More than three times a week  ? Frequency of Social Gatherings with Friends and Family: Once a week  ? Attends Religious Services: More than 4 times per year  ? Active Member of Clubs or Organizations: Yes  ? Attends Archivist Meetings: More than 4 times per year  ? Marital Status: Widowed  ?  ? ?Family History:  ?The patient's family history includes Alcohol abuse in her brother and father; Cancer in her brother and paternal aunt; Diabetes in her brother, brother, and father; Early death in her brother, brother, brother, and father; Heart disease in her brother and father; Stroke in her brother and father. There is no history of Breast cancer. ? ?ROS:   ?12-point review of systems is negative unless otherwise noted in the HPI. ? ? ?EKGs/Labs/Other Studies Reviewed:   ? ?Studies reviewed were summarized above. The additional studies were reviewed today: ? ?Coronary CTA with CT FFR 08/28/2021: ?FINDINGS: ?Aorta: Normal size. Aortic root and descending aorta calcifications. ?No dissection. ?  ?Aortic  Valve:  Trileaflet.  No calcifications. ?  ?Coronary Arteries:  Normal coronary origin.  Right dominance. ?  ?RCA is a large dominant artery that gives rise to PDA and PLA. There ?is calcified plaque in the

## 2021-08-29 NOTE — H&P (View-Only) (Signed)
Cardiology Office Note    Date:  08/30/2021   ID:  Tammy Guerra, DOB 11-03-1950, MRN [id number]  PCP:  [name], NP  Cardiologist:  Kathlyn Sacramento, MD  Electrophysiologist:  None   Chief Complaint: Follow-up  History of Present Illness:   Tammy Guerra is a 71 y.o. female with history of coronary artery calcification, aortic atherosclerosis, PAD, pancreatic cyst, DM2, HTN, HLD, and tobacco use who presents for follow-up of coronary CTA.   She was seen in 2022 for prolonged symptoms of bilateral calf claudication as well as some discomfort in the hips and thighs.  Noninvasive vascular studies showed an ABI of 0.92 on the right and 0.63 on the left.  Duplex showed severe stenosis of the right popliteal artery with chronically occluded left SFA throughout its whole length.  She was started on cilostazol along with recommendation to quit smoking and begin a walking program.  She was seen in the office on 06/01/2021 and reported she had quit smoking with noted improvement following initiation of cilostazol.  She was without angina or decompensation.  She was planning to get a treadmill for regular exercise.   She was last seen in the office on 08/23/2021 noting several issues including a several month history of brief, intermittent sharp bilateral groin pain that was randomly occurring, pinpoint, and without radiation.  It was not exacerbated when ambulating on her treadmill, though did note discomfort in the left lower extremity with ambulation otherwise.  She was without nonhealing wounds or ulcers.  Noninvasive lower extremity imaging was recommended and is pending at this time.  She also noted intermittent left-sided chest discomfort and dyspnea.  Given symptoms, she underwent coronary CTA on 08/28/2021, which demonstrated a calcium score of 756 which was the 96 percentile.  The LAD had 50 to 69% proximal and mid stenosis, LCx with proximal 25 to 49% stenosis, and less than 25% stenosis  involving the proximal and distal segments of the RCA.  FFR was positive at 0.78 in the mid LAD.  She comes in today to discuss further management.  She comes in today noting an episode of chest discomfort yesterday while on the way to get her coronary CTA.  Pain resolved upon laying down for imaging.  Otherwise, she has continued to have intermittent left-sided chest discomfort and dyspnea consistent with what she noted at her last visit.  No presyncope or syncope.  No change in lower extremity symptoms.   Labs independently reviewed: 08/2021 - potassium 3.7, BUN 12, serum creatinine 0.58 04/2021 - A1c 6.8, TC 128, TG 102, HDL 61, LDL 48, albumin 4.4, AST/ALT normal 08/2020 - HGB 15.2, PLT 310 03/2018 - TSH normal  Past Medical History:  Diagnosis Date   Anxiety    Arthritis    Cancer ([place])    skin/CERVICAL CA   Complication of anesthesia    discomfort during first cataract   Diabetes mellitus without complication ([facility])    Emphysema lung ([place]) 07/01/2020   GERD (gastroesophageal reflux disease)    NO MEDS   History of kidney stones    STONES AND CYSTS   Kidney stone    Palpitations    Pancreatic cyst     Past Surgical History:  Procedure Laterality Date   CARDIAC CATHETERIZATION     CATARACT EXTRACTION W/PHACO Left 09/07/2014   Procedure: CATARACT EXTRACTION PHACO AND INTRAOCULAR LENS PLACEMENT ([place]);  Surgeon: Birder Robson, MD;  Location: [facility] ORS;  Service: Ophthalmology;  Laterality: Left;  [place] 01:03  AP% 27.7 CDE 17.55   CORONARY ANGIOPLASTY     EYE SURGERY     cataract   KNEE ARTHROSCOPY WITH MEDIAL MENISECTOMY Right 05/30/2017   Procedure: KNEE ARTHROSCOPY WITH MEDIAL AND LATERAL  MENISECTOMY;  Surgeon: Hessie Knows, MD;  Location: [facility] ORS;  Service: Orthopedics;  Laterality: Right;   SYNOVECTOMY Right 05/30/2017   Procedure: SYNOVECTOMY;  Surgeon: Hessie Knows, MD;  Location: [facility] ORS;  Service: Orthopedics;  Laterality: Right;   TOTAL ABDOMINAL HYSTERECTOMY W/  BILATERAL SALPINGOOPHORECTOMY      Current Medications: Current Meds  Medication Sig   ALPRAZolam (XANAX) 0.25 MG tablet Take 1 tablet (0.25 mg total) by mouth 2 (two) times daily as needed for anxiety. (Patient taking differently: Take 0.25 mg by mouth as needed for anxiety. Only gets 30 tablets every 3 months.)   cilostazol (PLETAL) 100 MG tablet Take 1 tablet (100 mg total) by mouth 2 (two) times daily.   ezetimibe (ZETIA) 10 MG tablet Take 1 tablet (10 mg total) by mouth daily.   hydrocortisone (ANUSOL-HC) 2.5 % rectal cream Apply topically 2 (two) times daily.   KETOCONAZOLE, TOPICAL, 1 % SHAM Apply 1 application topically 2 (two) times a week.   lisinopril (ZESTRIL) 2.5 MG tablet Take 1 tablet by mouth once daily   meloxicam (MOBIC) 7.5 MG tablet Take 7.5 mg by mouth daily.   metFORMIN (GLUCOPHAGE-XR) 500 MG 24 hr tablet TAKE 2 TABLETS BY MOUTH DAILY WITH DINNER.   ONETOUCH ULTRA test strip TEST BLOOD SUGAR EVERY DAY   rosuvastatin (CRESTOR) 20 MG tablet Take 1 tablet (20 mg total) by mouth daily.   Semaglutide,0.25 or 0.'5MG'$ /DOS, (OZEMPIC, 0.25 OR 0.5 MG/DOSE,) 2 MG/1.5ML SOPN Inject 0.5 mg into the skin once a week. Start with 0.'25MG'$  once a week x 4 weeks, then increase to 0.'5MG'$  weekly.   traZODone (DESYREL) 50 MG tablet TAKE 1 TABLET (50 MG TOTAL) BY MOUTH AT BEDTIME AS NEEDED FOR SLEEP (CAN TAKE 1-2 TABS AS NEEDED ).    Allergies:   Patient has no known allergies.   Social History   Socioeconomic History   Marital status: Widowed    Spouse name: Not on file   Number of children: Not on file   Years of education: Not on file   Highest education level: Not on file  Occupational History   Not on file  Tobacco Use   Smoking status: Former    Packs/day: 0.50    Years: 41.00    Pack years: 20.50    Types: Cigarettes    Quit date: 12/19/2020    Years since quitting: 0.6   Smokeless tobacco: Never   Tobacco comments:    15 CIG DAILY  Vaping Use   Vaping Use: Never used   Substance and Sexual Activity   Alcohol use: No   Drug use: No   Sexual activity: Not Currently    Birth control/protection: Surgical  Other Topics Concern   Not on file  Social History Narrative   Not on file   Social Determinants of Health   Financial Resource Strain: Medium Risk   Difficulty of Paying Living Expenses: Somewhat hard  Food Insecurity: No Food Insecurity   Worried About Charity fundraiser in the Last Year: Never true   [place] in the Last Year: Never true  Transportation Needs: No Transportation Needs   Lack of Transportation (Medical): No   Lack of Transportation (Non-Medical): No  Physical Activity: Sufficiently Active   Days of  Exercise per Week: 5 days   Minutes of Exercise per Session: 40 min  Stress: No Stress Concern Present   Feeling of Stress : Not at all  Social Connections: Moderately Integrated   Frequency of Communication with Friends and Family: More than three times a week   Frequency of Social Gatherings with Friends and Family: Once a week   Attends Religious Services: More than 4 times per year   Active Member of [employer] or Organizations: Yes   Attends Archivist Meetings: More than 4 times per year   Marital Status: Widowed     Family History:  The patient's family history includes Alcohol abuse in her brother and father; Cancer in her brother and paternal aunt; Diabetes in her brother, brother, and father; Early death in her brother, brother, brother, and father; Heart disease in her brother and father; Stroke in her brother and father. There is no history of Breast cancer.  ROS:   12-point review of systems is negative unless otherwise noted in the HPI.   EKGs/Labs/Other Studies Reviewed:    Studies reviewed were summarized above. The additional studies were reviewed today:  Coronary CTA with CT FFR 08/28/2021: FINDINGS: Aorta: Normal size. Aortic root and descending aorta calcifications. No dissection.   Aortic  Valve:  Trileaflet.  No calcifications.   Coronary Arteries:  Normal coronary origin.  Right dominance.   RCA is a large dominant artery that gives rise to PDA and PLA. There is calcified plaque in the proximal and distal segments causing mild (<25%) stenosis.   Left main is a large artery that gives rise to LAD and LCX arteries. There is no LM disease.   LAD has calcified plaque in the proximal and mid segments causing moderate stenosis (50-69%).   LCX is a non-dominant artery that gives rise to one two branches. There is calcified plaque in the proximal LCx causing mild stenosis (25-49%).   Other findings:   Normal pulmonary vein drainage into the left atrium.   Normal left atrial appendage without a thrombus.   Normal size of the pulmonary artery.   IMPRESSION: 1. Coronary calcium score of 756. This was 96th percentile for age and sex matched control. 2.  Normal coronary origin with right dominance. 3. Calcified plaque causing moderate proximal to mid LAD stenosis (50-69%). 4.  Mild LCx stenosis (25%), minimal RCA stenosis (<25%). 5. CAD-RADS 3. Moderate stenosis. Consider symptom-guided anti-ischemic pharmacotherapy as well as risk factor modification per guideline directed care. 6. Additional analysis with CT FFR will be submitted and reported separately.   CT FFR -  1. Left Main:  No significant stenosis. 2. LAD: significant mid LAD stenosis.  FFRct 0.78 3. LCX: No significant stenosis.  FFRct 0.95 4. RCA: No significant stenosis.  FFRct 0.92   IMPRESSION: 1. CT FFR analysis showed significant stenosis in the mid-distal LAD, FFRct 0.78. 2.  Recommend antianginal therapy. 3. Recommend cardiac cath if symptoms persist despite medical therapy. __________  ABI 09/27/2020: ABI/TBIToday's ABIToday's TBIPrevious ABIPrevious TBI  +-------+-----------+-----------+------------+------------+  Right  0.92       0.7        1.06        0.86           +-------+-----------+-----------+------------+------------+  Left   0.63       0.67       0.78        0.59          +-------+-----------+-----------+------------+------------+   Bilateral ABIs appear decreased  compared to prior study on 10/28/17.     Summary:  Right: Resting right ankle-brachial index indicates mild right lower  extremity arterial disease. The right toe-brachial index is normal.   Left: Resting left ankle-brachial index indicates moderate left lower  extremity arterial disease. The left toe-brachial index is abnormal.  __________   Arterial ultrasound 09/27/2020: Summary:  Right: 75-99% stenosis noted in the superficial femoral artery and/or  popliteal artery.   Left: Total occlusion noted in the superficial femoral artery.     EKG:  EKG is not ordered today.  Recent Labs: 05/10/2021: ALT 22 08/23/2021: BUN 12; Creatinine, Ser 0.58; Potassium 3.7; Sodium 136  Recent Lipid Panel    Component Value Date/Time   CHOL 128 05/10/2021 0920   TRIG 102 05/10/2021 0920   HDL 61 05/10/2021 0920   CHOLHDL 2.1 05/10/2021 0920   LDLCALC 48 05/10/2021 0920    PHYSICAL EXAM:    VS:  BP 126/62 (BP Location: Left Arm, Patient Position: Sitting, Cuff Size: Normal)   Pulse 84   Ht '5\' 6"'$  (1.676 m)   Wt 194 lb (88 kg)   SpO2 98%   BMI 31.31 kg/m   BMI: Body mass index is 31.31 kg/m.  Physical Exam Vitals reviewed.  Constitutional:      Appearance: She is well-developed.  HENT:     Head: Normocephalic and atraumatic.  Eyes:     General:        Right eye: No discharge.        Left eye: No discharge.  Cardiovascular:     Rate and Rhythm: Normal rate and regular rhythm.     Pulses:          Posterior tibial pulses are 1+ on the right side and 1+ on the left side.     Heart sounds: S1 normal and S2 normal. Heart sounds not distant. No midsystolic click and no opening snap. Murmur heard.  Systolic murmur is present with a grade of 1/6 at the upper right sternal  border.    No friction rub.  Pulmonary:     Effort: Pulmonary effort is normal. No respiratory distress.     Breath sounds: Normal breath sounds. No decreased breath sounds, wheezing or rales.  Chest:     Chest wall: No tenderness.  Abdominal:     General: There is no distension.     Palpations: Abdomen is soft.  Musculoskeletal:     Cervical back: Normal range of motion.     Right lower leg: No edema.     Left lower leg: No edema.  Skin:    General: Skin is warm and dry.     Nails: There is no clubbing.  Neurological:     Mental Status: She is alert and oriented to person, place, and time.  Psychiatric:        Speech: Speech normal.        Behavior: Behavior normal.        Thought Content: Thought content normal.        Judgment: Judgment normal.    Wt Readings from Last 3 Encounters:  08/30/21 194 lb (88 kg)  08/23/21 194 lb 4 oz (88.1 kg)  06/01/21 192 lb (87.1 kg)     ASSESSMENT & PLAN:   CAD involving the native coronary arteries with accelerating angina/aortic atherosclerosis/HLD/abnormal coronary CTA: Currently chest pain-free.  She continues to note randomly occurring intermittent left-sided chest discomfort, though also had an episode that was longer lasting and  more pronounced yesterday.  Coronary CTA with notable positive lesion by ctFFR in the mid LAD.  Given escalation in symptoms, we will proceed with diagnostic LHC.  ASA and rosuvastatin.  LDL 48 in 04/2021.    PAD with claudication: Symptoms stable.  ABIs and lower extremity arterial duplex remain pending.  She remains on cilostazol and rosuvastatin.  Continue walking regimen and abstinence from smoking.  HTN: Blood pressure remains well controlled.  She remains on low-dose lisinopril.  Tobacco use: She continues to abstain.   Shared Decision Making/Informed Consent{  The risks [stroke (1 in 1000), death (1 in 1000), kidney failure [usually temporary] (1 in 500), bleeding (1 in 200), allergic reaction  [possibly serious] (1 in 200)], benefits (diagnostic support and management of coronary artery disease) and alternatives of a cardiac catheterization were discussed in detail with Ms. Gillie and she is willing to proceed.     Disposition: F/u with Dr. Fletcher Anon or an APP 1-2 weeks post LHC.   Medication Adjustments/Labs and Tests Ordered: Current medicines are reviewed at length with the patient today.  Concerns regarding medicines are outlined above. Medication changes, Labs and Tests ordered today are summarized above and listed in the Patient Instructions accessible in Encounters.   Signed, Christell Faith, PA-C 08/30/2021 12:02 PM     [address] [place], [place] [address] [phone]

## 2021-08-30 ENCOUNTER — Telehealth: Payer: Self-pay

## 2021-08-30 ENCOUNTER — Ambulatory Visit: Payer: Medicare HMO | Admitting: Physician Assistant

## 2021-08-30 ENCOUNTER — Encounter: Payer: Self-pay | Admitting: Physician Assistant

## 2021-08-30 ENCOUNTER — Other Ambulatory Visit
Admission: RE | Admit: 2021-08-30 | Discharge: 2021-08-30 | Disposition: A | Discharge status: Home / Self Care | Payer: Medicare HMO | Source: Ambulatory Visit | Attending: Physician Assistant | Admitting: Physician Assistant

## 2021-08-30 VITALS — BP 126/62 | HR 84 | Ht 66.0 in | Wt 194.0 lb

## 2021-08-30 DIAGNOSIS — Z01812 Encounter for preprocedural laboratory examination: Secondary | ICD-10-CM | POA: Diagnosis not present

## 2021-08-30 DIAGNOSIS — E785 Hyperlipidemia, unspecified: Secondary | ICD-10-CM | POA: Diagnosis not present

## 2021-08-30 DIAGNOSIS — I200 Unstable angina: Secondary | ICD-10-CM

## 2021-08-30 DIAGNOSIS — I25118 Atherosclerotic heart disease of native coronary artery with other forms of angina pectoris: Secondary | ICD-10-CM | POA: Diagnosis not present

## 2021-08-30 DIAGNOSIS — I700 Atherosclerosis of aorta: Secondary | ICD-10-CM | POA: Diagnosis not present

## 2021-08-30 DIAGNOSIS — I739 Peripheral vascular disease, unspecified: Secondary | ICD-10-CM | POA: Diagnosis not present

## 2021-08-30 DIAGNOSIS — Z87891 Personal history of nicotine dependence: Secondary | ICD-10-CM

## 2021-08-30 LAB — CBC
HCT: 39.4 % (ref 36.0–46.0)
Hemoglobin: 13.3 g/dL (ref 12.0–15.0)
MCH: 30.3 pg (ref 26.0–34.0)
MCHC: 33.8 g/dL (ref 30.0–36.0)
MCV: 89.7 fL (ref 80.0–100.0)
Platelets: 317 10*3/uL (ref 150–400)
RBC: 4.39 MIL/uL (ref 3.87–5.11)
RDW: 12.7 % (ref 11.5–15.5)
WBC: 7.6 10*3/uL (ref 4.0–10.5)
nRBC: 0 % (ref 0.0–0.2)

## 2021-08-30 LAB — BASIC METABOLIC PANEL
Anion gap: 9 (ref 5–15)
BUN: 20 mg/dL (ref 8–23)
CO2: 23 mmol/L (ref 22–32)
Calcium: 9.4 mg/dL (ref 8.9–10.3)
Chloride: 104 mmol/L (ref 98–111)
Creatinine, Ser: 0.57 mg/dL (ref 0.44–1.00)
GFR, Estimated: >60 mL/min (ref >60)
Glucose, Bld: 177 mg/dL — ABNORMAL HIGH (ref 70–99)
Potassium: 4.1 mmol/L (ref 3.5–5.1)
Sodium: 136 mmol/L (ref 135–145)

## 2021-08-30 NOTE — Progress Notes (Signed)
Called and spoke with patient regarding her procedure time change with new arrival time of 08:30 am. She verbalized understanding with no further questions at this time.  ?

## 2021-08-30 NOTE — Chronic Care Management (AMB) (Signed)
Chronic Care Management Pharmacy Assistant   Name: Tammy Guerra  MRN: [id number] DOB: [birth year]  Reason for Encounter: Disease State Diabetes Mellitus   Recent office visits:  05/10/21-Tammy Mathis Dad, NP (PCP) General follow up visit. Labs ordered. Follow up in 3 months.  Recent consult visits:  08/30/21-Tammy Guerra (Cardiology) Cardiac follow up visit. Follow up in 2 weeks. 08/23/21-Tammy Guerra (Cardiology) General cardiac follow up. Labs ordered. 06/01/21-Tammy A. Fletcher Anon, MD (Cardiology) General cardiac follow up visit. Follow up in 6 months.  Hospital visits:  None in previous 6 months  Medications: Outpatient Encounter Medications as of 08/30/2021  Medication Sig   ALPRAZolam (XANAX) 0.25 MG tablet Take 1 tablet (0.25 mg total) by mouth 2 (two) times daily as needed for anxiety. (Patient taking differently: Take 0.25 mg by mouth as needed for anxiety. Only gets 30 tablets every 3 months.)   cilostazol (PLETAL) 100 MG tablet Take 1 tablet (100 mg total) by mouth 2 (two) times daily.   ezetimibe (ZETIA) 10 MG tablet Take 1 tablet (10 mg total) by mouth daily.   hydrocortisone (ANUSOL-HC) 2.5 % rectal cream Apply topically 2 (two) times daily.   KETOCONAZOLE, TOPICAL, 1 % SHAM Apply 1 application topically 2 (two) times a week.   lisinopril (ZESTRIL) 2.5 MG tablet Take 1 tablet by mouth once daily   meloxicam (MOBIC) 7.5 MG tablet Take 7.5 mg by mouth daily.   metFORMIN (GLUCOPHAGE-XR) 500 MG 24 hr tablet TAKE 2 TABLETS BY MOUTH DAILY WITH DINNER.   ONETOUCH ULTRA test strip TEST BLOOD SUGAR EVERY DAY   rosuvastatin (CRESTOR) 20 MG tablet Take 1 tablet (20 mg total) by mouth daily.   Semaglutide,0.25 or 0.'5MG'$ /DOS, (OZEMPIC, 0.25 OR 0.5 MG/DOSE,) 2 MG/1.5ML SOPN Inject 0.5 mg into the skin once a week. Start with 0.'25MG'$  once a week x 4 weeks, then increase to 0.'5MG'$  weekly.   traZODone (DESYREL) 50 MG tablet TAKE 1 TABLET (50 MG TOTAL) BY MOUTH AT BEDTIME AS  NEEDED FOR SLEEP (CAN TAKE 1-2 TABS AS NEEDED ).   No facility-administered encounter medications on file as of 08/30/2021.   Current antihyperglycemic regimen:  Metformin 500 mg take 2 tabs once daily Ozempic 0.25 mg inject 0.5 mg once a week  What recent interventions/DTPs have been made to improve glycemic control:  None noted  Have there been any recent hospitalizations or ED visits since last visit with CPP? No  Patient denies hypoglycemic symptoms, including Pale, Sweaty, Shaky, Hungry, Nervous/irritable, and Vision changes  Patient denies hyperglycemic symptoms, including blurry vision, excessive thirst, fatigue, polyuria, and weakness  How often are you checking your blood sugar?        Patient states she has no been checking her blood sugar lately and will start back up on it.  What are your blood sugars ranging?  Fasting: N/a Before meals:N/a  After meals: N/a Bedtime: N/a  During the week, how often does your blood glucose drop below 70? Never  Are you checking your feet daily/regularly?  Patient states she does check her feet daily.   Patient states she has stopped smoking and has gained some weight and will be trying the mediterranean diet. Patient would also like to renew her Ozempic PAP renewal done before it expires in November which I mentioned we will renew close the the renewal date which is in November 2023.  Adherence Review: Is the patient currently on a STATIN medication? Yes Is the patient currently on ACE/ARB medication?  Yes Does the patient have >5 day gap between last estimated fill dates? No   Care Gaps: Hepatitis C Screening:Never done DEXA SCAN:Never done  Star Rating Drugs: Lisinopril 2.5 mg Last filled:07/26/21 90 DS Rosuvastatin 20 mg Last filled:07/18/21 90 DS Metformin 500 mg Last filled:08/08/21 90 DS  Tammy Guerra, [place]

## 2021-08-30 NOTE — Patient Instructions (Addendum)
Medication Instructions:  ?- Your physician recommends that you continue on your current medications as directed. Please refer to the Current Medication list given to you today. ? ?*If you need a refill on your cardiac medications before your next appointment, please call your pharmacy* ? ? ?Lab Work: ?- Your physician recommends that you return for lab work any day prior to 09/07/21: BMP/ CBC ? ?Medical Mall Entrance at [facility] ?1st desk on the right to check in, past the screening table ?Lab hours: Monday- Friday (7:30 am- 5:30 pm) ? ? ?If you have labs (blood work) drawn today and your tests are completely normal, you will receive your results only by: ?MyChart Message (if you have MyChart) OR ?A paper copy in the mail ?If you have any lab test that is abnormal or we need to change your treatment, we will call you to review the results. ? ? ?Testing/Procedures: ? ?1) Cardiac Cath: ?- Your physician has requested that you have a cardiac catheterization. Cardiac catheterization is used to diagnose and/or treat various heart conditions. Doctors may recommend this procedure for a number of different reasons. The most common reason is to evaluate chest pain. Chest pain can be a symptom of coronary artery disease (CAD), and cardiac catheterization can show whether plaque is narrowing or blocking your heart?s arteries. This procedure is also used to evaluate the valves, as well as measure the blood flow and oxygen levels in different parts of your heart.  ? ? ?You are scheduled for a Cardiac Catheterization on Friday, May 19 with Dr. Kathlyn Sacramento. ? ?1. Please arrive at the [place] [facility] at 7:30 AM (This time is one hour before your procedure to ensure your preparation). Free valet parking service is available.  ? ?Special note: Every effort is made to have your procedure done on time. Please understand that emergencies sometimes delay scheduled procedures. ? ?2. Diet: Do not eat solid foods after midnight.  You  may have clear liquids until 5 AM upon the day of the procedure. ? ?3. Labs: as above ? ?4. Medication instructions in preparation for your procedure: ? ? Contrast Allergy: No ? ?HOLD Metformin 48 hours prior to your procedure ? ? ?On the morning of your procedure, take Aspirin and any morning medicines NOT listed above.  You may use sips of water. ? ?5. Plan to go home the same day, you will only stay overnight if medically necessary. ?6. You MUST have a responsible adult to drive you home. ?7. An adult MUST be with you the first 24 hours after you arrive home. ?8. Bring a current list of your medications, and the last time and date medication taken. ?9. Bring ID and current insurance cards. ?10.Please wear clothes that are easy to get on and off and wear slip-on shoes. ? ?Thank you for allowing [place] to care for you! ?  -- [facility] ? ? ? ?Follow-Up: ?At [facility], you and your health needs are our priority.  As part of our continuing mission to provide you with exceptional heart care, we have created designated Provider Care Teams.  These Care Teams include your primary Cardiologist (physician) and Advanced Practice Providers (APPs -  Physician Assistants and Nurse Practitioners) who all work together to provide you with the care you need, when you need it. ? ?We recommend signing up for the patient portal called "MyChart".  Sign up information is provided on this After Visit Summary.  MyChart is used to  connect with patients for Virtual Visits (Telemedicine).  Patients are able to view lab/test results, encounter notes, upcoming appointments, etc.  Non-urgent messages can be sent to your provider as well.   ?To learn more about what you can do with MyChart, go to [email or web address].   ? ?Your next appointment:   ?1-2 weeks (from 09/08/21)  ? ?The format for your next appointment:   ?In Person ? ?Provider:   ?You may see Kathlyn Sacramento, MD or one of the following Advanced  Practice Providers on your designated Care Team:   ? ?Christell Faith, PA-C  ? ? ?Other Instructions ? ?Coronary Angiogram ?A coronary angiogram is an X-ray procedure that is used to examine the arteries in the heart. Contrast dye is injected through a long, thin tube (catheter) into these arteries. Then X-rays are taken to show any blockage in these arteries. ?You may have this procedure if you: ?Are having chest pain, or other symptoms of angina, and you are at risk for heart disease. ?Have an abnormal stress test or test of your heart's electrical activity (electrocardiogram, or ECG). ?Have chest pain and heart failure. ?Are having irregular heart rhythms. ?A coronary angiogram or heart catheterization can show if you have valve disease or a disease of the aorta. This procedure can also be used to check the overall function of your heart muscle. ?Let your health care provider know about: ?Any allergies you have, including allergies to medicines or contrast dye. ?All medicines you are taking, including vitamins, herbs, eye drops, creams, and over-the-counter medicines. ?Any problems you or family members have had with anesthetic medicines. ?Any blood disorders you have. ?Any surgeries you have had. ?Any history of kidney problems or kidney failure. ?Any medical conditions you have. ?Whether you are pregnant or may be pregnant. ?Whether you are breastfeeding. ?What are the risks? ?Generally, this is a safe procedure. However, problems may occur, including: ?Infection. ?Allergic reaction to medicines or dyes that are used. ?Bleeding from the insertion site or other places. ?Damage to nearby structures, such as blood vessels, or damage to kidneys from contrast dye. ?Irregular heart rhythms. ?Stroke (rare). ?Heart attack (rare). ?What happens before the procedure? ?Staying hydrated ?Follow instructions from your health care provider about hydration, which may include: ?Up to 2 hours before the procedure - you may continue  to drink clear liquids, such as water, clear fruit juice, black coffee, and plain tea. ? ?Eating and drinking restrictions ?Follow instructions from your health care provider about eating and drinking, which may include: ?8 hours before the procedure - stop eating heavy meals or foods, such as meat, fried foods, or fatty foods. ?6 hours before the procedure - stop eating light meals or foods, such as toast or cereal. ?6 hours before the procedure - stop drinking milk or drinks that contain milk. ?2 hours before the procedure - stop drinking clear liquids. ?Medicines ?Ask your health care provider about: ?Changing or stopping your regular medicines. This is especially important if you are taking diabetes medicines or blood thinners. ?Taking medicines such as aspirin and ibuprofen. These medicines can thin your blood. Do not take these medicines unless your health care provider tells you to take them. Aspirin may be recommended before coronary angiograms even if you do not normally take it. ?Taking over-the-counter medicines, vitamins, herbs, and supplements. ?General instructions ?Do not use any products that contain nicotine or tobacco for at least 4 weeks before the procedure. These products include cigarettes, e-cigarettes, and chewing tobacco. If you need  help quitting, ask your health care provider. ?You may have an exam or testing. ?Plan to have someone take you home from the hospital or clinic. ?If you will be going home right after the procedure, plan to have someone with you for 24 hours. ?Ask your health care provider: ?How your insertion site will be marked. ?What steps will be taken to help prevent infection. These may include: ?Removing hair at the insertion site. ?Washing skin with a germ-killing soap. ?Taking antibiotic medicine. ?What happens during the procedure? ? ?You will lie on your back on an X-ray table. ?An IV will be inserted into one of your veins. ?Electrodes will be placed on your  chest. ?You will be given one or more of the following: ?A medicine to help you relax (sedative). ?A medicine to numb the catheter insertion area (local anesthetic). ?You will be connected to a continuous ECG mo

## 2021-09-01 ENCOUNTER — Other Ambulatory Visit: Payer: Self-pay

## 2021-09-01 ENCOUNTER — Emergency Department: Payer: Medicare HMO

## 2021-09-01 ENCOUNTER — Emergency Department
Admission: EM | Admit: 2021-09-01 | Discharge: 2021-09-01 | Discharge status: Against Medical Advice | Payer: Medicare HMO | Attending: Emergency Medicine | Admitting: Emergency Medicine

## 2021-09-01 DIAGNOSIS — R12 Heartburn: Secondary | ICD-10-CM | POA: Insufficient documentation

## 2021-09-01 DIAGNOSIS — R079 Chest pain, unspecified: Secondary | ICD-10-CM | POA: Diagnosis not present

## 2021-09-01 DIAGNOSIS — Z5321 Procedure and treatment not carried out due to patient leaving prior to being seen by health care provider: Secondary | ICD-10-CM | POA: Diagnosis not present

## 2021-09-01 DIAGNOSIS — R0789 Other chest pain: Secondary | ICD-10-CM

## 2021-09-01 LAB — BASIC METABOLIC PANEL
Anion gap: 11 (ref 5–15)
BUN: 22 mg/dL (ref 8–23)
CO2: 25 mmol/L (ref 22–32)
Calcium: 10.3 mg/dL (ref 8.9–10.3)
Chloride: 101 mmol/L (ref 98–111)
Creatinine, Ser: 0.88 mg/dL (ref 0.44–1.00)
GFR, Estimated: >60 mL/min (ref >60)
Glucose, Bld: 194 mg/dL — ABNORMAL HIGH (ref 70–99)
Potassium: 3.8 mmol/L (ref 3.5–5.1)
Sodium: 137 mmol/L (ref 135–145)

## 2021-09-01 LAB — CBC
HCT: 41.5 % (ref 36.0–46.0)
Hemoglobin: 13.9 g/dL (ref 12.0–15.0)
MCH: 30.1 pg (ref 26.0–34.0)
MCHC: 33.5 g/dL (ref 30.0–36.0)
MCV: 89.8 fL (ref 80.0–100.0)
Platelets: 348 10*3/uL (ref 150–400)
RBC: 4.62 MIL/uL (ref 3.87–5.11)
RDW: 12.6 % (ref 11.5–15.5)
WBC: 10.1 10*3/uL (ref 4.0–10.5)
nRBC: 0 % (ref 0.0–0.2)

## 2021-09-01 LAB — TROPONIN I (HIGH SENSITIVITY): Troponin I (High Sensitivity): 3 ng/L (ref <18)

## 2021-09-01 MED ORDER — PANTOPRAZOLE SODIUM 40 MG PO TBEC: 40.0000 mg | DELAYED_RELEASE_TABLET | Freq: Every day | ORAL | 1 refills | Status: DC

## 2021-09-01 NOTE — ED Triage Notes (Signed)
Pt here with cp. Pt has a know blockage and is scheduled for a procedure next Friday. Pt states cp is left sided and radiates to her back. Pt also endorsees heartburn. ?

## 2021-09-01 NOTE — ED Notes (Signed)
Pt declines vs at time of dc. Dc ppw provided. Pt provides verbal consent for dc, pt ambulatory off unit on foot alert and oriented x4. ?

## 2021-09-06 NOTE — Progress Notes (Signed)
Patient called into [facility] and wanted information regarding time to arrive on Friday for scheduled procedure.  I obtained verbal consent to open patient's chart.  Let her know to arrive at registration at [facility] at 8:15am and pre-procedure will begin at 8:30am.  She had other questions and I let her know that her MD office should be calling her with details and if she has further questions to call them.  She agreed. ?

## 2021-09-07 ENCOUNTER — Encounter: Payer: Self-pay | Admitting: Nurse Practitioner

## 2021-09-07 MED ORDER — ALPRAZOLAM 0.25 MG PO TABS: 0.2500 mg | ORAL_TABLET | Freq: Two times a day (BID) | ORAL | 0 refills | Status: DC | PRN

## 2021-09-08 ENCOUNTER — Encounter: Payer: Self-pay | Admitting: Cardiovascular Disease

## 2021-09-08 ENCOUNTER — Encounter
Admission: RE | Disposition: A | Discharge status: Home / Self Care | Payer: Self-pay | Source: Home / Self Care | Attending: Cardiovascular Disease

## 2021-09-08 ENCOUNTER — Other Ambulatory Visit: Payer: Self-pay

## 2021-09-08 ENCOUNTER — Ambulatory Visit
Admission: RE | Admit: 2021-09-08 | Discharge: 2021-09-08 | Disposition: A | Discharge status: Home / Self Care | Payer: Medicare HMO | Attending: Cardiovascular Disease | Admitting: Cardiovascular Disease

## 2021-09-08 DIAGNOSIS — Z7985 Long-term (current) use of injectable non-insulin antidiabetic drugs: Secondary | ICD-10-CM | POA: Diagnosis not present

## 2021-09-08 DIAGNOSIS — I25110 Atherosclerotic heart disease of native coronary artery with unstable angina pectoris: Secondary | ICD-10-CM | POA: Diagnosis not present

## 2021-09-08 DIAGNOSIS — I700 Atherosclerosis of aorta: Secondary | ICD-10-CM | POA: Diagnosis not present

## 2021-09-08 DIAGNOSIS — I10 Essential (primary) hypertension: Secondary | ICD-10-CM | POA: Diagnosis not present

## 2021-09-08 DIAGNOSIS — E1151 Type 2 diabetes mellitus with diabetic peripheral angiopathy without gangrene: Secondary | ICD-10-CM | POA: Diagnosis not present

## 2021-09-08 DIAGNOSIS — I25118 Atherosclerotic heart disease of native coronary artery with other forms of angina pectoris: Secondary | ICD-10-CM

## 2021-09-08 DIAGNOSIS — I25119 Atherosclerotic heart disease of native coronary artery with unspecified angina pectoris: Secondary | ICD-10-CM | POA: Diagnosis not present

## 2021-09-08 DIAGNOSIS — Z87891 Personal history of nicotine dependence: Secondary | ICD-10-CM | POA: Diagnosis not present

## 2021-09-08 DIAGNOSIS — I2584 Coronary atherosclerosis due to calcified coronary lesion: Secondary | ICD-10-CM | POA: Diagnosis not present

## 2021-09-08 DIAGNOSIS — Z7984 Long term (current) use of oral hypoglycemic drugs: Secondary | ICD-10-CM | POA: Insufficient documentation

## 2021-09-08 DIAGNOSIS — E785 Hyperlipidemia, unspecified: Secondary | ICD-10-CM | POA: Diagnosis not present

## 2021-09-08 DIAGNOSIS — I200 Unstable angina: Secondary | ICD-10-CM

## 2021-09-08 HISTORY — PX: LEFT HEART CATH AND CORONARY ANGIOGRAPHY: CATH118249

## 2021-09-08 LAB — GLUCOSE, CAPILLARY: Glucose-Capillary: 138 mg/dL — ABNORMAL HIGH (ref 70–99)

## 2021-09-08 SURGERY — LEFT HEART CATH AND CORONARY ANGIOGRAPHY
Anesthesia: Moderate Sedation

## 2021-09-08 MED ORDER — HEPARIN (PORCINE) IN NACL 1000-0.9 UT/500ML-% IV SOLN
INTRAVENOUS | Status: AC
  Filled 2021-09-08: qty 1000

## 2021-09-08 MED ORDER — LIDOCAINE HCL (PF) 1 % IJ SOLN
INTRAMUSCULAR | Status: DC | PRN
  Administered 2021-09-08: 2 mL

## 2021-09-08 MED ORDER — HEPARIN SODIUM (PORCINE) 1000 UNIT/ML IJ SOLN
INTRAMUSCULAR | Status: DC | PRN
  Administered 2021-09-08: 4500 [IU] via INTRAVENOUS

## 2021-09-08 MED ORDER — SODIUM CHLORIDE 0.9% FLUSH: 3.0000 mL | Freq: Two times a day (BID) | INTRAVENOUS | Status: DC

## 2021-09-08 MED ORDER — ONDANSETRON HCL 4 MG/2ML IJ SOLN: 4.0000 mg | Freq: Four times a day (QID) | INTRAMUSCULAR | Status: DC | PRN

## 2021-09-08 MED ORDER — LIDOCAINE HCL 1 % IJ SOLN
INTRAMUSCULAR | Status: AC
  Filled 2021-09-08: qty 20

## 2021-09-08 MED ORDER — ACETAMINOPHEN 325 MG PO TABS: 650.0000 mg | ORAL_TABLET | ORAL | Status: DC | PRN

## 2021-09-08 MED ORDER — HEPARIN SODIUM (PORCINE) 1000 UNIT/ML IJ SOLN
INTRAMUSCULAR | Status: AC
  Filled 2021-09-08: qty 10

## 2021-09-08 MED ORDER — VERAPAMIL HCL 2.5 MG/ML IV SOLN
INTRAVENOUS | Status: DC | PRN
  Administered 2021-09-08: 2.5 mg via INTRA_ARTERIAL

## 2021-09-08 MED ORDER — SODIUM CHLORIDE 0.9% FLUSH: 3.0000 mL | INTRAVENOUS | Status: DC | PRN

## 2021-09-08 MED ORDER — SODIUM CHLORIDE 0.9 % IV SOLN: 250.0000 mL | INTRAVENOUS | Status: DC | PRN

## 2021-09-08 MED ORDER — HEPARIN (PORCINE) IN NACL 1000-0.9 UT/500ML-% IV SOLN
INTRAVENOUS | Status: DC | PRN
  Administered 2021-09-08: 1000 mL

## 2021-09-08 MED ORDER — FENTANYL CITRATE (PF) 100 MCG/2ML IJ SOLN
INTRAMUSCULAR | Status: AC
  Filled 2021-09-08: qty 2

## 2021-09-08 MED ORDER — SODIUM CHLORIDE 0.9 % WEIGHT BASED INFUSION
3.0000 mL/kg/h | INTRAVENOUS | Status: AC
  Administered 2021-09-08: 3 mL/kg/h via INTRAVENOUS

## 2021-09-08 MED ORDER — VERAPAMIL HCL 2.5 MG/ML IV SOLN
INTRAVENOUS | Status: AC
  Filled 2021-09-08: qty 2

## 2021-09-08 MED ORDER — FENTANYL CITRATE (PF) 100 MCG/2ML IJ SOLN
INTRAMUSCULAR | Status: DC | PRN
  Administered 2021-09-08: 25 ug via INTRAVENOUS

## 2021-09-08 MED ORDER — IOHEXOL 300 MG/ML  SOLN
INTRAMUSCULAR | Status: DC | PRN
  Administered 2021-09-08: 50 mL

## 2021-09-08 MED ORDER — SODIUM CHLORIDE 0.9 % WEIGHT BASED INFUSION: 1.0000 mL/kg/h | INTRAVENOUS | Status: DC

## 2021-09-08 MED ORDER — SODIUM CHLORIDE 0.9 % IV SOLN: INTRAVENOUS | Status: DC

## 2021-09-08 MED ORDER — MIDAZOLAM HCL 2 MG/2ML IJ SOLN
INTRAMUSCULAR | Status: AC
  Filled 2021-09-08: qty 2

## 2021-09-08 MED ORDER — ASPIRIN 81 MG PO CHEW: 81.0000 mg | CHEWABLE_TABLET | ORAL | Status: AC

## 2021-09-08 MED ORDER — MIDAZOLAM HCL 2 MG/2ML IJ SOLN
INTRAMUSCULAR | Status: DC | PRN
  Administered 2021-09-08: 1 mg via INTRAVENOUS

## 2021-09-08 MED ORDER — ASPIRIN 81 MG PO CHEW
CHEWABLE_TABLET | ORAL | Status: AC
  Administered 2021-09-08: 81 mg via ORAL
  Filled 2021-09-08: qty 1

## 2021-09-08 SURGICAL SUPPLY — 12 items
CATH 5FR JR4 DIAGNOSTIC (CATHETERS) ×1 IMPLANT
CATH INFINITI 5FR JK (CATHETERS) ×1 IMPLANT
DEVICE RAD TR BAND REGULAR (VASCULAR PRODUCTS) ×1 IMPLANT
DRAPE BRACHIAL (DRAPES) ×1 IMPLANT
GLIDESHEATH SLEND SS 6F .021 (SHEATH) ×1 IMPLANT
GUIDEWIRE INQWIRE 1.5J.035X260 (WIRE) IMPLANT
INQWIRE 1.5J .035X260CM (WIRE) ×2
KIT SYRINGE INJ CVI SPIKEX1 (MISCELLANEOUS) ×1 IMPLANT
PACK CARDIAC CATH (CUSTOM PROCEDURE TRAY) ×2 IMPLANT
PROTECTION STATION PRESSURIZED (MISCELLANEOUS) ×2
SET ATX SIMPLICITY (MISCELLANEOUS) ×1 IMPLANT
STATION PROTECTION PRESSURIZED (MISCELLANEOUS) IMPLANT

## 2021-09-08 NOTE — Interval H&P Note (Signed)
Cath Lab Visit (complete for each Cath Lab visit)  Clinical Evaluation Leading to the Procedure:   ACS: No.  Non-ACS:    Anginal Classification: CCS II  Anti-ischemic medical therapy: Minimal Therapy (1 class of medications)  Non-Invasive Test Results: Intermediate-risk stress test findings: cardiac mortality 1-3%/year  Prior CABG: No previous CABG      History and Physical Interval Note:  09/08/2021 9:28 AM  Tammy Guerra  has presented today for surgery, with the diagnosis of LT Heart Cath   Accelerating angina.  The various methods of treatment have been discussed with the patient and family. After consideration of risks, benefits and other options for treatment, the patient has consented to  Procedure(s): LEFT HEART CATH AND CORONARY ANGIOGRAPHY (N/A) as a surgical intervention.  The patient's history has been reviewed, patient examined, no change in status, stable for surgery.  I have reviewed the patient's chart and labs.  Questions were answered to the patient's satisfaction.     Kathlyn Sacramento

## 2021-09-15 ENCOUNTER — Ambulatory Visit: Payer: Self-pay | Admitting: Urology

## 2021-09-20 ENCOUNTER — Ambulatory Visit (INDEPENDENT_AMBULATORY_CARE_PROVIDER_SITE_OTHER): Payer: Medicare HMO

## 2021-09-20 DIAGNOSIS — I739 Peripheral vascular disease, unspecified: Secondary | ICD-10-CM | POA: Diagnosis not present

## 2021-09-20 DIAGNOSIS — R072 Precordial pain: Secondary | ICD-10-CM | POA: Diagnosis not present

## 2021-09-20 LAB — ECHOCARDIOGRAM COMPLETE
AR max vel: 2.88 cm2
AV Area VTI: 3.11 cm2
AV Area mean vel: 3.26 cm2
AV Mean grad: 5 mmHg
AV Peak grad: 10.9 mmHg
Ao pk vel: 1.65 m/s
Area-P 1/2: 3.45 cm2
Calc EF: 62.4 %
S' Lateral: 3.1 cm
Single Plane A2C EF: 60.3 %
Single Plane A4C EF: 66.7 %

## 2021-09-20 NOTE — Progress Notes (Unsigned)
Cardiology Office Note    Date:  09/21/2021   ID:  KATHRYN COSBY, DOB December 26, 1950, MRN [id number]  PCP:  [name], NP  Cardiologist:  Kathlyn Sacramento, MD  Electrophysiologist:  None   Chief Complaint: Follow-up  History of Present Illness:   Tammy Guerra is a 71 y.o. female with history of nonobstructive CAD by [place] in 08/2021, aortic atherosclerosis, PAD, pancreatic cyst, DM2, HTN, HLD, and tobacco use who presents for follow-up of LHC.   She was seen in 2022 for prolonged symptoms of bilateral calf claudication as well as some discomfort in the hips and thighs.  Noninvasive vascular studies showed an ABI of 0.92 on the right and 0.63 on the left.  Duplex showed severe stenosis of the right popliteal artery with chronically occluded left SFA throughout its whole length.  She was started on cilostazol along with recommendation to quit smoking and begin a walking program.  She was seen in the office on 06/01/2021 and reported she had quit smoking with noted improvement following initiation of cilostazol.  She was without angina or decompensation.  She was planning to get a treadmill for regular exercise.    She was seen in the office on 08/23/2021 noting several issues including a several month history of brief, intermittent sharp bilateral groin pain that was randomly occurring, pinpoint, and without radiation.  It was not exacerbated when ambulating on her treadmill, though did note discomfort in the left lower extremity with ambulation otherwise.  She was without nonhealing wounds or ulcers.  Noninvasive lower extremity imaging was recommended and is pending at this time.  She also noted intermittent left-sided chest discomfort and dyspnea.  Given symptoms, she underwent coronary CTA on 08/28/2021, which demonstrated a calcium score of 756 which was the 96 percentile.  The LAD had 50 to 69% proximal and mid stenosis, LCx with proximal 25 to 49% stenosis, and less than 25% stenosis involving  the proximal and distal segments of the RCA.  FFR was positive at 0.78 in the mid LAD.  Given these findings, LHC was recommended.  She presented to the ED on 09/01/2021 with chest discomfort with high-sensitivity troponin of 3.  Chest x-ray showed no acute abnormality.  EKG showed sinus rhythm with nonspecific ST-T changes.  She left without being seen.  She underwent previously scheduled LHC on 09/08/2021 which showed mild one-vessel CAD involving the LAD which was moderately calcified with 30% proximal and mid stenosis.  There was normal LV systolic function and mildly elevated LVEDP.  False positive CT FFR was felt to be likely due to calcifications.  Medical therapy was recommended.  Echo on 09/20/2021 demonstrated an EF of 55 to 60%, no regional wall motion abnormalities, mild LVH, grade 1 diastolic dysfunction, normal RV systolic function and ventricular cavity size, and aortic valve sclerosis without evidence of stenosis.  ABIs on 09/20/2021 were largely unchanged when compared to prior noninvasive imaging.  She comes in doing well today and is without symptoms of angina or decompensation.  No significant dyspnea, palpitations, dizziness, presyncope, or syncope.  She has acquired a treadmill and is also planning to join [employer] sneakers.  She continues to abstain from smoking.  She is tolerating all cardiac medications without issue.  Overall, she feels improved when compared to prior visits.  No right radial arteriotomy site complications.  She did note symptomatic improvement following addition of PPI, though prefers to not take this on a daily basis.  She does not have any active  issues or concerns at this time.   Labs independently reviewed: 08/2021 - Hgb 13.9, PLT 348, potassium 3.8, BUN 22, serum creatinine 0.88 04/2021 - A1c 6.8, TC 128, TG 102, HDL 61, LDL 48, albumin 4.4, AST/ALT normal 03/2018 - TSH normal  Past Medical History:  Diagnosis Date   Anxiety    Arthritis    Cancer ([address])     skin/CERVICAL CA   Complication of anesthesia    discomfort during first cataract   Diabetes mellitus without complication (HCC)    Emphysema lung ([place]) 07/01/2020   GERD (gastroesophageal reflux disease)    NO MEDS   History of kidney stones    STONES AND CYSTS   Kidney stone    Palpitations    Pancreatic cyst     Past Surgical History:  Procedure Laterality Date   CARDIAC CATHETERIZATION     CATARACT EXTRACTION W/PHACO Left 09/07/2014   Procedure: CATARACT EXTRACTION PHACO AND INTRAOCULAR LENS PLACEMENT ([place]);  Surgeon: Birder Robson, MD;  Location: [facility] ORS;  Service: Ophthalmology;  Laterality: Left;  [place] 01:03 AP% 27.7 CDE 17.55   CORONARY ANGIOPLASTY     EYE SURGERY     cataract   KNEE ARTHROSCOPY WITH MEDIAL MENISECTOMY Right 05/30/2017   Procedure: KNEE ARTHROSCOPY WITH MEDIAL AND LATERAL  MENISECTOMY;  Surgeon: Hessie Knows, MD;  Location: [facility] ORS;  Service: Orthopedics;  Laterality: Right;   LEFT HEART CATH AND CORONARY ANGIOGRAPHY N/A 09/08/2021   Procedure: LEFT HEART CATH AND CORONARY ANGIOGRAPHY;  Surgeon: [name], MD;  Location: [facility];  Service: Cardiovascular;  Laterality: N/A;   SYNOVECTOMY Right 05/30/2017   Procedure: SYNOVECTOMY;  Surgeon: Hessie Knows, MD;  Location: [facility] OR[facility];  Service: Orthopedics;  Laterality: Right;   TOTAL ABDOMINAL HYSTERECTOMY W/ BILATERAL SALPINGOOPHORECTOMY      Current Medications: Current Meds  Medication Sig   cilostazol (PLETAL) 100 MG tablet Take 1 tablet (100 mg total) by mouth 2 (two) times daily.   ezetimibe (ZETIA) 10 MG tablet Take 1 tablet (10 mg total) by mouth daily.   hydrocortisone (ANUSOL-HC) 2.5 % rectal cream Apply topically 2 (two) times daily.   KETOCONAZOLE, TOPICAL, 1 % SHAM Apply 1 application topically 2 (two) times a week.   lisinopril (ZESTRIL) 2.5 MG tablet Take 1 tablet by mouth once daily   meloxicam (MOBIC) 7.5 MG tablet Take 7.5 mg by mouth daily.   metFORMIN  (GLUCOPHAGE-XR) 500 MG 24 hr tablet TAKE 2 TABLETS BY MOUTH DAILY WITH DINNER.   ONETOUCH ULTRA test strip TEST BLOOD SUGAR EVERY DAY   pantoprazole (PROTONIX) 40 MG tablet Take 1 tablet (40 mg total) by mouth daily.   rosuvastatin (CRESTOR) 20 MG tablet Take 1 tablet (20 mg total) by mouth daily.   Semaglutide,0.25 or 0.'5MG'$ /DOS, (OZEMPIC, 0.25 OR 0.5 MG/DOSE,) 2 MG/1.5ML SOPN Inject 0.5 mg into the skin once a week. Start with 0.'25MG'$  once a week x 4 weeks, then increase to 0.'5MG'$  weekly.   traZODone (DESYREL) 50 MG tablet TAKE 1 TABLET (50 MG TOTAL) BY MOUTH AT BEDTIME AS NEEDED FOR SLEEP (CAN TAKE 1-2 TABS AS NEEDED ).    Allergies:   Patient has no known allergies.   Social History   Socioeconomic History   Marital status: Widowed    Spouse name: Not on file   Number of children: Not on file   Years of education: Not on file   Highest education level: Not on file  Occupational History   Not on file  Tobacco Use   Smoking status: Former    Packs/day: 0.50    Years: 41.00    Pack years: 20.50    Types: Cigarettes    Quit date: 12/19/2020    Years since quitting: 0.7   Smokeless tobacco: Never   Tobacco comments:    15 CIG DAILY  Vaping Use   Vaping Use: Never used  Substance and Sexual Activity   Alcohol use: No   Drug use: No   Sexual activity: Not Currently    Birth control/protection: Surgical  Other Topics Concern   Not on file  Social History Narrative   Lives alone.  Diane, daughter at bedside.   Social Determinants of Health   Financial Resource Strain: Medium Risk   Difficulty of Paying Living Expenses: Somewhat hard  Food Insecurity: No Food Insecurity   Worried About Charity fundraiser in the Last Year: Never true   Ran Out of Food in the Last Year: Never true  Transportation Needs: No Transportation Needs   Lack of Transportation (Medical): No   Lack of Transportation (Non-Medical): No  Physical Activity: Sufficiently Active   Days of Exercise per  Week: 5 days   Minutes of Exercise per Session: 40 min  Stress: No Stress Concern Present   Feeling of Stress : Not at all  Social Connections: Moderately Integrated   Frequency of Communication with Friends and Family: More than three times a week   Frequency of Social Gatherings with Friends and Family: Once a week   Attends Religious Services: More than 4 times per year   Active Member of [employer] or Organizations: Yes   Attends Archivist Meetings: More than 4 times per year   Marital Status: Widowed     Family History:  The patient's family history includes Alcohol abuse in her brother and father; Cancer in her brother and paternal aunt; Diabetes in her brother, brother, and father; Early death in her brother, brother, brother, and father; Heart disease in her brother and father; Stroke in her brother and father. There is no history of Breast cancer.  ROS:   12-point review of systems is negative unless otherwise noted in the HPI.   EKGs/Labs/Other Studies Reviewed:    Studies reviewed were summarized above. The additional studies were reviewed today:  2D echo 09/20/2021: 1. Left ventricular ejection fraction, by estimation, is 55 to 60%. Left  ventricular ejection fraction by 2D MOD biplane is 62.4 %. The left  ventricle has normal function. The left ventricle has no regional wall  motion abnormalities. There is mild left  ventricular hypertrophy of the basal-septal segment. Left ventricular  diastolic parameters are consistent with Grade I diastolic dysfunction  (impaired relaxation). The average left ventricular global longitudinal  strain is -21.6 %. The global  longitudinal strain is normal.   2. Right ventricular systolic function is normal. The right ventricular  size is normal.   3. The mitral valve is normal in structure. No evidence of mitral valve  regurgitation.   4. The aortic valve is tricuspid. Aortic valve regurgitation is not  visualized. Aortic  valve sclerosis is present, with no evidence of aortic  valve stenosis.   5. The inferior vena cava is normal in size with <50% respiratory  variability, suggesting right atrial pressure of 8 mmHg. __________  ABIs 09/20/2021: ABI/TBIToday's ABIToday's TBIPrevious ABIPrevious TBI  +-------+-----------+-----------+------------+------------+  Right  0.87       0.7        0.92  0.7           +-------+-----------+-----------+------------+------------+  Left   0.69       0.49       0.63        0.67          +-------+-----------+-----------+------------+------------+   Bilateral ABIs appear essentially unchanged compared to prior study on  09/2020.     Summary:  Right: Resting right ankle-brachial index indicates mild right lower  extremity arterial disease. The right toe-brachial index is normal.   Left: Resting left ankle-brachial index indicates moderate left lower  extremity arterial disease. The left toe-brachial index is abnormal. __________  [facility] 09/08/2021:   Mid LAD lesion is 30% stenosed.   Prox LAD lesion is 30% stenosed.   The left ventricular systolic function is normal.   LV end diastolic pressure is mildly elevated.   The left ventricular ejection fraction is 55-65% by visual estimate.   1.  Mild one-vessel coronary artery disease involving the LAD which is moderately calcified.  False positive CT FFR likely due to calcifications. 2.  Normal LV systolic function mildly elevated left ventricular end-diastolic pressure.   Recommendations: Continue aggressive medical therapy.  __________  Coronary CTA with CT FFR 08/28/2021: FINDINGS: Aorta: Normal size. Aortic root and descending aorta calcifications. No dissection.   Aortic Valve:  Trileaflet.  No calcifications.   Coronary Arteries:  Normal coronary origin.  Right dominance.   RCA is a large dominant artery that gives rise to PDA and PLA. There is calcified plaque in the proximal and distal  segments causing mild (<25%) stenosis.   Left main is a large artery that gives rise to LAD and LCX arteries. There is no LM disease.   LAD has calcified plaque in the proximal and mid segments causing moderate stenosis (50-69%).   LCX is a non-dominant artery that gives rise to one two branches. There is calcified plaque in the proximal LCx causing mild stenosis (25-49%).   Other findings:   Normal pulmonary vein drainage into the left atrium.   Normal left atrial appendage without a thrombus.   Normal size of the pulmonary artery.   IMPRESSION: 1. Coronary calcium score of 756. This was 96th percentile for age and sex matched control. 2.  Normal coronary origin with right dominance. 3. Calcified plaque causing moderate proximal to mid LAD stenosis (50-69%). 4.  Mild LCx stenosis (25%), minimal RCA stenosis (<25%). 5. CAD-RADS 3. Moderate stenosis. Consider symptom-guided anti-ischemic pharmacotherapy as well as risk factor modification per guideline directed care. 6. Additional analysis with CT FFR will be submitted and reported separately.     CT FFR -  1. Left Main:  No significant stenosis. 2. LAD: significant mid LAD stenosis.  FFRct 0.78 3. LCX: No significant stenosis.  FFRct 0.95 4. RCA: No significant stenosis.  FFRct 0.92   IMPRESSION: 1. CT FFR analysis showed significant stenosis in the mid-distal LAD, FFRct 0.78. 2.  Recommend antianginal therapy. 3. Recommend cardiac cath if symptoms persist despite medical therapy. __________   ABI 09/27/2020: ABI/TBIToday's ABIToday's TBIPrevious ABIPrevious TBI  +-------+-----------+-----------+------------+------------+  Right  0.92       0.7        1.06        0.86          +-------+-----------+-----------+------------+------------+  Left   0.63       0.67       0.78        0.59          +-------+-----------+-----------+------------+------------+  Bilateral ABIs appear decreased compared to  prior study on 10/28/17.     Summary:  Right: Resting right ankle-brachial index indicates mild right lower  extremity arterial disease. The right toe-brachial index is normal.   Left: Resting left ankle-brachial index indicates moderate left lower  extremity arterial disease. The left toe-brachial index is abnormal.  __________   Arterial ultrasound 09/27/2020: Summary:  Right: 75-99% stenosis noted in the superficial femoral artery and/or  popliteal artery.   Left: Total occlusion noted in the superficial femoral artery.    EKG:  EKG is ordered today.  The EKG ordered today demonstrates NSR, 67 bpm, low voltage QRS, anterior T wave inversion versus lead placement  Recent Labs: 05/10/2021: ALT 22 09/01/2021: BUN 22; Creatinine, Ser 0.88; Hemoglobin 13.9; Platelets 348; Potassium 3.8; Sodium 137  Recent Lipid Panel    Component Value Date/Time   CHOL 128 05/10/2021 0920   TRIG 102 05/10/2021 0920   HDL 61 05/10/2021 0920   CHOLHDL 2.1 05/10/2021 0920   LDLCALC 48 05/10/2021 0920    PHYSICAL EXAM:    VS:  BP 114/66 (BP Location: Left Arm, Patient Position: Sitting, Cuff Size: Normal)   Pulse 67   Ht '5\' 6"'$  (1.676 m)   Wt 198 lb (89.8 kg)   BMI 31.96 kg/m   BMI: Body mass index is 31.96 kg/m.  Physical Exam Vitals reviewed.  Constitutional:      Appearance: She is well-developed.  HENT:     Head: Normocephalic and atraumatic.  Eyes:     General:        Right eye: No discharge.        Left eye: No discharge.  Neck:     Vascular: No JVD.  Cardiovascular:     Rate and Rhythm: Normal rate and regular rhythm.     Pulses:          Posterior tibial pulses are 1+ on the right side and 1+ on the left side.     Heart sounds: S1 normal and S2 normal. Heart sounds not distant. No midsystolic click and no opening snap. Murmur heard.  Systolic murmur is present with a grade of 1/6 at the upper right sternal border.    No friction rub.     Comments: Right radial arteriotomy site  is well-healed without active bleeding, bruising, swelling, warmth, erythema, or tenderness to palpation.  Radial pulse 2+ proximal and distal to the arteriotomy site. Pulmonary:     Effort: Pulmonary effort is normal. No respiratory distress.     Breath sounds: Normal breath sounds. No decreased breath sounds, wheezing or rales.  Chest:     Chest wall: No tenderness.  Abdominal:     General: There is no distension.     Palpations: Abdomen is soft.     Tenderness: There is no abdominal tenderness.  Musculoskeletal:     Cervical back: Normal range of motion.     Right lower leg: No edema.     Left lower leg: No edema.  Skin:    General: Skin is warm and dry.     Nails: There is no clubbing.  Neurological:     Mental Status: She is alert and oriented to person, place, and time.  Psychiatric:        Speech: Speech normal.        Behavior: Behavior normal.        Thought Content: Thought content normal.        Judgment: Judgment normal.  Wt Readings from Last 3 Encounters:  09/21/21 198 lb (89.8 kg)  09/08/21 190 lb (86.2 kg)  09/01/21 112 lb (50.8 kg)     ASSESSMENT & PLAN:   Nonobstructive CAD/HLD: LHC last month showed nonobstructive disease as outlined above.  She has not had any further symptoms of angina.  Continue aggressive risk factor modification and current medical therapy.  No right radial arteriotomy site complications.  LDL 48.  No indication for further ischemic testing at this time.  PAD with claudication: Symptoms overall stable.  ABIs updated in 08/2021 were largely unchanged.  She remains on cilostazol and rosuvastatin.  Continue walking regimen and abstinence from smoking.  HTN: Blood pressure is well controlled in the office today.  She remains on low-dose lisinopril.  Tobacco use: She continues to abstain from smoking.  With smoking cessation, she has gained some weight.  She will work on heart healthy diet and regular exercise.  If weight continues to be an  issue despite lifestyle modification, given comorbid conditions, we may be able to consider [place] down the road for risk factor modification.    Disposition: F/u with Dr. Fletcher Anon or an APP in 5-6 months.   Medication Adjustments/Labs and Tests Ordered: Current medicines are reviewed at length with the patient today.  Concerns regarding medicines are outlined above. Medication changes, Labs and Tests ordered today are summarized above and listed in the Patient Instructions accessible in Encounters.   Signed, Christell Faith, PA-C 09/21/2021 8:46 AM     [address] [place], [place] [address] [phone]

## 2021-09-21 ENCOUNTER — Ambulatory Visit: Payer: Medicare HMO | Admitting: Physician Assistant

## 2021-09-21 ENCOUNTER — Encounter: Payer: Self-pay | Admitting: Physician Assistant

## 2021-09-21 VITALS — BP 114/66 | HR 67 | Ht 66.0 in | Wt 198.0 lb

## 2021-09-21 DIAGNOSIS — Z87891 Personal history of nicotine dependence: Secondary | ICD-10-CM | POA: Diagnosis not present

## 2021-09-21 DIAGNOSIS — E785 Hyperlipidemia, unspecified: Secondary | ICD-10-CM

## 2021-09-21 DIAGNOSIS — I10 Essential (primary) hypertension: Secondary | ICD-10-CM

## 2021-09-21 DIAGNOSIS — I739 Peripheral vascular disease, unspecified: Secondary | ICD-10-CM | POA: Diagnosis not present

## 2021-09-21 DIAGNOSIS — I2510 Atherosclerotic heart disease of native coronary artery without angina pectoris: Secondary | ICD-10-CM

## 2021-09-21 NOTE — Patient Instructions (Signed)
Medication Instructions:  No changes at this time.   *If you need a refill on your cardiac medications before your next appointment, please call your pharmacy*   Lab Work: None  If you have labs (blood work) drawn today and your tests are completely normal, you will receive your results only by: [place] (if you have MyChart) OR A paper copy in the mail If you have any lab test that is abnormal or we need to change your treatment, we will call you to review the results.   Testing/Procedures: None   Follow-Up: At [facility], you and your health needs are our priority.  As part of our continuing mission to provide you with exceptional heart care, we have created designated Provider Care Teams.  These Care Teams include your primary Cardiologist (physician) and Advanced Practice Providers (APPs -  Physician Assistants and Nurse Practitioners) who all work together to provide you with the care you need, when you need it.   Your next appointment:   Cancel appointment with Dr. Fletcher Anon for 10/11/2021 Reschedule appointment for November with Dr. Yehuda Savannah for PAD  The format for your next appointment:   In Person  Provider:   Kathlyn Sacramento, MD       Important Information About Sugar

## 2021-09-22 DIAGNOSIS — D0461 Carcinoma in situ of skin of right upper limb, including shoulder: Secondary | ICD-10-CM | POA: Diagnosis not present

## 2021-09-22 DIAGNOSIS — D485 Neoplasm of uncertain behavior of skin: Secondary | ICD-10-CM | POA: Diagnosis not present

## 2021-09-22 DIAGNOSIS — L821 Other seborrheic keratosis: Secondary | ICD-10-CM | POA: Diagnosis not present

## 2021-09-25 ENCOUNTER — Ambulatory Visit: Payer: Medicare HMO

## 2021-10-11 ENCOUNTER — Ambulatory Visit: Payer: Medicare HMO | Admitting: Cardiovascular Disease

## 2021-10-16 ENCOUNTER — Ambulatory Visit (INDEPENDENT_AMBULATORY_CARE_PROVIDER_SITE_OTHER): Payer: Medicare HMO | Admitting: Nurse Practitioner

## 2021-10-16 ENCOUNTER — Encounter: Payer: Self-pay | Admitting: Nurse Practitioner

## 2021-10-16 ENCOUNTER — Other Ambulatory Visit: Payer: Self-pay | Admitting: Physician Assistant

## 2021-10-16 VITALS — BP 114/73 | HR 81 | Temp 98.7°F | Ht 65.0 in | Wt 196.8 lb

## 2021-10-16 DIAGNOSIS — I25118 Atherosclerotic heart disease of native coronary artery with other forms of angina pectoris: Secondary | ICD-10-CM | POA: Diagnosis not present

## 2021-10-16 DIAGNOSIS — R8290 Unspecified abnormal findings in urine: Secondary | ICD-10-CM

## 2021-10-16 DIAGNOSIS — J439 Emphysema, unspecified: Secondary | ICD-10-CM

## 2021-10-16 DIAGNOSIS — Z0000 Encounter for general adult medical examination without abnormal findings: Secondary | ICD-10-CM | POA: Diagnosis not present

## 2021-10-16 DIAGNOSIS — R69 Illness, unspecified: Secondary | ICD-10-CM | POA: Diagnosis not present

## 2021-10-16 DIAGNOSIS — I739 Peripheral vascular disease, unspecified: Secondary | ICD-10-CM | POA: Diagnosis not present

## 2021-10-16 DIAGNOSIS — I700 Atherosclerosis of aorta: Secondary | ICD-10-CM | POA: Diagnosis not present

## 2021-10-16 DIAGNOSIS — E118 Type 2 diabetes mellitus with unspecified complications: Secondary | ICD-10-CM | POA: Diagnosis not present

## 2021-10-16 DIAGNOSIS — F411 Generalized anxiety disorder: Secondary | ICD-10-CM

## 2021-10-16 LAB — URINALYSIS, ROUTINE W REFLEX MICROSCOPIC
Bilirubin, UA: NEGATIVE
Glucose, UA: NEGATIVE
Ketones, UA: NEGATIVE
Nitrite, UA: NEGATIVE
Specific Gravity, UA: 1.025 (ref 1.005–1.030)
Urobilinogen, Ur: 0.2 mg/dL (ref 0.2–1.0)
pH, UA: 6 (ref 5.0–7.5)

## 2021-10-16 LAB — MICROSCOPIC EXAMINATION

## 2021-10-16 MED ORDER — ALPRAZOLAM 0.25 MG PO TABS: ORAL_TABLET | ORAL | 0 refills | Status: DC

## 2021-10-16 NOTE — Assessment & Plan Note (Signed)
Chronic. Ongoing.  Has needed increased use of Xanax recently due to cardiac concerns.  Cardiologist dose believe some of her issues are anxiety related.  Refilled Xanax today.  30 pills should last around 45 days.  Will refill before next appt.  Follow up in 3 months for reevaluation.

## 2021-10-17 LAB — LIPID PANEL
Chol/HDL Ratio: 2 ratio (ref 0.0–4.4)
Cholesterol, Total: 114 mg/dL (ref 100–199)
HDL: 56 mg/dL (ref >39)
LDL Chol Calc (NIH): 44 mg/dL (ref 0–99)
Triglycerides: 63 mg/dL (ref 0–149)
VLDL Cholesterol Cal: 14 mg/dL (ref 5–40)

## 2021-10-17 LAB — COMPREHENSIVE METABOLIC PANEL
ALT: 24 IU/L (ref 0–32)
AST: 19 IU/L (ref 0–40)
Albumin/Globulin Ratio: 2.6 — ABNORMAL HIGH (ref 1.2–2.2)
Albumin: 4.7 g/dL (ref 3.8–4.8)
Alkaline Phosphatase: 71 IU/L (ref 44–121)
BUN/Creatinine Ratio: 23 (ref 12–28)
BUN: 14 mg/dL (ref 8–27)
Bilirubin Total: 0.3 mg/dL (ref 0.0–1.2)
CO2: 22 mmol/L (ref 20–29)
Calcium: 9.3 mg/dL (ref 8.7–10.3)
Chloride: 101 mmol/L (ref 96–106)
Creatinine, Ser: 0.6 mg/dL (ref 0.57–1.00)
Globulin, Total: 1.8 g/dL (ref 1.5–4.5)
Glucose: 109 mg/dL — ABNORMAL HIGH (ref 70–99)
Potassium: 4.5 mmol/L (ref 3.5–5.2)
Sodium: 135 mmol/L (ref 134–144)
Total Protein: 6.5 g/dL (ref 6.0–8.5)
eGFR: 97 mL/min/{1.73_m2} (ref >59)

## 2021-10-17 LAB — CBC WITH DIFFERENTIAL/PLATELET
Basophils Absolute: 0.1 10*3/uL (ref 0.0–0.2)
Basos: 1 %
EOS (ABSOLUTE): 0.1 10*3/uL (ref 0.0–0.4)
Eos: 1 %
Hematocrit: 40 % (ref 34.0–46.6)
Hemoglobin: 13.3 g/dL (ref 11.1–15.9)
Immature Grans (Abs): 0 10*3/uL (ref 0.0–0.1)
Immature Granulocytes: 0 %
Lymphocytes Absolute: 2.2 10*3/uL (ref 0.7–3.1)
Lymphs: 27 %
MCH: 30.9 pg (ref 26.6–33.0)
MCHC: 33.3 g/dL (ref 31.5–35.7)
MCV: 93 fL (ref 79–97)
Monocytes Absolute: 0.5 10*3/uL (ref 0.1–0.9)
Monocytes: 6 %
Neutrophils Absolute: 5.3 10*3/uL (ref 1.4–7.0)
Neutrophils: 65 %
Platelets: 317 10*3/uL (ref 150–450)
RBC: 4.3 x10E6/uL (ref 3.77–5.28)
RDW: 12.4 % (ref 11.7–15.4)
WBC: 8.2 10*3/uL (ref 3.4–10.8)

## 2021-10-17 LAB — TSH: TSH: 0.579 u[IU]/mL (ref 0.450–4.500)

## 2021-10-17 LAB — HEMOGLOBIN A1C
Est. average glucose Bld gHb Est-mCnc: 146 mg/dL
Hgb A1c MFr Bld: 6.7 % — ABNORMAL HIGH (ref 4.8–5.6)

## 2021-10-18 LAB — URINE CULTURE

## 2021-10-18 NOTE — Progress Notes (Signed)
Hi Tammy Guerra.  There was no bacteria on your urine culture.  No need for treatment with antibiotics.  See you at our next visit.

## 2021-10-19 ENCOUNTER — Other Ambulatory Visit: Payer: Self-pay | Admitting: Nurse Practitioner

## 2021-10-19 DIAGNOSIS — E1165 Type 2 diabetes mellitus with hyperglycemia: Secondary | ICD-10-CM

## 2021-10-19 NOTE — Telephone Encounter (Signed)
Requested Prescriptions  Pending Prescriptions Disp Refills  . lisinopril (ZESTRIL) 2.5 MG tablet [Pharmacy Med Name: Lisinopril 2.5 MG Oral Tablet] 90 tablet 0    Sig: Take 1 tablet by mouth once daily     Cardiovascular:  ACE Inhibitors Passed - 10/19/2021  9:10 AM      Passed - Cr in normal range and within 180 days    Creatinine, Ser  Date Value Ref Range Status  10/16/2021 0.60 0.57 - 1.00 mg/dL Final         Passed - K in normal range and within 180 days    Potassium  Date Value Ref Range Status  10/16/2021 4.5 3.5 - 5.2 mmol/L Final         Passed - Patient is not pregnant      Passed - Last BP in normal range    BP Readings from Last 1 Encounters:  10/16/21 114/73         Passed - Valid encounter within last 6 months    Recent Outpatient Visits          3 days ago Annual physical exam   [facility] [name], NP   5 months ago Pulmonary emphysema, unspecified emphysema type ([place])   [facility] [name], NP   9 months ago Pulmonary emphysema, unspecified emphysema type ([place])   [facility] [name], NP   1 year ago Uncontrolled type 2 diabetes mellitus with hyperglycemia ([place])   [facility] [name], NP   1 year ago Acute cystitis with hematuria   [facility] [name], NP      Future Appointments            In 4 months Fletcher Anon, Mertie Clause, MD [facility], [place]

## 2021-10-19 NOTE — Telephone Encounter (Signed)
Duplicate request- filled today Requested Prescriptions  Pending Prescriptions Disp Refills  . lisinopril (ZESTRIL) 2.5 MG tablet [Pharmacy Med Name: Lisinopril 2.5 MG Oral Tablet] 90 tablet 0    Sig: Take 1 tablet by mouth once daily     Cardiovascular:  ACE Inhibitors Passed - 10/19/2021 12:09 PM      Passed - Cr in normal range and within 180 days    Creatinine, Ser  Date Value Ref Range Status  10/16/2021 0.60 0.57 - 1.00 mg/dL Final         Passed - K in normal range and within 180 days    Potassium  Date Value Ref Range Status  10/16/2021 4.5 3.5 - 5.2 mmol/L Final         Passed - Patient is not pregnant      Passed - Last BP in normal range    BP Readings from Last 1 Encounters:  10/16/21 114/73         Passed - Valid encounter within last 6 months    Recent Outpatient Visits          3 days ago Annual physical exam   [facility] [name], NP   5 months ago Pulmonary emphysema, unspecified emphysema type ([place])   [facility] [name], NP   9 months ago Pulmonary emphysema, unspecified emphysema type ([place])   [facility] [name], NP   1 year ago Uncontrolled type 2 diabetes mellitus with hyperglycemia ([place])   [facility] [name], NP   1 year ago Acute cystitis with hematuria   [facility] [name], NP      Future Appointments            In 4 months Fletcher Anon, Mertie Clause, MD [facility], [place]

## 2021-10-23 ENCOUNTER — Other Ambulatory Visit: Payer: Self-pay | Admitting: Cardiovascular Disease

## 2021-10-30 DIAGNOSIS — Z7985 Long-term (current) use of injectable non-insulin antidiabetic drugs: Secondary | ICD-10-CM | POA: Diagnosis not present

## 2021-10-30 DIAGNOSIS — Z809 Family history of malignant neoplasm, unspecified: Secondary | ICD-10-CM | POA: Diagnosis not present

## 2021-10-30 DIAGNOSIS — E669 Obesity, unspecified: Secondary | ICD-10-CM | POA: Diagnosis not present

## 2021-10-30 DIAGNOSIS — R69 Illness, unspecified: Secondary | ICD-10-CM | POA: Diagnosis not present

## 2021-10-30 DIAGNOSIS — Z7984 Long term (current) use of oral hypoglycemic drugs: Secondary | ICD-10-CM | POA: Diagnosis not present

## 2021-10-30 DIAGNOSIS — I10 Essential (primary) hypertension: Secondary | ICD-10-CM | POA: Diagnosis not present

## 2021-10-30 DIAGNOSIS — Z6831 Body mass index (BMI) 31.0-31.9, adult: Secondary | ICD-10-CM | POA: Diagnosis not present

## 2021-10-30 DIAGNOSIS — E1151 Type 2 diabetes mellitus with diabetic peripheral angiopathy without gangrene: Secondary | ICD-10-CM | POA: Diagnosis not present

## 2021-10-30 DIAGNOSIS — E785 Hyperlipidemia, unspecified: Secondary | ICD-10-CM | POA: Diagnosis not present

## 2021-10-30 DIAGNOSIS — G4700 Insomnia, unspecified: Secondary | ICD-10-CM | POA: Diagnosis not present

## 2021-10-30 DIAGNOSIS — I2510 Atherosclerotic heart disease of native coronary artery without angina pectoris: Secondary | ICD-10-CM | POA: Diagnosis not present

## 2021-10-30 DIAGNOSIS — M1990 Unspecified osteoarthritis, unspecified site: Secondary | ICD-10-CM | POA: Diagnosis not present

## 2021-11-06 DIAGNOSIS — Z1231 Encounter for screening mammogram for malignant neoplasm of breast: Secondary | ICD-10-CM | POA: Diagnosis not present

## 2021-11-06 LAB — HM MAMMOGRAPHY

## 2021-11-07 ENCOUNTER — Other Ambulatory Visit: Payer: Self-pay

## 2021-11-07 DIAGNOSIS — Z122 Encounter for screening for malignant neoplasm of respiratory organs: Secondary | ICD-10-CM

## 2021-11-07 DIAGNOSIS — Z87891 Personal history of nicotine dependence: Secondary | ICD-10-CM

## 2021-11-07 DIAGNOSIS — F17200 Nicotine dependence, unspecified, uncomplicated: Secondary | ICD-10-CM

## 2021-11-09 ENCOUNTER — Other Ambulatory Visit: Payer: Self-pay

## 2021-11-09 ENCOUNTER — Encounter: Payer: Self-pay | Admitting: Nurse Practitioner

## 2021-11-09 MED ORDER — ONETOUCH ULTRA 2 W/DEVICE KIT: 1.0000 | PACK | Freq: Every day | 0 refills | Status: DC

## 2021-11-17 DIAGNOSIS — L578 Other skin changes due to chronic exposure to nonionizing radiation: Secondary | ICD-10-CM | POA: Diagnosis not present

## 2021-11-17 DIAGNOSIS — D225 Melanocytic nevi of trunk: Secondary | ICD-10-CM | POA: Diagnosis not present

## 2021-11-17 DIAGNOSIS — L821 Other seborrheic keratosis: Secondary | ICD-10-CM | POA: Diagnosis not present

## 2021-11-17 DIAGNOSIS — C44622 Squamous cell carcinoma of skin of right upper limb, including shoulder: Secondary | ICD-10-CM | POA: Diagnosis not present

## 2021-11-27 ENCOUNTER — Other Ambulatory Visit: Payer: Self-pay

## 2021-11-27 ENCOUNTER — Ambulatory Visit
Admission: RE | Admit: 2021-11-27 | Discharge: 2021-11-27 | Disposition: A | Discharge status: Home / Self Care | Payer: Medicare HMO | Source: Ambulatory Visit | Attending: Acute Care | Admitting: Acute Care

## 2021-11-27 DIAGNOSIS — F17200 Nicotine dependence, unspecified, uncomplicated: Secondary | ICD-10-CM | POA: Insufficient documentation

## 2021-11-27 DIAGNOSIS — Z87891 Personal history of nicotine dependence: Secondary | ICD-10-CM | POA: Insufficient documentation

## 2021-11-27 DIAGNOSIS — Z122 Encounter for screening for malignant neoplasm of respiratory organs: Secondary | ICD-10-CM | POA: Insufficient documentation

## 2021-11-27 DIAGNOSIS — R69 Illness, unspecified: Secondary | ICD-10-CM | POA: Diagnosis not present

## 2021-12-03 ENCOUNTER — Other Ambulatory Visit: Payer: Self-pay | Admitting: Nurse Practitioner

## 2021-12-04 NOTE — Telephone Encounter (Signed)
Does not need another kit with device. Requested Prescriptions  Pending Prescriptions Disp Refills  . Blood Glucose Monitoring Suppl (ONE TOUCH ULTRA 2) w/Device KIT [Pharmacy Med Name: ONETOUCH ULTRA 2   KIT]  0    Sig: USE AS DIRECTED     Endocrinology: Diabetes - Testing Supplies Passed - 12/03/2021 11:22 AM      Passed - Valid encounter within last 12 months    Recent Outpatient Visits          1 month ago Annual physical exam   [facility] [name], NP   6 months ago Pulmonary emphysema, unspecified emphysema type ([place])   [facility] [name], NP   10 months ago Pulmonary emphysema, unspecified emphysema type ([place])   [facility] [name], NP   1 year ago Uncontrolled type 2 diabetes mellitus with hyperglycemia ([place])   [facility] [name], NP   1 year ago Acute cystitis with hematuria   [place], NP      Future Appointments            In 2 months Fletcher Anon, Mertie Clause, MD [facility], [place]

## 2021-12-06 DIAGNOSIS — D0461 Carcinoma in situ of skin of right upper limb, including shoulder: Secondary | ICD-10-CM | POA: Diagnosis not present

## 2022-01-02 ENCOUNTER — Encounter: Payer: Self-pay | Admitting: Nurse Practitioner

## 2022-01-03 ENCOUNTER — Telehealth: Payer: Self-pay

## 2022-01-03 ENCOUNTER — Other Ambulatory Visit: Payer: Self-pay | Admitting: Nurse Practitioner

## 2022-01-03 NOTE — Progress Notes (Signed)
Chronic Care Management Pharmacy Assistant   Name: Tammy Guerra  MRN: [id number] DOB: February 10, 1951   Reason for Encounter: Disease State   Conditions to be addressed/monitored: DMII   Recent office visits:  10/16/21 Tammy Billings, NP (Annual Exam) Orders placed: Labs; Medication changes: none  Recent consult visits:  09/21/21 Tammy Mu, PA-C (CAD) Orders placed: EKG, Medication changes: none   Hospital visits:  Medication Reconciliation was completed by comparing discharge summary, patient's EMR and Pharmacy list, and upon discussion with patient.  Admitted to the hospital on 09/08/21 due to left heart cath. Discharge date was 09/08/21. Discharged from [facility].     Medications that remain the same after Hospital Discharge:??  -All other medications will remain the same.    Admitted to the hospital on 09/01/21 due to chest pain. Discharge date was 09/01/21. Discharged from [facility].   Medications that remain the same after Hospital Discharge:??  -All other medications will remain the same.   Medications: Outpatient Encounter Medications as of 01/03/2022  Medication Sig   ALPRAZolam (XANAX) 0.25 MG tablet TAKE 1 TABLET BY MOUTH AS NEEDED FOR ANXIETY.   Blood Glucose Monitoring Suppl (ONE TOUCH ULTRA 2) w/Device KIT 1 each by Does not apply route daily.   cilostazol (PLETAL) 100 MG tablet Take 1 tablet (100 mg total) by mouth 2 (two) times daily.   ezetimibe (ZETIA) 10 MG tablet Take 1 tablet by mouth once daily   hydrocortisone (ANUSOL-HC) 2.5 % rectal cream Apply topically 2 (two) times daily.   KETOCONAZOLE, TOPICAL, 1 % SHAM Apply 1 application topically 2 (two) times a week. (Patient not taking: Reported on 10/16/2021)   lisinopril (ZESTRIL) 2.5 MG tablet Take 1 tablet by mouth once daily   meloxicam (MOBIC) 7.5 MG tablet Take 7.5 mg by mouth daily.   metFORMIN (GLUCOPHAGE-XR) 500 MG 24 hr tablet TAKE 2 TABLETS BY  MOUTH DAILY WITH DINNER.   ONETOUCH ULTRA test strip TEST BLOOD SUGAR EVERY DAY   pantoprazole (PROTONIX) 40 MG tablet Take 1 tablet (40 mg total) by mouth daily.   rosuvastatin (CRESTOR) 20 MG tablet Take 1 tablet (20 mg total) by mouth daily.   Semaglutide,0.25 or 0.5MG/DOS, (OZEMPIC, 0.25 OR 0.5 MG/DOSE,) 2 MG/1.5ML SOPN Inject 0.5 mg into the skin once a week. Start with 0.25MG once a week x 4 weeks, then increase to 0.5MG weekly.   traZODone (DESYREL) 50 MG tablet TAKE 1 TABLET (50 MG TOTAL) BY MOUTH AT BEDTIME AS NEEDED FOR SLEEP (CAN TAKE 1-2 TABS AS NEEDED ).   No facility-administered encounter medications on file as of 01/03/2022.   Recent Relevant Labs: Lab Results  Component Value Date/Time   HGBA1C 6.7 (H) 10/16/2021 10:31 AM   HGBA1C 6.8 (H) 05/10/2021 09:20 AM   MICROALBUR 10 07/01/2020 09:08 AM    Kidney Function Lab Results  Component Value Date/Time   CREATININE 0.60 10/16/2021 10:31 AM   CREATININE 0.88 09/01/2021 12:03 PM   GFRNONAA >60 09/01/2021 12:03 PM   GFRAA >60 05/27/2017 09:29 AM    Current antihyperglycemic regimen:  Metformin 500 mg 2 tabs daily with dinner Ozempic 0.25 mg Inject 0.5 mg into the skin once a week. Start with 0.25MG once a week x 4 weeks, then increase to 0.5MG weekly.  What recent interventions/DTPs have been made to improve glycemic control:  None  Have there been any recent hospitalizations or ED visits since last visit with CPP? Yes  Patient denies  hypoglycemic symptoms, including None  Patient denies hyperglycemic symptoms, including none  How often are you checking your blood sugar? Patient states that she does not check blood sugar every day  What are your blood sugars ranging? Patient states that blood sugar ranges between 130-177 Fasting: yesterday 177 and this morning 167  During the week, how often does your blood glucose drop below 70? Never  Are you checking your feet daily/regularly? Patient states that she is not  having any redness, swelling or open sores on feet.  Adherence Review: Is the patient currently on a STATIN medication? Yes, Rosuvastatin 20 mg Is the patient currently on ACE/ARB medication? Yes, Lisinopril 2.5 mg Does the patient have >5 day gap between last estimated fill dates? Yes    Care Gaps: Colonoscopy-11/17/19 Diabetic Foot Exam-10/17/20 Mammogram-11/06/21 Ophthalmology-02/08/21 Dexa Scan - NA Annual Well Visit - 10/16/21 Tammy Guerra) Micro albumin-07/01/20 Hemoglobin A1c- 10/16/21  Star Rating Drugs: Lisinopril 2.5 mg-last fill 10/23/21 90 ds, 07/26/21 90 ds Metformin 500 mg-last fill 10/29/21 90 ds, 08/08/21 90 ds Rosuvastatin 20 mg-last fill 10/13/21 90 ds, 07/18/21 90 ds Ozempic 0.25 mg-last fill 05/10/21 56 ds, 10/31/20 84 ds   [place] [phone]

## 2022-01-03 NOTE — Telephone Encounter (Signed)
[facility] request rx for one touch ultra test strips and lancets , please advise.

## 2022-01-04 MED ORDER — ONETOUCH ULTRA VI STRP: ORAL_STRIP | 1 refills | Status: DC

## 2022-01-07 ENCOUNTER — Encounter: Payer: Self-pay | Admitting: Nurse Practitioner

## 2022-01-10 ENCOUNTER — Telehealth: Payer: Self-pay | Admitting: Nurse Practitioner

## 2022-01-10 NOTE — Telephone Encounter (Signed)
Noted  

## 2022-01-10 NOTE — Telephone Encounter (Signed)
PT just dropped off and said that it was papers for provider to fill out.  It is [employer] to be filled out.  Call when complete.

## 2022-01-10 NOTE — Telephone Encounter (Signed)
Forms placed in your folder.

## 2022-01-11 NOTE — Telephone Encounter (Signed)
Forms have been signed by provider and placed in Nathan's folder.

## 2022-01-11 NOTE — Telephone Encounter (Signed)
Form completed.

## 2022-01-13 ENCOUNTER — Other Ambulatory Visit: Payer: Self-pay | Admitting: Nurse Practitioner

## 2022-01-13 DIAGNOSIS — E1165 Type 2 diabetes mellitus with hyperglycemia: Secondary | ICD-10-CM

## 2022-01-15 NOTE — Telephone Encounter (Signed)
Requested Prescriptions  Pending Prescriptions Disp Refills  . lisinopril (ZESTRIL) 2.5 MG tablet [Pharmacy Med Name: Lisinopril 2.5 MG Oral Tablet] 90 tablet 0    Sig: Take 1 tablet by mouth once daily     Cardiovascular:  ACE Inhibitors Passed - 01/13/2022  9:33 AM      Passed - Cr in normal range and within 180 days    Creatinine, Ser  Date Value Ref Range Status  10/16/2021 0.60 0.57 - 1.00 mg/dL Final         Passed - K in normal range and within 180 days    Potassium  Date Value Ref Range Status  10/16/2021 4.5 3.5 - 5.2 mmol/L Final         Passed - Patient is not pregnant      Passed - Last BP in normal range    BP Readings from Last 1 Encounters:  10/16/21 114/73         Passed - Valid encounter within last 6 months    Recent Outpatient Visits          3 months ago Annual physical exam   [facility] [name], NP   8 months ago Pulmonary emphysema, unspecified emphysema type ([place])   [facility] [name], NP   12 months ago Pulmonary emphysema, unspecified emphysema type ([place])   [facility] [name], NP   1 year ago Uncontrolled type 2 diabetes mellitus with hyperglycemia ([place])   [facility] [name], NP   1 year ago Acute cystitis with hematuria   [facility] [name], NP      Future Appointments            In 1 month Fletcher Anon, Mertie Clause, MD [place]. [place]

## 2022-01-16 ENCOUNTER — Other Ambulatory Visit: Payer: Self-pay | Admitting: Nurse Practitioner

## 2022-01-16 NOTE — Telephone Encounter (Signed)
Requested medication (s) are due for refill today: yes  Requested medication (s) are on the active medication list: yes  Last refill:  10/16/21 #30 with 0 RF  Future visit scheduled: no, seen 10/16/21  Notes to clinic:  This medication can not be delegated, please assess.        Requested Prescriptions  Pending Prescriptions Disp Refills   ALPRAZolam (XANAX) 0.25 MG tablet 30 tablet 0    Sig: TAKE 1 TABLET BY MOUTH AS NEEDED FOR ANXIETY.     Not Delegated - Psychiatry: Anxiolytics/Hypnotics 2 Failed - 01/16/2022  3:47 PM      Failed - This refill cannot be delegated      Failed - Urine Drug Screen completed in last 360 days      Passed - Patient is not pregnant      Passed - Valid encounter within last 6 months    Recent Outpatient Visits           3 months ago Annual physical exam   [place], NP   8 months ago Pulmonary emphysema, unspecified emphysema type ([place])   [facility] [name], NP   12 months ago Pulmonary emphysema, unspecified emphysema type ([place])   [facility] [name], NP   1 year ago Uncontrolled type 2 diabetes mellitus with hyperglycemia ([place])   [facility] [name], NP   1 year ago Acute cystitis with hematuria   [place], NP       Future Appointments             In 1 month Fletcher Anon, Mertie Clause, MD [place]. [place]

## 2022-01-16 NOTE — Telephone Encounter (Signed)
Med refill req for Xanax, last filled 10/16/21 #30 o RF. Last seen on 10/16/21. No f/up appt scheduled

## 2022-01-16 NOTE — Telephone Encounter (Signed)
Pt calling to follow up on forms. Pt would like to know how long it takes to know if she is approved. Pt stated she only one more week of medication Ozempic left.   Please advise.

## 2022-01-16 NOTE — Telephone Encounter (Signed)
Medication Refill - Medication: ALPRAZolam (XANAX) 0.25 MG tablet  Has the patient contacted their pharmacy? No. No, more refills.   (Agent: If no, request that the patient contact the pharmacy for the refill. If patient does not wish to contact the pharmacy document the reason why and proceed with request.)   Preferred Pharmacy (with phone number or street name):  [facility] (N), [place] [address]  [place] ([place]) [place] [address]  Phone: [phone] Fax: [phone]  Hours: Not open 24 hours   Has the patient been seen for an appointment in the last year OR does the patient have an upcoming appointment? Yes.    Agent: Please be advised that RX refills may take up to 3 business days. We ask that you follow-up with your pharmacy.

## 2022-01-17 ENCOUNTER — Telehealth: Payer: Self-pay

## 2022-01-17 MED ORDER — ALPRAZOLAM 0.25 MG PO TABS: ORAL_TABLET | ORAL | 0 refills | Status: DC

## 2022-01-17 NOTE — Progress Notes (Signed)
    Chronic Care Management Pharmacy Assistant   Name: MAURYA NETHERY  MRN: [id number] DOB: 1950/06/20    [place]  to follow up on patient assistance application for Ozempic. Per representative at [employer]  that the patient is not on auto refill and will need the provider to complete a refill form and dosage of medication and fax to [employer]  at [phone].  Representative will fax refill form to [place] office, attention to [place] today.  Called patient to let her know that form will be sent out as soon as provider has signed the refill form. It will take 7 business days for delivery. Left message for patient to return call.  [place] [phone]

## 2022-01-20 ENCOUNTER — Other Ambulatory Visit: Payer: Self-pay | Admitting: Cardiovascular Disease

## 2022-01-25 ENCOUNTER — Telehealth: Payer: Self-pay | Admitting: Cardiovascular Disease

## 2022-01-25 ENCOUNTER — Other Ambulatory Visit: Payer: Self-pay | Admitting: Cardiovascular Disease

## 2022-01-25 DIAGNOSIS — E1165 Type 2 diabetes mellitus with hyperglycemia: Secondary | ICD-10-CM

## 2022-01-25 MED ORDER — LISINOPRIL 2.5 MG PO TABS: 2.5000 mg | ORAL_TABLET | Freq: Every day | ORAL | 0 refills | Status: DC

## 2022-01-25 NOTE — Telephone Encounter (Signed)
*  STAT* If patient is at the pharmacy, call can be transferred to refill team.   1. Which medications need to be refilled? (please list name of each medication and dose if known) ezetimibe (ZETIA) 10 MG tablet  2. Which pharmacy/location (including street and city if local pharmacy) is medication to be sent to? [place] (N), [place] [address]  3. Do they need a 30 day or 90 day supply? [place]

## 2022-02-05 ENCOUNTER — Telehealth: Payer: Self-pay

## 2022-02-05 ENCOUNTER — Ambulatory Visit (INDEPENDENT_AMBULATORY_CARE_PROVIDER_SITE_OTHER): Payer: Medicare HMO

## 2022-02-05 NOTE — Patient Instructions (Signed)
Visit Information   Goals Addressed   None    Patient Care Plan: CCM Pharmacy Care Plan     Problem Identified: HLD Tobacco use T2DM PAD GAD   Priority: High     Long-Range Goal: Disease managment   Start Date: 11/22/2020  Expected End Date: 11/22/2021  Recent Progress: On track  Priority: High  Note:   Current Barriers:  Unable to maintain control of T2DM and tobacco use - now at goal  Pharmacist Clinical Goal(s):  Patient will verbalize ability to afford treatment regimen contact provider office for questions/concerns as evidenced notation of same in electronic health record through collaboration with PharmD and provider.   Interventions: 1:1 collaboration with [name], NP regarding development and update of comprehensive plan of care as evidenced by provider attestation and co-signature Inter-disciplinary care team collaboration (see longitudinal plan of care) Comprehensive medication review performed; medication list updated in electronic medical record  Hypertension (BP goal <140/90) BP Readings from Last 3 Encounters:  10/16/21 114/73  09/21/21 114/66  09/08/21 110/63  -Controlled -Current treatment: Lisinopril 2.44m Appropriate, Effective, Safe, Accessible -Medications previously tried: N/A  -Current home readings: doesn't test -Current dietary habits: "Tries to eat healthy" -Current exercise habits: N/A -Denies hypotensive/hypertensive symptoms -Educated on BP goals and benefits of medications for prevention of heart attack, stroke and kidney damage; -Counseled to monitor BP at home weekly, document, and provide log at future appointments -Recommended to continue current medication   Diabetes (A1c goal <7%) Lab Results  Component Value Date   HGBA1C 6.7 (H) 10/16/2021   HGBA1C 6.8 (H) 05/10/2021   HGBA1C 6.7 (H) 01/17/2021   Lab Results  Component Value Date   MICROALBUR 10 07/01/2020   LDLCALC 44 10/16/2021   CREATININE 0.60 10/16/2021    Lab Results  Component Value Date   NA 135 10/16/2021   K 4.5 10/16/2021   CREATININE 0.60 10/16/2021   EGFR 97 10/16/2021   GFRNONAA >60 09/01/2021   GLUCOSE 109 (H) 10/16/2021   Lab Results  Component Value Date   WBC 8.2 10/16/2021   HGB 13.3 10/16/2021   HCT 40.0 10/16/2021   MCV 93 10/16/2021   PLT 317 10/16/2021   Lab Results  Component Value Date   LABMICR See below: 10/16/2021   LABMICR See below: 08/29/2020   MICROALBUR 10 07/01/2020  -Controlled -Current medications: Metformin 1000 mg twice daily with meals Appropriate, Effective, Safe, Accessible Ozempic 0.5 mg once weekly Appropriate, Effective, Safe, Accessible -Medications previously tried: n/a  -Kidney protection - on lisinopril 2.5 mg  -Current home glucose readings fasting glucose:  October 2023: Didn't have list with her, was at daughter's house this morning - had been off of ozempic due to cost however has now started back on samples. -Denies hypoglycemic/hyperglycemic symptoms -Current meal patterns:  breakfast: rye bread with peanut butter.  lunch: ham and cheese sandwich  dinner: shrimp, hush puppies snacks: nuts, crackers and cheese drinks: water and coffee -Exercise: Trying to walk more, in the past limited by pain. Would like to find a treadmill for home use, does not really want to go to gym through silver sneakers. -Educated on A1c and blood sugar goals; Benefits of routine self-monitoring of blood sugar; October 2023: Patient still hasn't heard about Ozempic PAP. Will have team look into this  CAD, PAD, cholesterol: (LDL goal < 70) The ASCVD Risk score (Arnett DK, et al., 2019) failed to calculate for the following reasons:   The valid total cholesterol range is 130 to  320 mg/dL Lab Results  Component Value Date   CHOL 114 10/16/2021   CHOL 128 05/10/2021   CHOL 103 01/17/2021   Lab Results  Component Value Date   HDL 56 10/16/2021   HDL 61 05/10/2021   HDL 50 01/17/2021   Lab  Results  Component Value Date   LDLCALC 44 10/16/2021   LDLCALC 48 05/10/2021   LDLCALC 35 01/17/2021   Lab Results  Component Value Date   TRIG 63 10/16/2021   TRIG 102 05/10/2021   TRIG 95 01/17/2021   Lab Results  Component Value Date   CHOLHDL 2.0 10/16/2021   CHOLHDL 2.1 05/10/2021   CHOLHDL 2.1 01/17/2021  No results found for: "LDLDIRECT" Last vitamin D No results found for: "25OHVITD2", "25OHVITD3", "VD25OH" Lab Results  Component Value Date   TSH 0.579 10/16/2021  -Controlled -Current treatment: Rosuvastatin 20 mg once daily Appropriate, Effective, Safe, Accessible Zetia 10 mg daily Appropriate, Effective, Safe, Accessible Cilostazol 112m BID Appropriate, Effective, Safe, Accessible -Medications previously tried: n/a  -Current dietary patterns: see DM -Current exercise habits: see DM -Educated on Cholesterol goals;  Benefits of statin for ASCVD risk reduction; -Counseled on diet and exercise extensively Recommended to continue current medication  Patient Goals/Self-Care Activities Patient will:  - take medications as prescribed target a minimum of 150 minutes of moderate intensity exercise weekly  Follow Up Plan: CPA DM call 3 months. CPP f/u telephone visit 6 months  Patient's preferred pharmacy is:     Ms. DCieslikwas given information about Chronic Care Management services today including:  CCM service includes personalized support from designated clinical staff supervised by her physician, including individualized plan of care and coordination with other care providers 24/7 contact phone numbers for assistance for urgent and routine care needs. Standard insurance, coinsurance, copays and deductibles apply for chronic care management only during months in which we provide at least 20 minutes of these services. Most insurances cover these services at 100%, however patients may be responsible for any copay, coinsurance and/or deductible if applicable. This  service may help you avoid the need for more expensive face-to-face services. Only one practitioner may furnish and bill the service in a calendar month. The patient may stop CCM services at any time (effective at the end of the month) by phone call to the office staff.  Patient agreed to services and verbal consent obtained.   The patient verbalized understanding of instructions, educational materials, and care plan provided today and DECLINED offer to receive copy of patient instructions, educational materials, and care plan.  The pharmacy team will reach out to the patient again over the next 60 days.   NLane Hacker RDe Soto

## 2022-02-05 NOTE — Progress Notes (Signed)
Finished onboard to [employer]

## 2022-02-05 NOTE — Progress Notes (Signed)
Chronic Care Management Pharmacy Note  02/05/2022 Name:  Tammy Guerra MRN:  [id number] DOB:  [birth year]  Plan: [facility]board to [facility] hasn't gotten Ozempic PAP in mail. Will look into this  Subjective: Tammy Guerra is an 71 y.o. year old female who is a primary patient of [name], NP.  The CCM team was consulted for assistance with disease management and care coordination needs.    Engaged with patient by telephone for follow up visit in response to provider referral for pharmacy case management and/or care coordination services.   Consent to Services:  The patient was given information about Chronic Care Management services, agreed to services, and gave verbal consent prior to initiation of services.  Please see initial visit note for detailed documentation.   Patient Care Team: [name], NP as PCP - General (Nurse Practitioner) [name], MD as PCP - Cardiology (Cardiology) Lane Hacker, [facility] (Pharmacist)  Hospital visits: None in previous 6 months  Objective:  Lab Results  Component Value Date   CREATININE 0.60 10/16/2021   CREATININE 0.88 09/01/2021   CREATININE 0.57 08/30/2021   Lab Results  Component Value Date   HGBA1C 6.7 (H) 10/16/2021  Last diabetic Eye exam:  Lab Results  Component Value Date/Time   HMDIABEYEEXA No Retinopathy 02/08/2021 12:00 AM   Last diabetic Foot exam: No results found for: "HMDIABFOOTEX"      Component Value Date/Time   CHOL 114 10/16/2021 1031   TRIG 63 10/16/2021 1031   HDL 56 10/16/2021 1031   CHOLHDL 2.0 10/16/2021 1031   LDLCALC 44 10/16/2021 1031       Latest Ref Rng & Units 10/16/2021   10:31 AM 05/10/2021    9:20 AM 01/17/2021    9:13 AM  Hepatic Function  Total Protein 6.0 - 8.5 g/dL 6.5  6.4  6.2   Albumin 3.8 - 4.8 g/dL 4.7  4.4  4.3   AST 0 - 40 IU/L $Remov'19  16  17   'OvrlcO$ ALT 0 - 32 IU/L $Remov'24  22  26   'mzdfCX$ Alk Phosphatase 44 - 121 IU/L 71  78  81   Total Bilirubin 0.0 - 1.2 mg/dL 0.3   0.3  0.3     Lab Results  Component Value Date/Time   TSH 0.579 10/16/2021 10:31 AM       Latest Ref Rng & Units 10/16/2021   10:31 AM 09/01/2021   12:03 PM 08/30/2021   12:14 PM  CBC  WBC 3.4 - 10.8 x10E3/uL 8.2  10.1  7.6   Hemoglobin 11.1 - 15.9 g/dL 13.3  13.9  13.3   Hematocrit 34.0 - 46.6 % 40.0  41.5  39.4   Platelets 150 - 450 x10E3/uL 317  348  317     No results found for: "VD25OH"  Clinical ASCVD:  The ASCVD Risk score (Arnett DK, et al., 2019) failed to calculate for the following reasons:   The valid total cholesterol range is 130 to 320 mg/dL    Social History   Tobacco Use  Smoking Status Former   Packs/day: 0.50   Years: 41.00   Total pack years: 20.50   Types: Cigarettes   Quit date: 12/19/2020   Years since quitting: 1.1  Smokeless Tobacco Never  Tobacco Comments   15 CIG DAILY   BP Readings from Last 3 Encounters:  10/16/21 114/73  09/21/21 114/66  09/08/21 110/63   Pulse Readings from Last 3 Encounters:  10/16/21 81  09/21/21  67  09/08/21 65   Wt Readings from Last 3 Encounters:  11/27/21 190 lb (86.2 kg)  10/16/21 196 lb 12.8 oz (89.3 kg)  09/21/21 198 lb (89.8 kg)   Assessment: Review of patient past medical history, allergies, medications, health status, including review of consultants reports, laboratory and other test data, was performed as part of comprehensive evaluation and provision of chronic care management services.   SDOH:  (Social Determinants of Health) assessments and interventions performed: Yes SDOH Interventions    Flowsheet Row Chronic Care Management from 02/05/2022 in [place] from 08/09/2021 in [place] Management from 06/05/2021 in [place] from 08/20/2019 in Home Interventions -- Intervention Not Indicated Intervention Not Indicated --  Housing Interventions --  Intervention Not Indicated -- --  Transportation Interventions Intervention Not Indicated Intervention Not Indicated Intervention Not Indicated --  Depression Interventions/Treatment  -- -- -- Medication  [Voice mail left for referral to [facility] at 10:15 am.]  Financial Strain Interventions Other (Comment)  [PAP] Intervention Not Indicated -- --  Physical Activity Interventions -- Intervention Not Indicated -- --  Stress Interventions -- Intervention Not Indicated -- --  Social Connections Interventions -- Intervention Not Indicated -- --       [place]  No Known Allergies  Medications Reviewed Today     Reviewed by Lane Hacker, [facility] (Pharmacist) on 02/05/22 at [phone]  Med List Status: <None>   Medication Order Taking? Sig Documenting Provider Last Dose Status Informant  ALPRAZolam (XANAX) 0.25 MG tablet [id number]  TAKE 1 TABLET BY MOUTH AS NEEDED FOR ANXIETY. [name], NP  Active   Blood Glucose Monitoring Suppl (ONE TOUCH ULTRA 2) w/Device KIT [id number]  1 each by Does not apply route daily. [name], NP  Active   cilostazol (PLETAL) 100 MG tablet [id number]  Take 1 tablet by mouth twice daily [name], MD  Active   ezetimibe (ZETIA) 10 MG tablet [id number]  Take 1 tablet by mouth once daily [name], MD  Active   glucose blood (ONETOUCH ULTRA) test strip [id number]  TEST BLOOD SUGAR EVERY DAY [name], NP  Active   hydrocortisone (ANUSOL-HC) 2.5 % rectal cream [id number] No Apply topically 2 (two) times daily. [provider] Taking Active   KETOCONAZOLE, TOPICAL, 1 % SHAM [id number] No Apply 1 application topically 2 (two) times a week.  Patient not taking: Reported on 10/16/2021   [name], NP Not Taking Active   lisinopril (ZESTRIL) 2.5 MG tablet [id number]  Take 1 tablet (2.5 mg total) by mouth daily. [name], MD  Active   meloxicam (MOBIC) 7.5 MG tablet [id number] No Take 7.5 mg by mouth daily. [provider] Taking Active   metFORMIN (GLUCOPHAGE-XR) 500 MG 24 hr tablet [id number] No TAKE 2 TABLETS BY MOUTH DAILY WITH DINNER. [name], NP Taking Active   pantoprazole (PROTONIX) 40 MG tablet [id number] No Take 1 tablet (40 mg total) by mouth daily. Lavonia Drafts, MD Taking Active   rosuvastatin (CRESTOR) 20 MG tablet [id number]  Take 1 tablet by mouth once daily [name], NP  Active   Semaglutide,0.25 or 0.5MG /DOS, (OZEMPIC, 0.25 OR 0.5 MG/DOSE,) 2 MG/1.5ML SOPN [id number] No Inject 0.5 mg into the skin once a week. Start with 0.25MG  once a week x 4 weeks, then increase to 0.5MG  weekly. [name], NP Taking Active  traZODone (DESYREL) 50 MG tablet [id number] No TAKE 1 TABLET (50 MG TOTAL) BY MOUTH AT BEDTIME AS NEEDED FOR SLEEP (CAN TAKE 1-2 TABS AS NEEDED ). [name], NP Taking Active             Patient Active Problem List   Diagnosis Date Noted   Accelerating angina [facility])    Emphysema lung ([place]) 07/01/2020   Coronary artery disease 07/01/2020   Uncontrolled type 2 diabetes mellitus with hyperglycemia ([place]) 07/01/2020   Insomnia 07/01/2020   Pancreas cyst 06/27/2020   Diabetes mellitus type 2 with complications ([place]) 82/51/8984   Complex renal cyst 11/03/2018   Aortic atherosclerosis ([place]) 08/26/2018   PAD (peripheral artery disease) ([place]) 10/28/2017   Tobacco use disorder 09/24/2017   GERD (gastroesophageal reflux disease) 09/24/2017   Myalgia due to statin 09/10/2017   Lumbar spondylosis 09/10/2017   Primary osteoarthritis of both knees 09/10/2017   Trigger middle finger of right hand 09/10/2017   Lumbar pain with radiation down both legs 04/25/2016   Health care maintenance 01/05/2015   Nephrolithiasis 10/05/2014   Generalized anxiety disorder 07/05/2014    Immunization History  Administered Date(s) Administered   PFIZER(Purple Top)SARS-COV-2 Vaccination 06/18/2019, 07/09/2019   DEXA 10/2020 - normal   Conditions to be  addressed/monitored: T2DM, HLD, CAD, PAD, Insomnia, Tobacco use, GERD  Care Plan : [place]  Updates made by Lane Hacker, [place] since 02/05/2022 12:00 AM     Problem: HLD Tobacco use T2DM PAD GAD   Priority: High     Long-Range Goal: Disease managment   Start Date: 11/22/2020  Expected End Date: 11/22/2021  Recent Progress: On track  Priority: High  Note:   Current Barriers:  Unable to maintain control of T2DM and tobacco use - now at goal  Pharmacist Clinical Goal(s):  Patient will verbalize ability to afford treatment regimen contact provider office for questions/concerns as evidenced notation of same in electronic health record through collaboration with PharmD and provider.   Interventions: 1:1 collaboration with [name], NP regarding development and update of comprehensive plan of care as evidenced by provider attestation and co-signature Inter-disciplinary care team collaboration (see longitudinal plan of care) Comprehensive medication review performed; medication list updated in electronic medical record  Hypertension (BP goal <140/90) BP Readings from Last 3 Encounters:  10/16/21 114/73  09/21/21 114/66  09/08/21 110/63  -Controlled -Current treatment: Lisinopril 2.5mg  Appropriate, Effective, Safe, Accessible -Medications previously tried: N/A  -Current home readings: doesn't test -Current dietary habits: "Tries to eat healthy" -Current exercise habits: N/A -Denies hypotensive/hypertensive symptoms -Educated on BP goals and benefits of medications for prevention of heart attack, stroke and kidney damage; -Counseled to monitor BP at home weekly, document, and provide log at future appointments -Recommended to continue current medication   Diabetes (A1c goal <7%) Lab Results  Component Value Date   HGBA1C 6.7 (H) 10/16/2021   HGBA1C 6.8 (H) 05/10/2021   HGBA1C 6.7 (H) 01/17/2021   Lab Results  Component Value Date   MICROALBUR 10  07/01/2020   LDLCALC 44 10/16/2021   CREATININE 0.60 10/16/2021   Lab Results  Component Value Date   NA 135 10/16/2021   K 4.5 10/16/2021   CREATININE 0.60 10/16/2021   EGFR 97 10/16/2021   GFRNONAA >60 09/01/2021   GLUCOSE 109 (H) 10/16/2021   Lab Results  Component Value Date   WBC 8.2 10/16/2021   HGB 13.3 10/16/2021   HCT 40.0 10/16/2021   MCV 93 10/16/2021   PLT  317 10/16/2021   Lab Results  Component Value Date   LABMICR See below: 10/16/2021   LABMICR See below: 08/29/2020   MICROALBUR 10 07/01/2020  -Controlled -Current medications: Metformin 1000 mg twice daily with meals Appropriate, Effective, Safe, Accessible Ozempic 0.5 mg once weekly Appropriate, Effective, Safe, Accessible -Medications previously tried: n/a  -Kidney protection - on lisinopril 2.5 mg  -Current home glucose readings fasting glucose:  October 2023: Didn't have list with her, was at daughter's house this morning - had been off of ozempic due to cost however has now started back on samples. -Denies hypoglycemic/hyperglycemic symptoms -Current meal patterns:  breakfast: rye bread with peanut butter.  lunch: ham and cheese sandwich  dinner: shrimp, hush puppies snacks: nuts, crackers and cheese drinks: water and coffee -Exercise: Trying to walk more, in the past limited by pain. Would like to find a treadmill for home use, does not really want to go to gym through silver sneakers. -Educated on A1c and blood sugar goals; Benefits of routine self-monitoring of blood sugar; October 2023: Patient still hasn't heard about Ozempic PAP. Will have team look into this  CAD, PAD, cholesterol: (LDL goal < 70) The ASCVD Risk score (Arnett DK, et al., 2019) failed to calculate for the following reasons:   The valid total cholesterol range is 130 to 320 mg/dL Lab Results  Component Value Date   CHOL 114 10/16/2021   CHOL 128 05/10/2021   CHOL 103 01/17/2021   Lab Results  Component Value Date    HDL 56 10/16/2021   HDL 61 05/10/2021   HDL 50 01/17/2021   Lab Results  Component Value Date   LDLCALC 44 10/16/2021   LDLCALC 48 05/10/2021   LDLCALC 35 01/17/2021   Lab Results  Component Value Date   TRIG 63 10/16/2021   TRIG 102 05/10/2021   TRIG 95 01/17/2021   Lab Results  Component Value Date   CHOLHDL 2.0 10/16/2021   CHOLHDL 2.1 05/10/2021   CHOLHDL 2.1 01/17/2021  No results found for: "LDLDIRECT" Last vitamin D No results found for: "25OHVITD2", "25OHVITD3", "VD25OH" Lab Results  Component Value Date   TSH 0.579 10/16/2021  -Controlled -Current treatment: Rosuvastatin 20 mg once daily Appropriate, Effective, Safe, Accessible Zetia 10 mg daily Appropriate, Effective, Safe, Accessible Cilostazol 100mg  BID Appropriate, Effective, Safe, Accessible -Medications previously tried: n/a  -Current dietary patterns: see DM -Current exercise habits: see DM -Educated on Cholesterol goals;  Benefits of statin for ASCVD risk reduction; -Counseled on diet and exercise extensively Recommended to continue current medication  Patient Goals/Self-Care Activities Patient will:  - take medications as prescribed target a minimum of 150 minutes of moderate intensity exercise weekly  Follow Up Plan: CPA DM call 3 months. CPP f/u telephone visit 6 months  Patient's preferred pharmacy is:    [facility] [address] ([address]), [place] ([place]) [place] [address] Phone: [phone] Fax: [phone]  -Verbal consent obtained for [facility] enhanced pharmacy services (medication synchronization, adherence packaging, delivery coordination). A medication sync plan was created to allow patient to get all medications delivered once every 30 to 90 days per patient preference. Patient understands they have freedom to choose pharmacy and clinical pharmacist will coordinate care between all prescribers and [facility]. -Patient would like all meds due on same day  Future Appointments  Date Time Provider [place]  02/22/2022  9:00 AM [name], MD [facility] None   [place] Constable, Pharm.D. -  [phone]     

## 2022-02-05 NOTE — Progress Notes (Signed)
    Chronic Care Management Pharmacy Assistant   Name: Tammy Guerra  MRN: [id number] DOB: [birth year]    Reason for Encounter: Enrolling patient for Dispensing servises    Medications: Outpatient Encounter Medications as of 02/05/2022  Medication Sig   ALPRAZolam (XANAX) 0.25 MG tablet TAKE 1 TABLET BY MOUTH AS NEEDED FOR ANXIETY.   Blood Glucose Monitoring Suppl (ONE TOUCH ULTRA 2) w/Device KIT 1 each by Does not apply route daily.   cilostazol (PLETAL) 100 MG tablet Take 1 tablet by mouth twice daily   ezetimibe (ZETIA) 10 MG tablet Take 1 tablet by mouth once daily   glucose blood (ONETOUCH ULTRA) test strip TEST BLOOD SUGAR EVERY DAY   hydrocortisone (ANUSOL-HC) 2.5 % rectal cream Apply topically 2 (two) times daily.   KETOCONAZOLE, TOPICAL, 1 % SHAM Apply 1 application topically 2 (two) times a week. (Patient not taking: Reported on 10/16/2021)   lisinopril (ZESTRIL) 2.5 MG tablet Take 1 tablet (2.5 mg total) by mouth daily.   meloxicam (MOBIC) 7.5 MG tablet Take 7.5 mg by mouth daily.   metFORMIN (GLUCOPHAGE-XR) 500 MG 24 hr tablet TAKE 2 TABLETS BY MOUTH DAILY WITH DINNER.   pantoprazole (PROTONIX) 40 MG tablet Take 1 tablet (40 mg total) by mouth daily.   rosuvastatin (CRESTOR) 20 MG tablet Take 1 tablet by mouth once daily   Semaglutide,0.25 or 0.5MG /DOS, (OZEMPIC, 0.25 OR 0.5 MG/DOSE,) 2 MG/1.5ML SOPN Inject 0.5 mg into the skin once a week. Start with 0.25MG  once a week x 4 weeks, then increase to 0.5MG  weekly.   traZODone (DESYREL) 50 MG tablet TAKE 1 TABLET (50 MG TOTAL) BY MOUTH AT BEDTIME AS NEEDED FOR SLEEP (CAN TAKE 1-2 TABS AS NEEDED ).   No facility-administered encounter medications on file as of 02/05/2022.   Follow-up:Pharmacist Review  The patient had an initial televisit with Clinical Pharmacist Arizona Constable on 02/05/22. Upon completion of the visit the patient  has agreed to try [facility] services for their dispensing and delivery of medications.  Per clinical pharmacist request I completed an on-boarding form with the list of the patients medications, current pharmacy, demographics, allergies and insurance information. The form was then forward to Pharmacy for review.  [place] [phone]

## 2022-02-12 DIAGNOSIS — E119 Type 2 diabetes mellitus without complications: Secondary | ICD-10-CM | POA: Diagnosis not present

## 2022-02-12 LAB — HM DIABETES EYE EXAM

## 2022-02-14 NOTE — Progress Notes (Deleted)
There were no vitals taken for this visit.   Subjective:    Patient ID: Tammy Guerra, female    DOB: [birth year], 71 y.o.   MRN: [id number]  HPI: Tammy Guerra is a 71 y.o. female  No chief complaint on file.  COPD Stopped smoking 12/23/20. COPD status: controlled Satisfied with current treatment?: yes Oxygen use: no Dyspnea frequency:  not really Cough frequency: no Rescue inhaler frequency:   Limitation of activity: no Productive cough: no Last Spirometry:  Pneumovax: Up to Date Influenza: Not up to Date  DIABETES Hypoglycemic episodes:no Polydipsia/polyuria: no Visual disturbance: no Chest pain: no Paresthesias: no Glucose Monitoring: no  Accucheck frequency: Daily  Fasting glucose: 117-120  Post prandial:  Evening:  Before meals: Taking Insulin?: no  Long acting insulin:  Short acting insulin: Blood Pressure Monitoring: not checking Retinal Examination: Up to Date Foot Exam: Not up to Date Diabetic Education: Not Completed Pneumovax: Not up to Date Influenza: Not up to Date Aspirin: no  ANXIETY Patient states her anxiety has been about the same.  She rarely uses the xanax.  But having them on hand if she does need them decreases her anxiety. She is still doing well with the trazodone at bedtime.     Denies HA, CP, SOB, palpitations, visual changes, and lower extremity swelling.  SKIN Patient states her hair is getting thinner and thinner. She feels like she has scabs in her hair.  Feels like it is dry patches.   DIZZINESS Patient states she has noticed dizziness since she started the Cilostazol.  She was put on it by her Cardiologist for the blockages in her legs.  She is also smelly smoke everyday for the last 3 weeks.  She also has phlegm in her throat.    Relevant past medical, surgical, family and social history reviewed and updated as indicated. Interim medical history since our last visit reviewed. Allergies and medications reviewed and  updated.  Review of Systems  HENT:  Positive for congestion.        Smelling smoke   Eyes:  Negative for visual disturbance.  Respiratory:  Negative for cough, chest tightness and shortness of breath.   Cardiovascular:  Negative for chest pain, palpitations and leg swelling.  Neurological:  Positive for dizziness. Negative for headaches.   Per HPI unless specifically indicated above     Objective:    There were no vitals taken for this visit.  Wt Readings from Last 3 Encounters:  11/27/21 190 lb (86.2 kg)  10/16/21 196 lb 12.8 oz (89.3 kg)  09/21/21 198 lb (89.8 kg)    Physical Exam Vitals and nursing note reviewed.  Constitutional:      General: She is not in acute distress.    Appearance: Normal appearance. She is normal weight. She is not ill-appearing, toxic-appearing or diaphoretic.  HENT:     Head: Normocephalic.     Right Ear: External ear normal. A middle ear effusion is present.     Left Ear: External ear normal. A middle ear effusion is present.     Nose: Nose normal.     Mouth/Throat:     Mouth: Mucous membranes are moist.     Pharynx: Oropharynx is clear.  Eyes:     General:        Right eye: No discharge.        Left eye: No discharge.     Extraocular Movements: Extraocular movements intact.     Conjunctiva/sclera: Conjunctivae normal.  Pupils: Pupils are equal, round, and reactive to light.  Cardiovascular:     Rate and Rhythm: Normal rate and regular rhythm.     Heart sounds: No murmur heard. Pulmonary:     Effort: Pulmonary effort is normal. No respiratory distress.     Breath sounds: Normal breath sounds. No wheezing or rales.  Musculoskeletal:     Cervical back: Normal range of motion and neck supple.  Skin:    General: Skin is warm and dry.     Capillary Refill: Capillary refill takes less than 2 seconds.  Neurological:     General: No focal deficit present.     Mental Status: She is alert and oriented to person, place, and time. Mental  status is at baseline.  Psychiatric:        Mood and Affect: Mood normal.        Behavior: Behavior normal.        Thought Content: Thought content normal.        Judgment: Judgment normal.    Results for orders placed or performed in visit on 11/07/21  HM MAMMOGRAPHY  Result Value Ref Range   HM Mammogram 0-4 Bi-Rad 0-4 Bi-Rad, Self Reported Normal      Assessment & Plan:   Problem List Items Addressed This Visit      Cardiovascular and Mediastinum   PAD (peripheral artery disease) ([place])   Aortic atherosclerosis ([place]) - Primary   Coronary artery disease     Respiratory   Emphysema lung ([place])     Endocrine   Uncontrolled type 2 diabetes mellitus with hyperglycemia ([place])     Other   Generalized anxiety disorder     Follow up plan: No follow-ups on file.

## 2022-02-15 ENCOUNTER — Ambulatory Visit: Payer: Medicare HMO | Admitting: Nurse Practitioner

## 2022-02-15 ENCOUNTER — Telehealth: Payer: Self-pay | Admitting: Nurse Practitioner

## 2022-02-15 DIAGNOSIS — E1165 Type 2 diabetes mellitus with hyperglycemia: Secondary | ICD-10-CM

## 2022-02-15 DIAGNOSIS — I700 Atherosclerosis of aorta: Secondary | ICD-10-CM

## 2022-02-15 DIAGNOSIS — J439 Emphysema, unspecified: Secondary | ICD-10-CM

## 2022-02-15 DIAGNOSIS — I739 Peripheral vascular disease, unspecified: Secondary | ICD-10-CM

## 2022-02-15 DIAGNOSIS — F411 Generalized anxiety disorder: Secondary | ICD-10-CM

## 2022-02-15 DIAGNOSIS — I25118 Atherosclerotic heart disease of native coronary artery with other forms of angina pectoris: Secondary | ICD-10-CM

## 2022-02-15 NOTE — Telephone Encounter (Signed)
Call patient to inform that Provider was out sick. Patient stated that she is out of ozempic. Please advise

## 2022-02-16 ENCOUNTER — Other Ambulatory Visit: Payer: Self-pay | Admitting: Cardiovascular Disease

## 2022-02-16 NOTE — Telephone Encounter (Signed)
Called patient to confirm if she was requesting ozempic. Patient states she does not need medication refill for ozempic at this time. No further action needed.

## 2022-02-19 ENCOUNTER — Encounter (INDEPENDENT_AMBULATORY_CARE_PROVIDER_SITE_OTHER): Payer: Self-pay

## 2022-02-20 ENCOUNTER — Telehealth: Payer: Self-pay | Admitting: Nurse Practitioner

## 2022-02-20 DIAGNOSIS — E1159 Type 2 diabetes mellitus with other circulatory complications: Secondary | ICD-10-CM

## 2022-02-20 DIAGNOSIS — Z7985 Long-term (current) use of injectable non-insulin antidiabetic drugs: Secondary | ICD-10-CM | POA: Diagnosis not present

## 2022-02-20 DIAGNOSIS — I2510 Atherosclerotic heart disease of native coronary artery without angina pectoris: Secondary | ICD-10-CM | POA: Diagnosis not present

## 2022-02-20 DIAGNOSIS — I10 Essential (primary) hypertension: Secondary | ICD-10-CM

## 2022-02-20 MED ORDER — METFORMIN HCL ER 500 MG PO TB24: ORAL_TABLET | ORAL | 1 refills | Status: DC

## 2022-02-20 MED ORDER — TRAZODONE HCL 50 MG PO TABS: ORAL_TABLET | ORAL | 1 refills | Status: DC

## 2022-02-20 MED ORDER — PANTOPRAZOLE SODIUM 40 MG PO TBEC: 40.0000 mg | DELAYED_RELEASE_TABLET | Freq: Every day | ORAL | 1 refills | Status: DC

## 2022-02-20 NOTE — Telephone Encounter (Signed)
Medications sent to the pharmacy.

## 2022-02-20 NOTE — Telephone Encounter (Signed)
-----   Message from Georgina Peer, [place] sent at 02/20/2022 11:57 AM EDT ----- Regarding: FW: Rx refill  ----- Message ----- From: Garth Bigness Sent: 02/20/2022  11:50 AM EDT To: Georgina Peer, CMA Subject: Rx refill                                      Hello,   I received a message from [facility] that a refill for Tammy Guerra for her Metformin '500mg'$ , Pantoprazole '40mg'$ , and her Trazodone '50mg'$  sent in for a upcoming delivery this week, thank you in advance.   [place], PTM

## 2022-02-21 ENCOUNTER — Encounter: Payer: Self-pay | Admitting: Nurse Practitioner

## 2022-02-21 ENCOUNTER — Telehealth: Payer: Self-pay

## 2022-02-21 NOTE — Telephone Encounter (Signed)
Patient has picked up 1 box of Ozempic Sample.

## 2022-02-22 ENCOUNTER — Ambulatory Visit: Payer: Medicare HMO | Attending: Cardiovascular Disease | Admitting: Cardiovascular Disease

## 2022-02-22 ENCOUNTER — Encounter: Payer: Self-pay | Admitting: Cardiovascular Disease

## 2022-02-22 VITALS — BP 110/66 | HR 79 | Ht 66.0 in | Wt 200.0 lb

## 2022-02-22 DIAGNOSIS — I739 Peripheral vascular disease, unspecified: Secondary | ICD-10-CM

## 2022-02-22 DIAGNOSIS — I2510 Atherosclerotic heart disease of native coronary artery without angina pectoris: Secondary | ICD-10-CM | POA: Diagnosis not present

## 2022-02-22 DIAGNOSIS — E785 Hyperlipidemia, unspecified: Secondary | ICD-10-CM

## 2022-02-22 NOTE — Progress Notes (Signed)
Cardiology Office Note   Date:  02/22/2022   ID:  Tammy Guerra, DOB 1951/03/30, MRN [id number]  PCP:  [name], NP  Cardiologist:  Dr. Fletcher Anon  Chief Complaint  Patient presents with   Follow-up    Patient reports swelling/pain in her right foot. "I can't bend it and it feels like it locks up."       History of Present Illness: Tammy Guerra is a 71 y.o. female who presents for a follow-up visit regarding peripheral arterial disease and coronary artery disease.   She has past medical history of pancreatic cyst, anxiety, depression, hyperlipidemia, diabetes mellitus, tobacco use and aortic and coronary calcifications. She has known history of peripheral arterial disease with bilateral calf claudication. Noninvasive vascular studies showed an ABI of 0.92 on the right and 0.63 on the left.  Duplex showed severe stenosis of the right popliteal artery with chronically occluded left SFA throughout its whole length She was started on cilostazol and she quit smoking.  She had subsequent improvement in symptoms. She had atypical chest pain and shortness of breath and had an abnormal cardiac CTA in May of this year.  Thus, coronary angiography was performed in May which showed mild one-vessel coronary artery disease involving the LAD which was moderately calcified.  EF was normal with mildly elevated left ventricular end-diastolic pressure.  I recommended medical therapy.  She has been doing reasonably well overall with no chest pain or worsening dyspnea.  She does complain of weight gain after she is quit smoking.  In addition, she is bothered by right knee discomfort and limited range of motion with walking.  She has no calf discomfort.  She is concerned that this might be related to PAD.   Past Medical History:  Diagnosis Date   Anxiety    Arthritis    Cancer ([place])    skin/CERVICAL CA   Complication of anesthesia    discomfort during first cataract   Diabetes mellitus  without complication (HCC)    Emphysema lung ([place]) 07/01/2020   GERD (gastroesophageal reflux disease)    NO MEDS   History of kidney stones    STONES AND CYSTS   Kidney stone    Palpitations    Pancreatic cyst     Past Surgical History:  Procedure Laterality Date   CARDIAC CATHETERIZATION     CATARACT EXTRACTION W/PHACO Left 09/07/2014   Procedure: CATARACT EXTRACTION PHACO AND INTRAOCULAR LENS PLACEMENT ([place]);  Surgeon: Birder Robson, MD;  Location: [facility];  Service: Ophthalmology;  Laterality: Left;  [place] 01:03 AP% 27.7 CDE 17.55   CORONARY ANGIOPLASTY     EYE SURGERY     cataract   KNEE ARTHROSCOPY WITH MEDIAL MENISECTOMY Right 05/30/2017   Procedure: KNEE ARTHROSCOPY WITH MEDIAL AND LATERAL  MENISECTOMY;  Surgeon: Hessie Knows, MD;  Location: [facility];  Service: Orthopedics;  Laterality: Right;   LEFT HEART CATH AND CORONARY ANGIOGRAPHY N/A 09/08/2021   Procedure: LEFT HEART CATH AND CORONARY ANGIOGRAPHY;  Surgeon: [name], MD;  Location: [facility];  Service: Cardiovascular;  Laterality: N/A;   SYNOVECTOMY Right 05/30/2017   Procedure: SYNOVECTOMY;  Surgeon: Hessie Knows, MD;  Location: [facility];  Service: Orthopedics;  Laterality: Right;   TOTAL ABDOMINAL HYSTERECTOMY W/ BILATERAL SALPINGOOPHORECTOMY       Current Outpatient Medications  Medication Sig Dispense Refill   ALPRAZolam (XANAX) 0.25 MG tablet TAKE 1 TABLET BY MOUTH AS NEEDED FOR ANXIETY. 30 tablet 0   aspirin EC  81 MG tablet Take 81 mg by mouth daily. Swallow whole.     Blood Glucose Monitoring Suppl (ONE TOUCH ULTRA 2) w/Device KIT 1 each by Does not apply route daily. 1 kit 0   cilostazol (PLETAL) 100 MG tablet Take 1 tablet by mouth twice daily 180 tablet 0   ezetimibe (ZETIA) 10 MG tablet Take 1 tablet by mouth once daily 30 tablet 4   glucose blood (ONETOUCH ULTRA) test strip TEST BLOOD SUGAR EVERY DAY 100 strip 1   hydrocortisone (ANUSOL-HC) 2.5 % rectal cream Apply topically 2  (two) times daily.     lisinopril (ZESTRIL) 2.5 MG tablet Take 1 tablet (2.5 mg total) by mouth daily. 90 tablet 0   metFORMIN (GLUCOPHAGE-XR) 500 MG 24 hr tablet TAKE 2 TABLETS BY MOUTH DAILY WITH DINNER. 180 tablet 1   pantoprazole (PROTONIX) 40 MG tablet Take 1 tablet (40 mg total) by mouth daily. 90 tablet 1   rosuvastatin (CRESTOR) 20 MG tablet Take 1 tablet by mouth once daily 90 tablet 1   traZODone (DESYREL) 50 MG tablet TAKE 1 TABLET (50 MG TOTAL) BY MOUTH AT BEDTIME AS NEEDED FOR SLEEP (CAN TAKE 1-2 TABS AS NEEDED ). 180 tablet 1   meloxicam (MOBIC) 7.5 MG tablet Take 7.5 mg by mouth daily. (Patient not taking: Reported on 02/22/2022)     Semaglutide,0.25 or 0.5MG/DOS, (OZEMPIC, 0.25 OR 0.5 MG/DOSE,) 2 MG/1.5ML SOPN Inject 0.5 mg into the skin once a week. Start with 0.25MG once a week x 4 weeks, then increase to 0.5MG weekly. (Patient not taking: Reported on 02/22/2022) 6 mL 1   No current facility-administered medications for this visit.    Allergies:   Patient has no known allergies.    Social History:  The patient  reports that she quit smoking about 14 months ago. Her smoking use included cigarettes. She has a 20.50 pack-year smoking history. She has never used smokeless tobacco. She reports that she does not drink alcohol and does not use drugs.   Family History:  The patient's family history includes Alcohol abuse in her brother and father; Cancer in her brother and paternal aunt; Diabetes in her brother, brother, and father; Early death in her brother, brother, brother, and father; Heart disease in her brother and father; Stroke in her brother and father.    ROS:  Please see the history of present illness.   Otherwise, review of systems are positive for none.   All other systems are reviewed and negative.    PHYSICAL EXAM: VS:  BP 110/66 (BP Location: Right Arm, Patient Position: Sitting, Cuff Size: Large)   Pulse 79   Ht _0  (1.676 m)   Wt 200 lb (90.7 kg)   SpO2 98%    BMI 32.28 kg/m  , BMI Body mass index is 32.28 kg/m. GEN: Well nourished, well developed, in no acute distress  HEENT: normal  Neck: no JVD, carotid bruits, or masses Cardiac: RRR; no rubs, or gallops,no edema .  1 /6 systolic murmur in the aortic area Respiratory:  clear to auscultation bilaterally, normal work of breathing GI: soft, nontender, nondistended, + BS MS: no deformity or atrophy  Skin: warm and dry, no rash Neuro:  Strength and sensation are intact Psych: euthymic mood, full affect Vascular: Distal pulses are diminished but palpable on the right.  Not palpable on the left   EKG:  EKG is not ordered today.    Recent Labs: 10/16/2021: ALT 24; BUN 14; Creatinine, Ser 0.60; Hemoglobin  13.3; Platelets 317; Potassium 4.5; Sodium 135; TSH 0.579    Lipid Panel    Component Value Date/Time   CHOL 114 10/16/2021 1031   TRIG 63 10/16/2021 1031   HDL 56 10/16/2021 1031   CHOLHDL 2.0 10/16/2021 1031   LDLCALC 44 10/16/2021 1031      Wt Readings from Last 3 Encounters:  02/22/22 200 lb (90.7 kg)  11/27/21 190 lb (86.2 kg)  10/16/21 196 lb 12.8 oz (89.3 kg)          08/24/2020    8:44 AM  PAD Screen  Previous PAD dx? No  Previous surgical procedure? No  Pain with walking? Yes  Subsides with rest? Yes  Feet/toe relief with dangling? Yes  Painful, non-healing ulcers? No  Extremities discolored? No      ASSESSMENT AND PLAN:  1.  Peripheral arterial disease: Current discomfort in the right knee with limited range of motion is not related to peripheral arterial disease.  The patient has known severe arthritis in bilateral knees and I suspect that this is the likely etiology for her symptoms.  She does not have significant calf claudication at the present time.  Most recent vascular studies in May of this year showed mildly reduced ABI on the right side and moderately reduced on the left side.  And spite of ABI being worse on the left, she does not have significant  symptoms on that side.  I recommend continuing medical therapy.    2.  Coronary artery disease involving native coronary arteries without angina: Heavily calcified LAD with mild nonobstructive disease.  Continue medical therapy.  3.  Previous tobacco use: She quit smoking last year with no relapse.  4.  Hyperlipidemia: I reviewed most recent lipid profile done in June which showed an LDL of 44.  Continue treatment with rosuvastatin and ezetimibe.  5.  Right knee arthritis: If symptoms persist, consider referral to orthopedics.  The patient had prior surgery on her right knee in 2019.    Disposition:   FU with me in 6 months  Signed,  Kathlyn Sacramento, MD  02/22/2022 9:20 AM    [place]

## 2022-02-22 NOTE — Patient Instructions (Signed)
Medication Instructions:  Your physician recommends that you continue on your current medications as directed. Please refer to the Current Medication list given to you today.  *If you need a refill on your cardiac medications before your next appointment, please call your pharmacy*   Lab Work: None ordered If you have labs (blood work) drawn today and your tests are completely normal, you will receive your results only by: [place] (if you have MyChart) OR A paper copy in the mail If you have any lab test that is abnormal or we need to change your treatment, we will call you to review the results.   Follow-Up: At [facility], you and your health needs are our priority.  As part of our continuing mission to provide you with exceptional heart care, we have created designated Provider Care Teams.  These Care Teams include your primary Cardiologist (physician) and Advanced Practice Providers (APPs -  Physician Assistants and Nurse Practitioners) who all work together to provide you with the care you need, when you need it.  Your next appointment:   6 month(s)  The format for your next appointment:   In Person  Provider:   Kathlyn Sacramento, MD   Important Information About Sugar

## 2022-02-27 ENCOUNTER — Telehealth: Payer: Self-pay

## 2022-02-27 NOTE — Telephone Encounter (Signed)
2 boxes of Ozempic received for the patient. Called and LVM letting her know that it was ready to be picked up.

## 2022-02-28 NOTE — Telephone Encounter (Signed)
Medication picked up by the patient.  

## 2022-03-01 ENCOUNTER — Telehealth: Payer: Self-pay

## 2022-03-01 ENCOUNTER — Encounter: Payer: Self-pay | Admitting: Nurse Practitioner

## 2022-03-01 ENCOUNTER — Ambulatory Visit (INDEPENDENT_AMBULATORY_CARE_PROVIDER_SITE_OTHER): Payer: Medicare HMO | Admitting: Nurse Practitioner

## 2022-03-01 VITALS — BP 121/72 | HR 86 | Temp 98.2°F | Wt 197.9 lb

## 2022-03-01 DIAGNOSIS — M1711 Unilateral primary osteoarthritis, right knee: Secondary | ICD-10-CM | POA: Diagnosis not present

## 2022-03-01 DIAGNOSIS — Z1159 Encounter for screening for other viral diseases: Secondary | ICD-10-CM | POA: Diagnosis not present

## 2022-03-01 DIAGNOSIS — R8290 Unspecified abnormal findings in urine: Secondary | ICD-10-CM

## 2022-03-01 DIAGNOSIS — E118 Type 2 diabetes mellitus with unspecified complications: Secondary | ICD-10-CM | POA: Diagnosis not present

## 2022-03-01 DIAGNOSIS — N760 Acute vaginitis: Secondary | ICD-10-CM | POA: Diagnosis not present

## 2022-03-01 DIAGNOSIS — I739 Peripheral vascular disease, unspecified: Secondary | ICD-10-CM | POA: Diagnosis not present

## 2022-03-01 DIAGNOSIS — Z23 Encounter for immunization: Secondary | ICD-10-CM

## 2022-03-01 DIAGNOSIS — R69 Illness, unspecified: Secondary | ICD-10-CM | POA: Diagnosis not present

## 2022-03-01 DIAGNOSIS — J439 Emphysema, unspecified: Secondary | ICD-10-CM | POA: Diagnosis not present

## 2022-03-01 DIAGNOSIS — I700 Atherosclerosis of aorta: Secondary | ICD-10-CM | POA: Diagnosis not present

## 2022-03-01 DIAGNOSIS — I200 Unstable angina: Secondary | ICD-10-CM

## 2022-03-01 DIAGNOSIS — B9689 Other specified bacterial agents as the cause of diseases classified elsewhere: Secondary | ICD-10-CM

## 2022-03-01 DIAGNOSIS — N898 Other specified noninflammatory disorders of vagina: Secondary | ICD-10-CM | POA: Diagnosis not present

## 2022-03-01 DIAGNOSIS — F411 Generalized anxiety disorder: Secondary | ICD-10-CM

## 2022-03-01 LAB — WET PREP FOR TRICH, YEAST, CLUE
Clue Cell Exam: POSITIVE — AB
Trichomonas Exam: NEGATIVE
Yeast Exam: NEGATIVE

## 2022-03-01 LAB — MICROSCOPIC EXAMINATION: Bacteria, UA: NONE SEEN

## 2022-03-01 LAB — URINALYSIS, ROUTINE W REFLEX MICROSCOPIC
Bilirubin, UA: NEGATIVE
Glucose, UA: NEGATIVE
Nitrite, UA: NEGATIVE
RBC, UA: NEGATIVE
Specific Gravity, UA: >1.03 — ABNORMAL HIGH (ref 1.005–1.030)
Urobilinogen, Ur: 1 mg/dL (ref 0.2–1.0)
pH, UA: 5.5 (ref 5.0–7.5)

## 2022-03-01 MED ORDER — METRONIDAZOLE 500 MG PO TABS: 500.0000 mg | ORAL_TABLET | Freq: Two times a day (BID) | ORAL | 0 refills | Status: AC

## 2022-03-01 NOTE — Assessment & Plan Note (Signed)
Chronic. Followed by cardiology.  Continue to follow per their recommendations.  

## 2022-03-01 NOTE — Assessment & Plan Note (Signed)
Chronic. Followed by Cardiology. Continue with Rosuvastatin daily. Labs ordered. Will make recommendations based on lab results.

## 2022-03-01 NOTE — Telephone Encounter (Signed)
Coordinated with Wendy Poet to look into Ozempic PAP

## 2022-03-01 NOTE — Assessment & Plan Note (Signed)
Chronic.  Controlled.  Continue with current medication regimen.  Labs ordered today.  Return to clinic in 3 months for reevaluation.  Call sooner if concerns arise.   

## 2022-03-01 NOTE — Assessment & Plan Note (Signed)
Right knee steroid injection done today.  See procedure note below.

## 2022-03-01 NOTE — Assessment & Plan Note (Addendum)
Chronic.  Controlled.  Continue with current medication regimen.  Labs ordered today.  Return to clinic in 3 months for reevaluation.  Call sooner if concerns arise. Smoking cessation since September 2022.

## 2022-03-01 NOTE — Assessment & Plan Note (Signed)
Chronic. Ongoing.  Rarely uses Xanax.  Refilled Xanax today.  30 pills should last around 45 days.  Will refill before next appt.  Follow up in 3 months for reevaluation.

## 2022-03-01 NOTE — Assessment & Plan Note (Signed)
Chronic.  Controlled.  Continue with current medication regimen.  Has been out of Ozempic but not back on it.  Can increase dose of Ozempic from 0.'5mg'$  to '1mg'$  weekly if A1c is elevated.  Labs ordered today.  Return to clinic in 3 months for reevaluation.  Call sooner if concerns arise.

## 2022-03-01 NOTE — Progress Notes (Signed)
BP 121/72   Pulse 86   Temp 98.2 F (36.8 C) (Oral)   Wt 197 lb 14.4 oz (89.8 kg)   SpO2 98%   BMI 31.94 kg/m    Subjective:    Patient ID: Tammy Guerra, female    DOB: January 14, 1951, 71 y.o.   MRN: [id number]  HPI: Tammy Guerra is a 71 y.o. female  Chief Complaint  Patient presents with   Hypertension   Hyperlipidemia   Diabetes    3 month follow up    Vaginal Itching    Patient c/o vaginal itching and vaginal discharge x 1 day.    Knee Pain    R knee pain, states a lot of stiffness. Patient would like rx to help with inflammation.    COPD Stopped smoking 12/23/20. COPD status: controlled Satisfied with current treatment?: yes Oxygen use: no Dyspnea frequency:  not really Cough frequency: no Rescue inhaler frequency:   Limitation of activity: no Productive cough: no Last Spirometry:  Pneumovax: Up to Date Influenza: Not up to Date  DIABETES Hypoglycemic episodes:no Polydipsia/polyuria: no Visual disturbance: no Chest pain: no Paresthesias: no Glucose Monitoring: no  Accucheck frequency: Daily  Fasting glucose: 117-120  Post prandial:  Evening:  Before meals: Taking Insulin?: no  Long acting insulin:  Short acting insulin: Blood Pressure Monitoring: not checking Retinal Examination: Up to Date Foot Exam: Not up to Date Diabetic Education: Not Completed Pneumovax: Not up to Date Influenza: Not up to Date Aspirin: no  ANXIETY Patient states her anxiety has been about the same.  She was worried about the Ozempic problem for a little bit but not that is resolved.  She rarely uses the xanax.  But having them on hand if she does need them decreases her anxiety. She is still doing well with the trazodone at bedtime.     Denies HA, CP, SOB, palpitations, visual changes, and lower extremity swelling.   KNEE PAIN Patient sates she is having right knee pain.  She has been going up and down stairs.     Relevant past medical, surgical, family and  social history reviewed and updated as indicated. Interim medical history since our last visit reviewed. Allergies and medications reviewed and updated.  Review of Systems  HENT:  Negative for congestion.        Smelling smoke   Eyes:  Negative for visual disturbance.  Respiratory:  Negative for cough, chest tightness and shortness of breath.   Cardiovascular:  Negative for chest pain, palpitations and leg swelling.  Musculoskeletal:        Right knee pain  Neurological:  Negative for dizziness and headaches.    Per HPI unless specifically indicated above     Objective:    BP 121/72   Pulse 86   Temp 98.2 F (36.8 C) (Oral)   Wt 197 lb 14.4 oz (89.8 kg)   SpO2 98%   BMI 31.94 kg/m   Wt Readings from Last 3 Encounters:  03/01/22 197 lb 14.4 oz (89.8 kg)  02/22/22 200 lb (90.7 kg)  11/27/21 190 lb (86.2 kg)    Physical Exam Vitals and nursing note reviewed.  Constitutional:      General: She is not in acute distress.    Appearance: Normal appearance. She is normal weight. She is not ill-appearing, toxic-appearing or diaphoretic.  HENT:     Head: Normocephalic.     Right Ear: External ear normal. A middle ear effusion is present.  Left Ear: External ear normal. A middle ear effusion is present.     Nose: Nose normal.     Mouth/Throat:     Mouth: Mucous membranes are moist.     Pharynx: Oropharynx is clear.  Eyes:     General:        Right eye: No discharge.        Left eye: No discharge.     Extraocular Movements: Extraocular movements intact.     Conjunctiva/sclera: Conjunctivae normal.     Pupils: Pupils are equal, round, and reactive to light.  Cardiovascular:     Rate and Rhythm: Normal rate and regular rhythm.     Heart sounds: No murmur heard. Pulmonary:     Effort: Pulmonary effort is normal. No respiratory distress.     Breath sounds: Normal breath sounds. No wheezing or rales.  Musculoskeletal:     Cervical back: Normal range of motion and neck  supple.     Comments: Swelling noted on lateral side of right knee  Skin:    General: Skin is warm and dry.     Capillary Refill: Capillary refill takes less than 2 seconds.  Neurological:     General: No focal deficit present.     Mental Status: She is alert and oriented to person, place, and time. Mental status is at baseline.  Psychiatric:        Mood and Affect: Mood normal.        Behavior: Behavior normal.        Thought Content: Thought content normal.        Judgment: Judgment normal.     Results for orders placed or performed in visit on 02/19/22  HM DIABETES EYE EXAM  Result Value Ref Range   HM Diabetic Eye Exam No Retinopathy No Retinopathy      Assessment & Plan:   Problem List Items Addressed This Visit       Cardiovascular and Mediastinum   PAD (peripheral artery disease) (HCC)    Chronic. Followed by cardiology.  Continue to follow per their recommendations.       Aortic atherosclerosis (HCC)    Chronic. Followed by Cardiology. Continue with Rosuvastatin daily. Labs ordered. Will make recommendations based on lab results.       Relevant Orders   Lipid Profile   Accelerating angina ([place]) - Primary    Chronic.  Controlled.  Continue with current medication regimen.  Labs ordered today.  Return to clinic in 3 months for reevaluation.  Call sooner if concerns arise.        Relevant Orders   Comp Met (CMET)     Respiratory   Emphysema lung (HCC)    Chronic.  Controlled.  Continue with current medication regimen.  Labs ordered today.  Return to clinic in 3 months for reevaluation.  Call sooner if concerns arise. Smoking cessation since September 2022.         Endocrine   Diabetes mellitus type 2 with complications (HCC)    Chronic.  Controlled.  Continue with current medication regimen.  Has been out of Ozempic but not back on it.  Can increase dose of Ozempic from 0.63m to 139mweekly if A1c is elevated.  Labs ordered today.  Return to clinic in 3  months for reevaluation.  Call sooner if concerns arise.        Relevant Orders   Comp Met (CMET)   Microalbumin, Urine Waived   HgB A1c  Musculoskeletal and Integument   Arthritis of right knee    Right knee steroid injection done today.  See procedure note below.        Other   Generalized anxiety disorder    Chronic. Ongoing.  Rarely uses Xanax.  Refilled Xanax today.  30 pills should last around 45 days.  Will refill before next appt.  Follow up in 3 months for reevaluation.      Other Visit Diagnoses     Encounter for hepatitis C screening test for low risk patient       Relevant Orders   Hepatitis C Antibody   Vaginal discharge       Relevant Orders   Urinalysis, Routine w reflex microscopic   WET PREP FOR TRICH, YEAST, CLUE   Abnormal urinalysis       Relevant Orders   Urine Culture   Bacterial vaginosis       Will treat with flagyl. Complete course of antibiotics.  Follow up if symptoms not improved.   Relevant Medications   metroNIDAZOLE (FLAGYL) 500 MG tablet   Need for pneumococcal 20-valent conjugate vaccination       Relevant Orders   Pneumococcal conjugate vaccine 20-valent (Prevnar 20) (Completed)       Procedure: Right  Knee Intraarticular Steroid Injection        Diagnosis:   ICD-10-CM   1. Accelerating angina (HCC)  I20.0 Comp Met (CMET)    2. Aortic atherosclerosis (HCC)  I70.0 Lipid Profile    3. PAD (peripheral artery disease) (HCC)  I73.9     4. Pulmonary emphysema, unspecified emphysema type ([place])  J43.9     5. Diabetes mellitus type 2 with complications (HCC)  F12.1 Comp Met (CMET)    Microalbumin, Urine Waived    HgB A1c    6. Generalized anxiety disorder  F41.1     7. Encounter for hepatitis C screening test for low risk patient  Z11.59 Hepatitis C Antibody    8. Vaginal discharge  N89.8 Urinalysis, Routine w reflex microscopic    WET PREP FOR TRICH, YEAST, CLUE    9. Arthritis of right knee  M17.11     10. Abnormal  urinalysis  R82.90 Urine Culture    11. Bacterial vaginosis  N76.0    B96.89    Will treat with flagyl. Complete course of antibiotics.  Follow up if symptoms not improved.    12. Need for pneumococcal 20-valent conjugate vaccination  Z23 Pneumococcal conjugate vaccine 20-valent (Prevnar 20)      Physician: [name], NP Consent:  Risks, benefits, and alternative treatments discussed and all questions were answered.  Patient elected to proceed and verbal consent obtained.  Description: Area prepped and draped using  semi-sterile technique.  Using a anterior/lateral approach, a mixture of 4 cc of  1% lidocaine & 1 cc of Kenalog 40 was injected into knee joint.  A bandage was then placed over the injection site. Complications: none Post Procedure Instructions: Wound care instructions discussed and patient was instructed to keep area clean and dry.  Signs and symptoms of infection discussed, patient agrees to contact the office ASAP should they occur.  Follow Up: Return in about 3 months (around 06/01/2022) for HTN, HLD, DM2 FU.   Follow up plan: Return in about 3 months (around 06/01/2022) for HTN, HLD, DM2 FU.

## 2022-03-02 LAB — COMPREHENSIVE METABOLIC PANEL
ALT: 12 IU/L (ref 0–32)
AST: 14 IU/L (ref 0–40)
Albumin/Globulin Ratio: 2.5 — ABNORMAL HIGH (ref 1.2–2.2)
Albumin: 4.9 g/dL (ref 3.9–4.9)
Alkaline Phosphatase: 90 IU/L (ref 44–121)
BUN/Creatinine Ratio: 21 (ref 12–28)
BUN: 14 mg/dL (ref 8–27)
Bilirubin Total: 0.3 mg/dL (ref 0.0–1.2)
CO2: 22 mmol/L (ref 20–29)
Calcium: 10.6 mg/dL — ABNORMAL HIGH (ref 8.7–10.3)
Chloride: 100 mmol/L (ref 96–106)
Creatinine, Ser: 0.68 mg/dL (ref 0.57–1.00)
Globulin, Total: 2 g/dL (ref 1.5–4.5)
Glucose: 169 mg/dL — ABNORMAL HIGH (ref 70–99)
Potassium: 4 mmol/L (ref 3.5–5.2)
Sodium: 139 mmol/L (ref 134–144)
Total Protein: 6.9 g/dL (ref 6.0–8.5)
eGFR: 94 mL/min/{1.73_m2} (ref >59)

## 2022-03-02 LAB — LIPID PANEL
Chol/HDL Ratio: 2.3 ratio (ref 0.0–4.4)
Cholesterol, Total: 108 mg/dL (ref 100–199)
HDL: 48 mg/dL (ref >39)
LDL Chol Calc (NIH): 41 mg/dL (ref 0–99)
Triglycerides: 99 mg/dL (ref 0–149)
VLDL Cholesterol Cal: 19 mg/dL (ref 5–40)

## 2022-03-02 LAB — HEMOGLOBIN A1C
Est. average glucose Bld gHb Est-mCnc: 180 mg/dL
Hgb A1c MFr Bld: 7.9 % — ABNORMAL HIGH (ref 4.8–5.6)

## 2022-03-02 LAB — HEPATITIS C ANTIBODY: Hep C Virus Ab: NONREACTIVE

## 2022-03-03 LAB — URINE CULTURE

## 2022-03-05 NOTE — Progress Notes (Signed)
Hi Tammy Guerra. It was nice to see you yesterday.  Your lab work looks good.  Your A1c is elevated to 7.9 as expected.  I suspect getting back on the Ozempic will help return your A1c to your baseline.  Your cholesterol looks good.  No concerns at this time. Continue with your current medication regimen.  Follow up as discussed.  Please let me know if you have any questions.

## 2022-03-07 DIAGNOSIS — M1711 Unilateral primary osteoarthritis, right knee: Secondary | ICD-10-CM | POA: Diagnosis not present

## 2022-03-07 DIAGNOSIS — M7542 Impingement syndrome of left shoulder: Secondary | ICD-10-CM | POA: Diagnosis not present

## 2022-03-12 ENCOUNTER — Ambulatory Visit: Payer: Medicare HMO | Admitting: Dermatology

## 2022-03-14 ENCOUNTER — Other Ambulatory Visit: Payer: Self-pay | Admitting: Nurse Practitioner

## 2022-03-14 NOTE — Telephone Encounter (Signed)
Unable to refill per protocol, Rx request is too soon. Last refill 02/20/22 for 180 and 1 RF. Will refuse.  Requested Prescriptions  Pending Prescriptions Disp Refills   traZODone (DESYREL) 50 MG tablet [Pharmacy Med Name: TRAZODONE 50 MG TABLET] 180 tablet 4    Sig: TAKE 1 TABLET (50 MG TOTAL) BY MOUTH AT BEDTIME AS NEEDED FOR SLEEP (CAN TAKE 1-2 TABS AS NEEDED ).     Psychiatry: Antidepressants - Serotonin Modulator Passed - 03/14/2022  1:58 AM      Passed - Valid encounter within last 6 months    Recent Outpatient Visits           1 week ago Accelerating angina [facility])   [facility] [name], NP   4 months ago Annual physical exam   [facility] [name], NP   10 months ago Pulmonary emphysema, unspecified emphysema type ([place])   [facility] [name], NP   1 year ago Pulmonary emphysema, unspecified emphysema type ([place])   [facility] [name], NP   1 year ago Uncontrolled type 2 diabetes mellitus with hyperglycemia [facility])   [facility] [name], NP       Future Appointments             In 2 months [name], NP [facility], [place]   In 5 months Fletcher Anon, Mertie Clause, MD [place]. [place]

## 2022-03-20 ENCOUNTER — Other Ambulatory Visit: Payer: Self-pay | Admitting: Cardiovascular Disease

## 2022-04-09 ENCOUNTER — Telehealth: Payer: Self-pay

## 2022-04-09 ENCOUNTER — Other Ambulatory Visit: Payer: Self-pay | Admitting: Nurse Practitioner

## 2022-04-09 ENCOUNTER — Other Ambulatory Visit: Payer: Self-pay | Admitting: Cardiovascular Disease

## 2022-04-09 DIAGNOSIS — E1165 Type 2 diabetes mellitus with hyperglycemia: Secondary | ICD-10-CM

## 2022-04-09 NOTE — Progress Notes (Signed)
    Chronic Care Management Pharmacy Assistant   Name: KELSEE PRESLAR  MRN: [id number] DOB: 1950-06-17  Medications: Outpatient Encounter Medications as of 04/09/2022  Medication Sig   ALPRAZolam (XANAX) 0.25 MG tablet TAKE 1 TABLET BY MOUTH AS NEEDED FOR ANXIETY.   aspirin EC 81 MG tablet Take 81 mg by mouth daily. Swallow whole.   Blood Glucose Monitoring Suppl (ONE TOUCH ULTRA 2) w/Device KIT 1 each by Does not apply route daily.   cilostazol (PLETAL) 100 MG tablet Take 1 tablet by mouth twice daily   ezetimibe (ZETIA) 10 MG tablet Take 1 tablet by mouth once daily   glucose blood (ONETOUCH ULTRA) test strip TEST BLOOD SUGAR EVERY DAY   hydrocortisone (ANUSOL-HC) 2.5 % rectal cream Apply topically 2 (two) times daily. (Patient not taking: Reported on 03/01/2022)   lisinopril (ZESTRIL) 2.5 MG tablet Take 1 tablet (2.5 mg total) by mouth daily.   metFORMIN (GLUCOPHAGE-XR) 500 MG 24 hr tablet TAKE 2 TABLETS BY MOUTH DAILY WITH DINNER.   rosuvastatin (CRESTOR) 20 MG tablet Take 1 tablet by mouth once daily   Semaglutide,0.25 or 0.5MG/DOS, (OZEMPIC, 0.25 OR 0.5 MG/DOSE,) 2 MG/1.5ML SOPN Inject 0.5 mg into the skin once a week. Start with 0.25MG once a week x 4 weeks, then increase to 0.5MG weekly.   traZODone (DESYREL) 50 MG tablet TAKE 1 TABLET (50 MG TOTAL) BY MOUTH AT BEDTIME AS NEEDED FOR SLEEP (CAN TAKE 1-2 TABS AS NEEDED ).   No facility-administered encounter medications on file as of 04/09/2022.   Reviewed chart for medication changes ahead of medication coordination call.  No OVs, Consults, or hospital visits since last care coordination call/Pharmacist visit. (If appropriate, list visit date, provider name)  No medication changes indicated OR if recent visit, treatment plan here.  BP Readings from Last 3 Encounters:  03/01/22 121/72  02/22/22 110/66  10/16/21 114/73    Lab Results  Component Value Date   HGBA1C 7.9 (H) 03/01/2022     Patient obtains medications through  Adherence Packaging  30 Days   Last adherence delivery included:  Cilostazol 100 mg 1 tab Breakfast and 1 tab at Bedtime Ezetimbe 10 mg 1 tab Bedtime Lisinopril 2.5 mg 1 tab at Bedtime Metformin ER 500 mg 1 tab Breakfast, 1 tab Bedtime Pantoprazole 40 mg 1 tab Bedtime Rosuvastatin 20 mg 1 tab Bedtime Trazodone 50 mg 1 tab Bedtime  Patient is due for next adherence delivery on: 04/23/22. Called patient and reviewed medications and coordinated delivery.  This delivery to include: Cilostazol 100 mg 1 tab Breakfast and 1 tab at Bedtime Ezetimbe 10 mg 1 tab Bedtime Lisinopril 2.5 mg 1 tab at Bedtime Metformin ER 500 mg 1 tab Breakfast, 1 tab Bedtime Pantoprazole 40 mg 1 tab Bedtime Rosuvastatin 20 mg 1 tab Bedtime Trazodone 50 mg 1 tab Bedtime   Patient declined the following medications (All meds) due to (patient does not want mail delivery at this time. She has contacted [facility] to get meds transferred back to [facility])  [place] [phone]

## 2022-04-10 NOTE — Telephone Encounter (Signed)
Requested medication (s) are due for refill today: Yes  Requested medication (s) are on the active medication list: Yes  Last refill:  01/25/22  Future visit scheduled: Yes  Notes to clinic:  Last filled by Dr. Fletcher Anon.    Requested Prescriptions  Pending Prescriptions Disp Refills   lisinopril (ZESTRIL) 2.5 MG tablet [Pharmacy Med Name: Lisinopril 2.5 MG Oral Tablet] 90 tablet 0    Sig: Take 1 tablet by mouth once daily     Cardiovascular:  ACE Inhibitors Passed - 04/09/2022  2:58 PM      Passed - Cr in normal range and within 180 days    Creatinine, Ser  Date Value Ref Range Status  03/01/2022 0.68 0.57 - 1.00 mg/dL Final         Passed - K in normal range and within 180 days    Potassium  Date Value Ref Range Status  03/01/2022 4.0 3.5 - 5.2 mmol/L Final         Passed - Patient is not pregnant      Passed - Last BP in normal range    BP Readings from Last 1 Encounters:  03/01/22 121/72         Passed - Valid encounter within last 6 months    Recent Outpatient Visits           1 month ago Accelerating angina [facility])   [facility] [name], NP   5 months ago Annual physical exam   [facility] [name], NP   11 months ago Pulmonary emphysema, unspecified emphysema type ([place])   [place], Karen, NP   1 year ago Pulmonary emphysema, unspecified emphysema type ([place])   [facility] [name], NP   1 year ago Uncontrolled type 2 diabetes mellitus with hyperglycemia ([place])   [facility] [name], NP       Future Appointments             In 1 month [name], NP [name], [place]   In 4 months Fletcher Anon, Mertie Clause, MD [place]. [place]

## 2022-04-11 ENCOUNTER — Telehealth: Payer: Self-pay | Admitting: Nurse Practitioner

## 2022-04-11 NOTE — Telephone Encounter (Signed)
Copied from [place] [id number]. Topic: General - Other >> Apr 11, 2022 12:35 PM Ja-Kwan M wrote: Reason for CRM: Pt called to see if the paperwork that she has to sign was received. Pt requests call back at [phone] >> Apr 11, 2022  4:25 PM Everette C wrote: The patient would like to speak with a member of clinical staff when available

## 2022-04-12 NOTE — Telephone Encounter (Signed)
Called patient to let her know we received [employer] paperwork for patient assistance that has been completed and faxed by [place] but she did not need to sign it. LVM asking her to call back to let her know this. OK for PEC to give this message.

## 2022-04-14 IMAGING — MR MR ABDOMEN WO/W CM MRCP
19 of 21 series · 44 of 48 positions shown · IV contrast (8ml Gadavist)
Comparison: 09/06/2019, 11/11/2018, 07/30/2017

CLINICAL DATA: Follow-up pancreatic cysts

EXAM:
MRI ABDOMEN WITHOUT AND WITH CONTRAST (INCLUDING MRCP)
TECHNIQUE: Multiplanar multisequence MR imaging of the abdomen was performed
both before and after the administration of intravenous contrast.
Heavily T2-weighted images of the biliary and pancreatic ducts were
obtained, and three-dimensional MRCP images were rendered by post
processing.
CONTRAST:  8mL GADAVIST GADOBUTROL 1 MMOL/ML IV SOLN

[Series 3: T2 · coronal · 6.0mm · 1.19mm/px · 1 of 30 slices shown (1 of 2)]
[im 1/30]
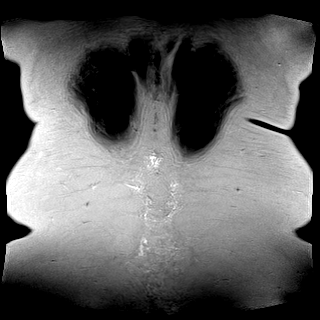

[Series 4: T2 · axial · 6.0mm · 1.19mm/px · 1 of 36 slices shown (2 of 2)]
[im 1/36]
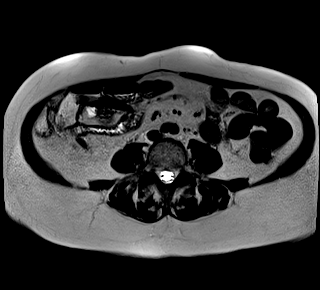

[Series 5: in & out · axial · 3.0mm · 1.19mm/px · z∈[-62,+175]mm · 2 of 80 slices shown (1 of 2)]
[im 1/80]
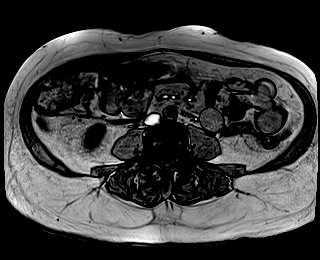
[im 80/80]
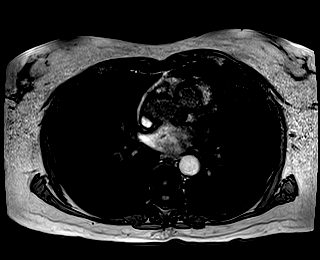

[Series 6: in & out · axial · 3.0mm · 1.19mm/px · z∈[-62,+175]mm · 2 of 80 slices shown (2 of 2)]
[im 1/80]
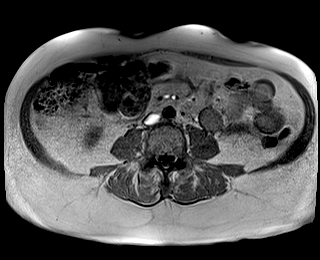
[im 80/80]
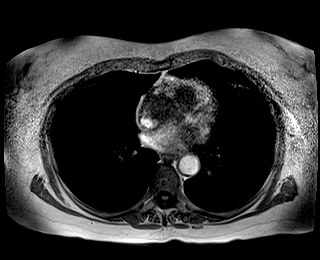

[Series 9: T2 fat-sat · axial · 6.0mm · 1.19mm/px · 1 of 34 slices shown]
[im 1/34]
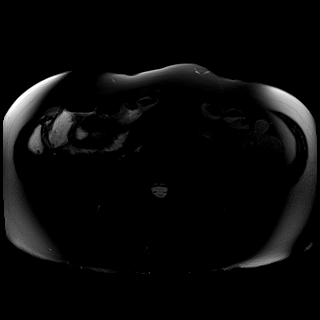

[Series 10: ax dwi_tracew · axial · 6.0mm · 1.42mm/px · z∈[-64,+174]mm · 4 of 102 slices shown]
[im 1/102]
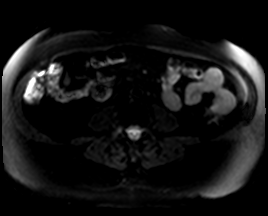
[im 34/102]
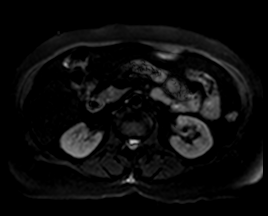
[im 68/102]
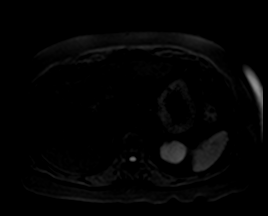
[im 102/102]
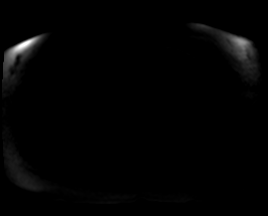

[Series 11: ax dwi_adc · axial · 6.0mm · 1.42mm/px · 1 of 34 slices shown]
[im 1/34]
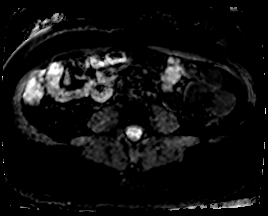

[Series 16: MRCP · coronal · 3.0mm · 1.12mm/px · 1 of 22 slices shown]
[im 1/22]
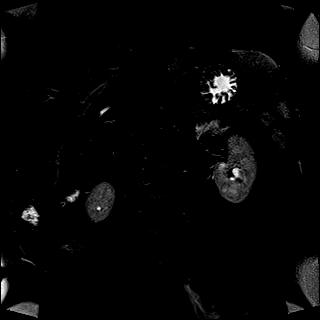

[Series 17: radials · coronal · 50.0mm · 0.78mm/px · 1 of 5 slices shown]
[im 1/5]
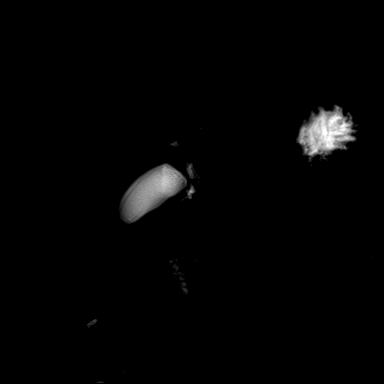

[Series 18: T1 dynamic fat-sat · axial · non-contrast · 3.0mm · 1.19mm/px · z∈[-42,+171]mm · 3 of 72 slices shown (1 of 5)]
[im 1/72]
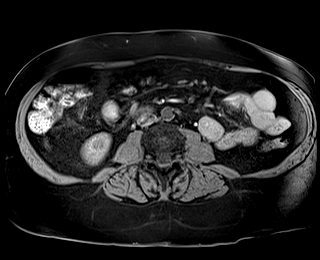
[im 36/72]
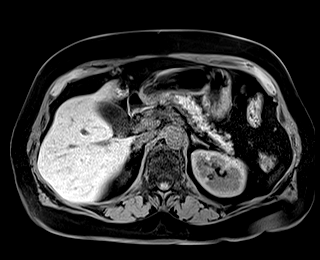
[im 72/72]
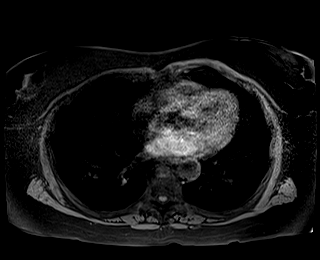

[Series 19: T1 dynamic fat-sat post-contrast · axial · 3.0mm · 1.19mm/px · z∈[-42,+171]mm · 3 of 72 slices shown (1 of 4)]
[im 1/72]
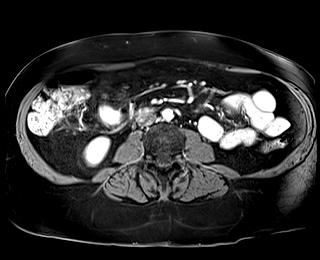
[im 36/72]
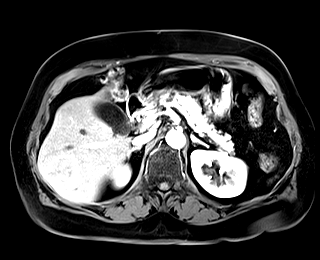
[im 72/72]
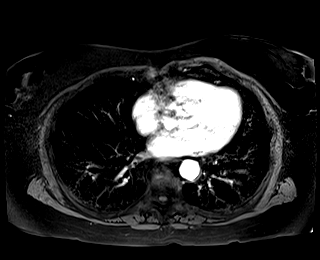

[Series 20: T1 dynamic fat-sat · axial · 3.0mm · 1.19mm/px · z∈[-42,+171]mm · 3 of 72 slices shown (2 of 5)]
[im 1/72]
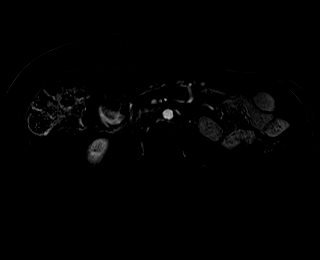
[im 36/72]
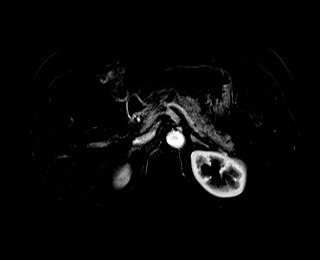
[im 72/72]
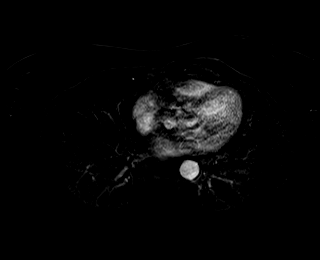

[Series 21: T1 dynamic fat-sat post-contrast · axial · 3.0mm · 1.19mm/px · z∈[-42,+171]mm · 3 of 72 slices shown (2 of 4)]
[im 1/72]
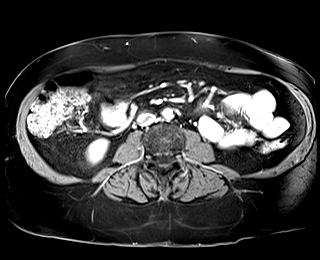
[im 36/72]
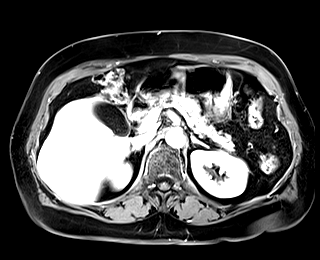
[im 72/72]
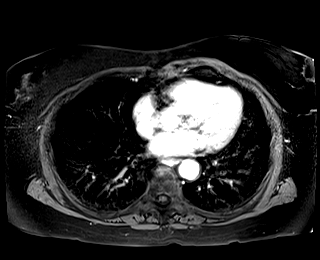

[Series 22: T1 dynamic fat-sat · axial · 3.0mm · 1.19mm/px · z∈[-42,+171]mm · 3 of 72 slices shown (3 of 5)]
[im 1/72]
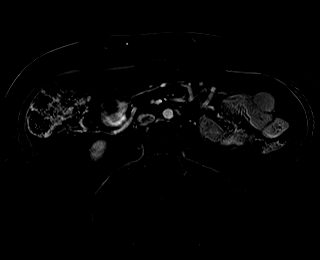
[im 36/72]
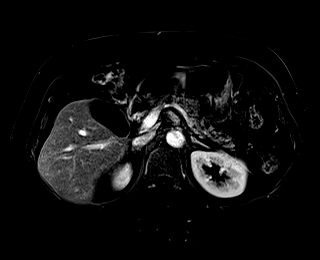
[im 72/72]
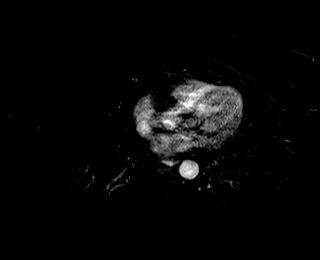

[Series 23: T1 dynamic fat-sat post-contrast · axial · 3.0mm · 1.19mm/px · z∈[-42,+171]mm · 3 of 72 slices shown (3 of 4)]
[im 1/72]
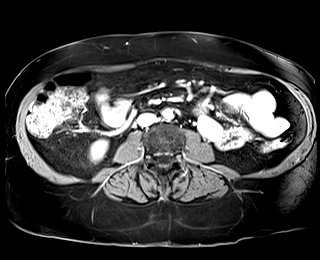
[im 36/72]
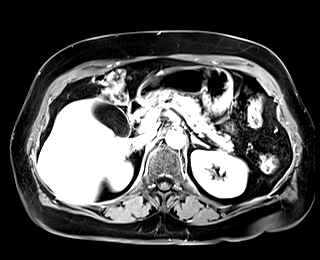
[im 72/72]
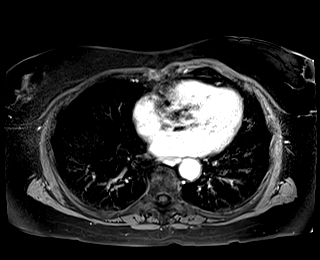

[Series 24: T1 dynamic fat-sat · axial · 3.0mm · 1.19mm/px · z∈[-42,+171]mm · 3 of 72 slices shown (4 of 5)]
[im 1/72]
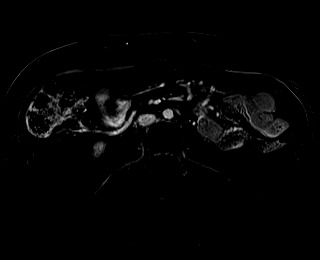
[im 36/72]
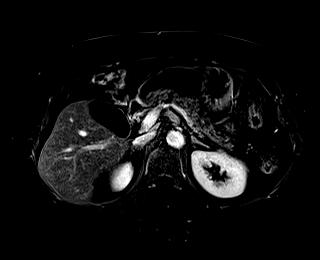
[im 72/72]
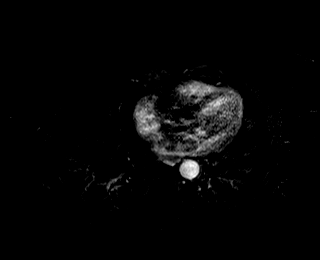

[Series 25: T1 dynamic post-contrast · coronal · 3.0mm · 1.31mm/px · 3 of 72 slices shown]
[im 1/72]
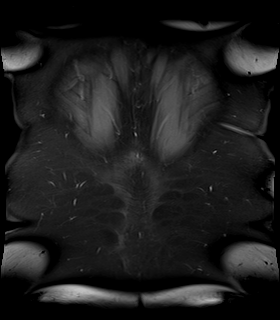
[im 36/72]
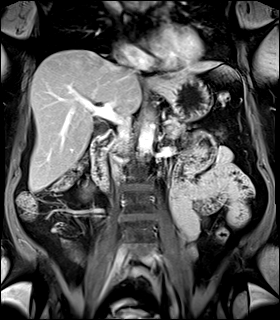
[im 72/72]
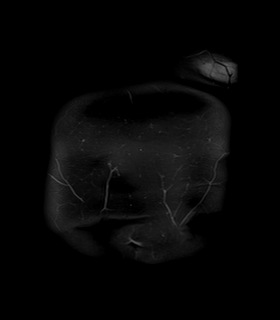

[Series 26: T1 dynamic fat-sat post-contrast · axial · 3.0mm · 1.19mm/px · z∈[-42,+171]mm · 3 of 72 slices shown (4 of 4)]
[im 1/72]
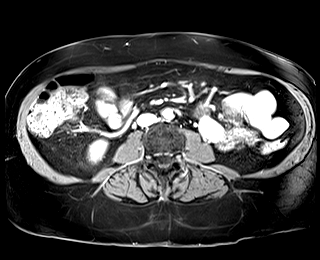
[im 36/72]
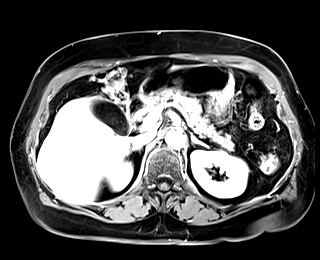
[im 72/72]
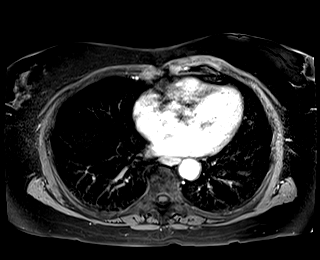

[Series 27: T1 dynamic fat-sat · axial · 3.0mm · 1.19mm/px · z∈[-42,+171]mm · 3 of 72 slices shown (5 of 5)]
[im 1/72]
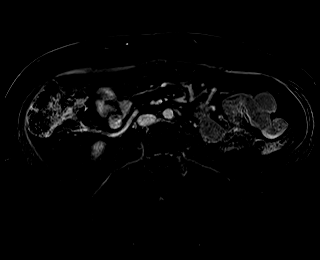
[im 36/72]
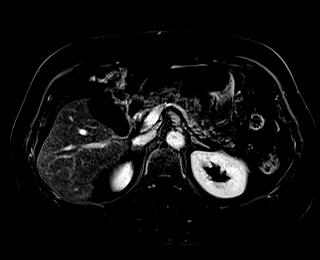
[im 72/72]
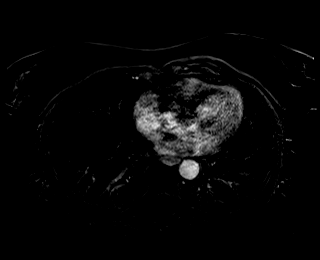

[44 of 48 positions shown; findings below may reference images not displayed]

FINDINGS: Lower chest: No acute findings.

Hepatobiliary: No mass or other parenchymal abnormality identified.
No gallstones. No biliary ductal dilatation.

Pancreas: Unchanged size and appearance of numerous small cystic
lesions throughout the pancreas, the largest again a lesion of the
pancreatic uncinate measuring 1.7 x 1.2 cm, with numerous additional
subcentimeter lesions throughout the pancreatic neck, body, and tail
(series 9, image 17). These are not significantly changed on
examinations dating back to 07/30/2017. No solid mass, inflammatory
changes, or other parenchymal abnormality identified.No pancreatic
ductal dilatation.

Spleen:  Within normal limits in size and appearance.

Adrenals/Urinary Tract: Normal adrenal glands. Multiple simple and
thinly septated, benign renal cysts. Occasional small hemorrhagic or
proteinaceous cysts. No solid renal masses or suspicious contrast
enhancement identified. No evidence of hydronephrosis.

Stomach/Bowel: Visualized portions within the abdomen are
unremarkable.

Vascular/Lymphatic: No pathologically enlarged lymph nodes
identified. No abdominal aortic aneurysm demonstrated. Aortic
atherosclerosis.

Other:  None.

Musculoskeletal: No suspicious osseous lesions identified.
IMPRESSION: 1. Unchanged size and appearance of numerous small cystic lesions
throughout the pancreas, the largest lesion of the pancreatic
uncinate measuring 1.7 x 1.2 cm, with numerous additional
subcentimeter lesions throughout the pancreatic neck, body, and
tail. These are not significantly changed on examinations dating
back to 07/30/2017 and are without associated solid component or
contrast enhancement. As there is no observed increased risk of
malignancy for such lesions smaller than 2 cm, especially given
well-established stability of nearly 4 years, no further follow-up
or characterization is required for these benign lesions.
2. No acute findings in the abdomen.

Aortic Atherosclerosis ([id number]).

## 2022-04-20 ENCOUNTER — Other Ambulatory Visit: Payer: Self-pay | Admitting: Nurse Practitioner

## 2022-04-20 NOTE — Telephone Encounter (Signed)
Requested medication (s) are due for refill today: routing for approval  Requested medication (s) are on the active medication list: yes  Last refill:  01/17/22  Future visit scheduled: yes  Notes to clinic:  Unable to refill per protocol, cannot delegate.      Requested Prescriptions  Pending Prescriptions Disp Refills   ALPRAZolam (XANAX) 0.25 MG tablet [Pharmacy Med Name: ALPRAZolam 0.25 MG Oral Tablet] 30 tablet 0    Sig: TAKE 1 TABLET BY MOUTH AS NEEDED FOR ANXIETY.     Not Delegated - Psychiatry: Anxiolytics/Hypnotics 2 Failed - 04/20/2022  6:52 AM      Failed - This refill cannot be delegated      Failed - Urine Drug Screen completed in last 360 days      Passed - Patient is not pregnant      Passed - Valid encounter within last 6 months    Recent Outpatient Visits           1 month ago Accelerating angina [facility])   [facility] [name], NP   6 months ago Annual physical exam   [place], NP   11 months ago Pulmonary emphysema, unspecified emphysema type ([place])   [facility] [name], NP   1 year ago Pulmonary emphysema, unspecified emphysema type ([place])   [facility] [name], NP   1 year ago Uncontrolled type 2 diabetes mellitus with hyperglycemia ([place])   [facility] [name], NP       Future Appointments             In 1 month [name], NP [name], [place]   In 4 months Fletcher Anon, Mertie Clause, MD [place]. [place]

## 2022-04-25 ENCOUNTER — Ambulatory Visit: Payer: Self-pay

## 2022-04-25 ENCOUNTER — Telehealth: Payer: Self-pay

## 2022-04-25 ENCOUNTER — Encounter: Payer: Self-pay | Admitting: Nurse Practitioner

## 2022-04-25 NOTE — Progress Notes (Cosign Needed)
Spoke with patient today who states that she does not want to use a mail delivery for her medications. She stated that she was getting all of her medication without charge from [place] and would like to keep it that way. Patient states that she is needing a refill for Rosuvastatin but will call [facility] to ask if they can fill it for her. Patient will call me back after she checks with [place] if medications have been transferred.   Tammy Guerra

## 2022-04-25 NOTE — Telephone Encounter (Signed)
    Chief Complaint: COVID positive, sore throat. Virtual appointment for tomorrow. Symptoms: Above Frequency: Sunday. Pertinent Negatives: Patient denies fever Disposition: '[]'$ ED /'[]'$ Urgent Care (no appt availability in office) / '[x]'$ Appointment(In office/virtual)/ '[]'$  [place] [facility]/ '[]'$ Home Care/ '[]'$ Refused Recommended Disposition /'[]'$ [place] [facility]/ '[]'$  Follow-up with PCP Additional Notes:   Answer Assessment - Initial Assessment Questions 1. COVID-19 DIAGNOSIS: "How do you know that you have COVID?" (e.g., positive lab test or self-test, diagnosed by doctor or NP/PA, symptoms after exposure).     Home 2. COVID-19 EXPOSURE: "Was there any known exposure to COVID before the symptoms began?" CDC Definition of close contact: within 6 feet (2 meters) for a total of 15 minutes or more over a 24-hour period.      No 3. ONSET: "When did the COVID-19 symptoms start?"      Sunday 4. WORST SYMPTOM: "What is your worst symptom?" (e.g., cough, fever, shortness of breath, muscle aches)     Sore throat 5. COUGH: "Do you have a cough?" If Yes, ask: "How bad is the cough?"       No 6. FEVER: "Do you have a fever?" If Yes, ask: "What is your temperature, how was it measured, and when did it start?"     No 7. RESPIRATORY STATUS: "Describe your breathing?" (e.g., normal; shortness of breath, wheezing, unable to speak)      Fine 8. BETTER-SAME-WORSE: "Are you getting better, staying the same or getting worse compared to yesterday?"  If getting worse, ask, "In what way?"     Same 9. OTHER SYMPTOMS: "Do you have any other symptoms?"  (e.g., chills, fatigue, headache, loss of smell or taste, muscle pain, sore throat)     Head cold 10. HIGH RISK DISEASE: "Do you have any chronic medical problems?" (e.g., asthma, heart or lung disease, weak immune system, obesity, etc.)       No 11. VACCINE: "Have you had the COVID-19 vaccine?" If Yes, ask: "Which one, how many shots, when did you get it?"        N/a 12. PREGNANCY: "Is there any chance you are pregnant?" "When was your last menstrual period?"       No 13. O2 SATURATION MONITOR:  "Do you use an oxygen saturation monitor (pulse oximeter) at home?" If Yes, ask "What is your reading (oxygen level) today?" "What is your usual oxygen saturation reading?" (e.g., 95%)       No  Protocols used: Coronavirus (COVID-19) Diagnosed or Suspected-A-AH

## 2022-04-26 ENCOUNTER — Telehealth (INDEPENDENT_AMBULATORY_CARE_PROVIDER_SITE_OTHER): Payer: Medicare HMO | Admitting: Nurse Practitioner

## 2022-04-26 ENCOUNTER — Encounter: Payer: Self-pay | Admitting: Nurse Practitioner

## 2022-04-26 DIAGNOSIS — U071 COVID-19: Secondary | ICD-10-CM | POA: Diagnosis not present

## 2022-04-26 MED ORDER — LIDOCAINE VISCOUS HCL 2 % MT SOLN: 15.0000 mL | OROMUCOSAL | 0 refills | Status: DC | PRN

## 2022-04-26 NOTE — Progress Notes (Signed)
There were no vitals taken for this visit.   Subjective:    Patient ID: Tammy Guerra, female    DOB: 13-Nov-1950, 72 y.o.   MRN: [id number]  HPI: Tammy Guerra is a 72 y.o. female  Chief Complaint  Patient presents with   Covid Positive    Pt states she tested positive for covid Sunday evening. States she has been having a lot of head and sinus congestion. States her throat started hurting 3 days ago and that it is very sore.    UPPER RESPIRATORY TRACT INFECTION Worst symptom: tested positive for COVID in Sunday evening. Fever:  resolved Cough: no Shortness of breath: no Wheezing: no Chest pain: no Chest tightness: no Chest congestion: no Nasal congestion: yes Runny nose: yes Post nasal drip: yes Sneezing: no Sore throat: yes Swollen glands: yes Sinus pressure: no Headache: no Face pain: no Toothache: no Ear pain: no bilateral Ear pressure: no bilateral Eyes red/itching:no Eye drainage/crusting: no  Vomiting: no Rash: no Fatigue: yes Sick contacts: no Strep contacts: no  Context: worse- because of her throat Recurrent sinusitis: no Relief with OTC cold/cough medications: yes  Treatments attempted:  alka seltzer, ibuprofen and tea    Relevant past medical, surgical, family and social history reviewed and updated as indicated. Interim medical history since our last visit reviewed. Allergies and medications reviewed and updated.  Review of Systems  Constitutional:  Positive for fatigue. Negative for fever.  HENT:  Positive for congestion, postnasal drip, rhinorrhea and sore throat. Negative for dental problem, ear pain, sinus pressure, sinus pain and sneezing.   Respiratory:  Negative for cough, shortness of breath and wheezing.   Cardiovascular:  Negative for chest pain.  Gastrointestinal:  Negative for vomiting.  Skin:  Negative for rash.  Neurological:  Negative for headaches.    Per HPI unless specifically indicated above     Objective:     There were no vitals taken for this visit.  Wt Readings from Last 3 Encounters:  03/01/22 197 lb 14.4 oz (89.8 kg)  02/22/22 200 lb (90.7 kg)  11/27/21 190 lb (86.2 kg)    Physical Exam Vitals and nursing note reviewed.  Constitutional:      General: She is not in acute distress.    Appearance: She is not ill-appearing.  HENT:     Head: Normocephalic.     Right Ear: Hearing normal.     Left Ear: Hearing normal.     Nose: Nose normal.  Pulmonary:     Effort: Pulmonary effort is normal. No respiratory distress.  Neurological:     Mental Status: She is alert.  Psychiatric:        Mood and Affect: Mood normal.        Behavior: Behavior normal.        Thought Content: Thought content normal.        Judgment: Judgment normal.     Results for orders placed or performed in visit on 03/01/22  WET PREP FOR [place], YEAST, CLUE   Specimen: Sterile Swab   Sterile Swab  Result Value Ref Range   Trichomonas Exam Negative Negative   Yeast Exam Negative Negative   Clue Cell Exam Positive (A) Negative  Urine Culture   Specimen: Urine   UR  Result Value Ref Range   Urine Culture, Routine Final report    Organism ID, Bacteria Comment   Microscopic Examination   Urine  Result Value Ref Range   WBC, UA 0-5 0 -  5 /hpf   RBC, Urine 0-2 0 - 2 /hpf   Epithelial Cells (non renal) 0-10 0 - 10 /hpf   Crystals Present (A) N/A   Crystal Type Calcium Oxalate N/A   Mucus, UA Present (A) Not Estab.   Bacteria, UA None seen None seen/Few  Comp Met (CMET)  Result Value Ref Range   Glucose 169 (H) 70 - 99 mg/dL   BUN 14 8 - 27 mg/dL   Creatinine, Ser 0.68 0.57 - 1.00 mg/dL   eGFR 94 >59 mL/min/1.73   BUN/Creatinine Ratio 21 12 - 28   Sodium 139 134 - 144 mmol/L   Potassium 4.0 3.5 - 5.2 mmol/L   Chloride 100 96 - 106 mmol/L   CO2 22 20 - 29 mmol/L   Calcium 10.6 (H) 8.7 - 10.3 mg/dL   Total Protein 6.9 6.0 - 8.5 g/dL   Albumin 4.9 3.9 - 4.9 g/dL   Globulin, Total 2.0 1.5 - 4.5 g/dL    Albumin/Globulin Ratio 2.5 (H) 1.2 - 2.2   Bilirubin Total 0.3 0.0 - 1.2 mg/dL   Alkaline Phosphatase 90 44 - 121 IU/L   AST 14 0 - 40 IU/L   ALT 12 0 - 32 IU/L  Lipid Profile  Result Value Ref Range   Cholesterol, Total 108 100 - 199 mg/dL   Triglycerides 99 0 - 149 mg/dL   HDL 48 >39 mg/dL   VLDL Cholesterol Cal 19 5 - 40 mg/dL   LDL Chol Calc (NIH) 41 0 - 99 mg/dL   Chol/HDL Ratio 2.3 0.0 - 4.4 ratio  Hepatitis C Antibody  Result Value Ref Range   Hep C Virus Ab Non Reactive Non Reactive  HgB A1c  Result Value Ref Range   Hgb A1c MFr Bld 7.9 (H) 4.8 - 5.6 %   Est. average glucose Bld gHb Est-mCnc 180 mg/dL  Urinalysis, Routine w reflex microscopic  Result Value Ref Range   Specific Gravity, UA >1.030 (H) 1.005 - 1.030   pH, UA 5.5 5.0 - 7.5   Color, UA Yellow Yellow   Appearance Ur Clear Clear   Leukocytes,UA Trace (A) Negative   Protein,UA 1+ (A) Negative/Trace   Glucose, UA Negative Negative   Ketones, UA Trace (A) Negative   RBC, UA Negative Negative   Bilirubin, UA Negative Negative   Urobilinogen, Ur 1.0 0.2 - 1.0 mg/dL   Nitrite, UA Negative Negative   Microscopic Examination See below:       Assessment & Plan:   Problem List Items Addressed This Visit   None Visit Diagnoses     COVID-19    -  Primary   Symptoms are improving but sore throat. Will treat with Lidocaine PRN for throat pain. Continue with PRN OTC medications for symptom management. Stay hydrated.        Follow up plan: Return if symptoms worsen or fail to improve.  This visit was completed via MyChart due to the restrictions of the COVID-19 pandemic. All issues as above were discussed and addressed. Physical exam was done as above through visual confirmation on MyChart. If it was felt that the patient should be evaluated in the office, they were directed there. The patient verbally consented to this visit. Location of the patient: Home Location of the provider: Office Those involved with  this call:  Provider: [name], NP CMA: [place] Desk/Registration: Lynnell Catalan This encounter was conducted via video.  I spent 20 dedicated to the care of this patient  on the date of this encounter to include previsit review of symptoms, plan of care and follow up, face to face time with the patient, and post visit ordering of testing.

## 2022-05-21 ENCOUNTER — Other Ambulatory Visit: Payer: Self-pay | Admitting: Nurse Practitioner

## 2022-05-22 NOTE — Telephone Encounter (Signed)
Refilled 02/20/2022 #180 1 rf. Requested Prescriptions  Pending Prescriptions Disp Refills   metFORMIN (GLUCOPHAGE-XR) 500 MG 24 hr tablet [Pharmacy Med Name: metFORMIN HCl ER 500 MG Oral Tablet Extended Release 24 Hour] 360 tablet 0    Sig: TAKE 2 TABLETS BY MOUTH WITH BREAKFAST AND 2 TABLETS AT BEDTIME     Endocrinology:  Diabetes - Biguanides Failed - 05/21/2022 11:44 AM      Failed - B12 Level in normal range and within 720 days    No results found for: "VITAMINB12"       Passed - Cr in normal range and within 360 days    Creatinine, Ser  Date Value Ref Range Status  03/01/2022 0.68 0.57 - 1.00 mg/dL Final         Passed - HBA1C is between 0 and 7.9 and within 180 days    Hgb A1c MFr Bld  Date Value Ref Range Status  03/01/2022 7.9 (H) 4.8 - 5.6 % Final    Comment:             Prediabetes: 5.7 - 6.4          Diabetes: >6.4          Glycemic control for adults with diabetes: <7.0          Passed - eGFR in normal range and within 360 days    GFR calc Af Amer  Date Value Ref Range Status  05/27/2017 >60 >60 mL/min Final    Comment:    (NOTE) The eGFR has been calculated using the CKD EPI equation. This calculation has not been validated in all clinical situations. eGFR's persistently <60 mL/min signify possible Chronic Kidney Disease.    GFR, Estimated  Date Value Ref Range Status  09/01/2021 >60 >60 mL/min Final    Comment:    (NOTE) Calculated using the CKD-EPI Creatinine Equation (2021)    eGFR  Date Value Ref Range Status  03/01/2022 94 >59 mL/min/1.73 Final         Passed - Valid encounter within last 6 months    Recent Outpatient Visits           3 weeks ago [place] [name], NP   2 months ago Accelerating angina [facility])   [place] [name], NP   7 months ago Annual physical exam   [place] [name], NP   1 year ago Pulmonary  emphysema, unspecified emphysema type [facility])   [place] [name], NP   1 year ago Pulmonary emphysema, unspecified emphysema type [facility])   [place] [name], NP       Future Appointments             In 1 week [name], NP [place], [place]   In 3 months [name], MD [place] [facility] [place] within normal limits and completed in the last 12 months    WBC  Date Value Ref Range Status  10/16/2021 8.2 3.4 - 10.8 x10E3/uL Final  09/01/2021 10.1 4.0 - 10.5 K/uL Final   RBC  Date Value Ref Range Status  10/16/2021 4.30 3.77 - 5.28 x10E6/uL Final  09/01/2021 4.62 3.87 - 5.11 MIL/uL Final   Hemoglobin  Date Value Ref Range Status  10/16/2021 13.3 11.1 - 15.9  g/dL Final   Hematocrit  Date Value Ref Range Status  10/16/2021 40.0 34.0 - 46.6 % Final   MCHC  Date Value Ref Range Status  10/16/2021 33.3 31.5 - 35.7 g/dL Final  09/01/2021 33.5 30.0 - 36.0 g/dL Final   [facility]  Date Value Ref Range Status  10/16/2021 30.9 26.6 - 33.0 pg Final  09/01/2021 30.1 26.0 - 34.0 pg Final   MCV  Date Value Ref Range Status  10/16/2021 93 79 - 97 fL Final   No results found for: "PLTCOUNTKUC", "LABPLAT", "POCPLA" RDW  Date Value Ref Range Status  10/16/2021 12.4 11.7 - 15.4 % Final

## 2022-06-01 ENCOUNTER — Ambulatory Visit (INDEPENDENT_AMBULATORY_CARE_PROVIDER_SITE_OTHER): Payer: Medicare HMO | Admitting: Nurse Practitioner

## 2022-06-01 ENCOUNTER — Encounter: Payer: Self-pay | Admitting: Nurse Practitioner

## 2022-06-01 VITALS — BP 119/74 | HR 73 | Temp 98.7°F | Wt 191.8 lb

## 2022-06-01 DIAGNOSIS — J439 Emphysema, unspecified: Secondary | ICD-10-CM

## 2022-06-01 DIAGNOSIS — I700 Atherosclerosis of aorta: Secondary | ICD-10-CM

## 2022-06-01 DIAGNOSIS — I25118 Atherosclerotic heart disease of native coronary artery with other forms of angina pectoris: Secondary | ICD-10-CM | POA: Diagnosis not present

## 2022-06-01 DIAGNOSIS — E118 Type 2 diabetes mellitus with unspecified complications: Secondary | ICD-10-CM

## 2022-06-01 DIAGNOSIS — I739 Peripheral vascular disease, unspecified: Secondary | ICD-10-CM

## 2022-06-01 DIAGNOSIS — R69 Illness, unspecified: Secondary | ICD-10-CM | POA: Diagnosis not present

## 2022-06-01 DIAGNOSIS — F411 Generalized anxiety disorder: Secondary | ICD-10-CM

## 2022-06-01 NOTE — Assessment & Plan Note (Signed)
Chronic. Followed by Cardiology. Continue with Rosuvastatin daily.  Labs ordered. Follow up in 3 months.  Call sooner if concerns arise.

## 2022-06-01 NOTE — Assessment & Plan Note (Signed)
Chronic. Followed by cardiology.  Continue to follow per their recommendations.

## 2022-06-01 NOTE — Assessment & Plan Note (Signed)
Chronic. Ongoing.  Rarely uses Xanax.  Does not need refill of Xanax today.  30 pills should last around 45 days.  Will refill before next appt.  Follow up in 3 months for reevaluation.

## 2022-06-01 NOTE — Progress Notes (Signed)
BP 119/74   Pulse 73   Temp 98.7 F (37.1 C) (Oral)   Wt 191 lb 12.8 oz (87 kg)   SpO2 96%   BMI 30.96 kg/m    Subjective:    Patient ID: Tammy Guerra, female    DOB: 1950-09-17, 72 y.o.   MRN: [id number]  HPI: Tammy Guerra is a 72 y.o. female  Chief Complaint  Patient presents with   Diabetes   Hyperlipidemia   Hypertension   COPD Stopped smoking 12/23/20. COPD status: controlled Satisfied with current treatment?: yes Oxygen use: no Dyspnea frequency:  not really Cough frequency: no Rescue inhaler frequency:   Limitation of activity: no Productive cough: no Last Spirometry:  Pneumovax: Up to Date Influenza: Not up to Date  DIABETES Hypoglycemic episodes:no Polydipsia/polyuria: no Visual disturbance: no Chest pain: no Paresthesias: no Glucose Monitoring: no  Accucheck frequency: not checking  Fasting glucose:   Post prandial:  Evening:  Before meals: Taking Insulin?: no  Long acting insulin:  Short acting insulin: Blood Pressure Monitoring: not checking Retinal Examination: Up to Date Foot Exam: Not up to Date Diabetic Education: Not Completed Pneumovax: up to date Influenza: Not up to Date Aspirin: no  ANXIETY Patient states her anxiety has been about the same. She hasn't been using the xanax but feels like she could use it.  She rarely uses the xanax.  But having them on hand if she does need them decreases her anxiety. She is still doing well with the trazodone at bedtime.     Denies HA, CP, SOB, palpitations, visual changes, and lower extremity swelling.       Relevant past medical, surgical, family and social history reviewed and updated as indicated. Interim medical history since our last visit reviewed. Allergies and medications reviewed and updated.  Review of Systems  HENT:  Negative for congestion.        Smelling smoke   Eyes:  Negative for visual disturbance.  Respiratory:  Negative for cough, chest tightness and  shortness of breath.   Cardiovascular:  Negative for chest pain, palpitations and leg swelling.  Musculoskeletal:        Right knee pain  Neurological:  Negative for dizziness and headaches.  Psychiatric/Behavioral:  The patient is nervous/anxious.     Per HPI unless specifically indicated above     Objective:    BP 119/74   Pulse 73   Temp 98.7 F (37.1 C) (Oral)   Wt 191 lb 12.8 oz (87 kg)   SpO2 96%   BMI 30.96 kg/m   Wt Readings from Last 3 Encounters:  06/01/22 191 lb 12.8 oz (87 kg)  03/01/22 197 lb 14.4 oz (89.8 kg)  02/22/22 200 lb (90.7 kg)    Physical Exam Vitals and nursing note reviewed.  Constitutional:      General: She is not in acute distress.    Appearance: Normal appearance. She is normal weight. She is not ill-appearing, toxic-appearing or diaphoretic.  HENT:     Head: Normocephalic.     Right Ear: External ear normal. A middle ear effusion is present.     Left Ear: External ear normal. A middle ear effusion is present.     Nose: Nose normal.     Mouth/Throat:     Mouth: Mucous membranes are moist.     Pharynx: Oropharynx is clear.  Eyes:     General:        Right eye: No discharge.  Left eye: No discharge.     Extraocular Movements: Extraocular movements intact.     Conjunctiva/sclera: Conjunctivae normal.     Pupils: Pupils are equal, round, and reactive to light.  Cardiovascular:     Rate and Rhythm: Normal rate and regular rhythm.     Heart sounds: No murmur heard. Pulmonary:     Effort: Pulmonary effort is normal. No respiratory distress.     Breath sounds: Normal breath sounds. No wheezing or rales.  Musculoskeletal:     Cervical back: Normal range of motion and neck supple.     Comments: Swelling noted on lateral side of right knee  Skin:    General: Skin is warm and dry.     Capillary Refill: Capillary refill takes less than 2 seconds.  Neurological:     General: No focal deficit present.     Mental Status: She is alert and  oriented to person, place, and time. Mental status is at baseline.  Psychiatric:        Mood and Affect: Mood normal.        Behavior: Behavior normal.        Thought Content: Thought content normal.        Judgment: Judgment normal.     Results for orders placed or performed in visit on 03/01/22  WET PREP FOR [place], YEAST, CLUE   Specimen: Sterile Swab   Sterile Swab  Result Value Ref Range   Trichomonas Exam Negative Negative   Yeast Exam Negative Negative   Clue Cell Exam Positive (A) Negative  Urine Culture   Specimen: Urine   UR  Result Value Ref Range   Urine Culture, Routine Final report    Organism ID, Bacteria Comment   Microscopic Examination   Urine  Result Value Ref Range   WBC, UA 0-5 0 - 5 /hpf   RBC, Urine 0-2 0 - 2 /hpf   Epithelial Cells (non renal) 0-10 0 - 10 /hpf   Crystals Present (A) N/A   Crystal Type Calcium Oxalate N/A   Mucus, UA Present (A) Not Estab.   Bacteria, UA None seen None seen/Few  Comp Met (CMET)  Result Value Ref Range   Glucose 169 (H) 70 - 99 mg/dL   BUN 14 8 - 27 mg/dL   Creatinine, Ser 0.68 0.57 - 1.00 mg/dL   eGFR 94 >59 mL/min/1.73   BUN/Creatinine Ratio 21 12 - 28   Sodium 139 134 - 144 mmol/L   Potassium 4.0 3.5 - 5.2 mmol/L   Chloride 100 96 - 106 mmol/L   CO2 22 20 - 29 mmol/L   Calcium 10.6 (H) 8.7 - 10.3 mg/dL   Total Protein 6.9 6.0 - 8.5 g/dL   Albumin 4.9 3.9 - 4.9 g/dL   Globulin, Total 2.0 1.5 - 4.5 g/dL   Albumin/Globulin Ratio 2.5 (H) 1.2 - 2.2   Bilirubin Total 0.3 0.0 - 1.2 mg/dL   Alkaline Phosphatase 90 44 - 121 IU/L   AST 14 0 - 40 IU/L   ALT 12 0 - 32 IU/L  Lipid Profile  Result Value Ref Range   Cholesterol, Total 108 100 - 199 mg/dL   Triglycerides 99 0 - 149 mg/dL   HDL 48 >39 mg/dL   VLDL Cholesterol Cal 19 5 - 40 mg/dL   LDL Chol Calc (NIH) 41 0 - 99 mg/dL   Chol/HDL Ratio 2.3 0.0 - 4.4 ratio  Hepatitis C Antibody  Result Value Ref Range   Hep C  Virus Ab Non Reactive Non Reactive  HgB  A1c  Result Value Ref Range   Hgb A1c MFr Bld 7.9 (H) 4.8 - 5.6 %   Est. average glucose Bld gHb Est-mCnc 180 mg/dL  Urinalysis, Routine w reflex microscopic  Result Value Ref Range   Specific Gravity, UA >1.030 (H) 1.005 - 1.030   pH, UA 5.5 5.0 - 7.5   Color, UA Yellow Yellow   Appearance Ur Clear Clear   Leukocytes,UA Trace (A) Negative   Protein,UA 1+ (A) Negative/Trace   Glucose, UA Negative Negative   Ketones, UA Trace (A) Negative   RBC, UA Negative Negative   Bilirubin, UA Negative Negative   Urobilinogen, Ur 1.0 0.2 - 1.0 mg/dL   Nitrite, UA Negative Negative   Microscopic Examination See below:       Assessment & Plan:   Problem List Items Addressed This Visit       Cardiovascular and Mediastinum   PAD (peripheral artery disease) (HCC)    Chronic. Followed by cardiology.  Continue to follow per their recommendations.       Relevant Orders   Comp Met (CMET)   Aortic atherosclerosis ([place]) - Primary    Chronic. Followed by Cardiology. Continue with Rosuvastatin daily.  Labs ordered. Follow up in 3 months.  Call sooner if concerns arise.      Relevant Orders   Comp Met (CMET)   Coronary artery disease    Chronic. Followed by Cardiology. Continue with Rosuvastatin daily.  Labs ordered. Follow up in 3 months.  Call sooner if concerns arise.      Relevant Orders   Comp Met (CMET)     Respiratory   Emphysema lung (HCC)    Chronic.  Controlled.  Continue with current medication regimen.  Labs ordered today.  Return to clinic in 3 months for reevaluation.  Call sooner if concerns arise. Smoking cessation since September 2022.  Encouraged patient to continue with smoking cessation.         Endocrine   Diabetes mellitus type 2 with complications (HCC)    Chronic.  Controlled.  Continue with current medication regimen.  Information for PAP brought in office today for [place] to complete.  Last A1c was 7.9%.  Can increase dose of Ozempic from 0.63m to 170mweekly if A1c  is elevated.  Labs ordered today.  Return to clinic in 3 months for reevaluation.  Call sooner if concerns arise.        Relevant Orders   HgB A1c   Urine Microalbumin w/creat. ratio     Other   Generalized anxiety disorder    Chronic. Ongoing.  Rarely uses Xanax.  Does not need refill of Xanax today.  30 pills should last around 45 days.  Will refill before next appt.  Follow up in 3 months for reevaluation.         Follow up plan: Return in about 3 months (around 08/30/2022) for HTN, HLD, DM2 FU.

## 2022-06-01 NOTE — Assessment & Plan Note (Signed)
Chronic.  Controlled.  Continue with current medication regimen.  Labs ordered today.  Return to clinic in 3 months for reevaluation.  Call sooner if concerns arise. Smoking cessation since September 2022.  Encouraged patient to continue with smoking cessation.

## 2022-06-01 NOTE — Assessment & Plan Note (Signed)
Chronic.  Controlled.  Continue with current medication regimen.  Information for PAP brought in office today for [place] to complete.  Last A1c was 7.9%.  Can increase dose of Ozempic from 0.4m to 128mweekly if A1c is elevated.  Labs ordered today.  Return to clinic in 3 months for reevaluation.  Call sooner if concerns arise.

## 2022-06-05 ENCOUNTER — Encounter: Payer: Self-pay | Admitting: Nurse Practitioner

## 2022-06-05 MED ORDER — NITROFURANTOIN MONOHYD MACRO 100 MG PO CAPS: 100.0000 mg | ORAL_CAPSULE | Freq: Two times a day (BID) | ORAL | 0 refills | Status: DC

## 2022-06-05 NOTE — Addendum Note (Signed)
Addended by: [name] on: 06/05/2022 08:03 AM   Modules accepted: Orders

## 2022-06-05 NOTE — Progress Notes (Signed)
Hi Tammy Guerra. It was good to see you last week.  Your lab work shows that your A1c improved some to 7.5% which is great news.  Your urine did show some bacterial growth.  I sent in a prescription for macrobid to treat this UTI.

## 2022-06-07 LAB — COMPREHENSIVE METABOLIC PANEL
ALT: 16 IU/L (ref 0–32)
AST: 12 IU/L (ref 0–40)
Albumin/Globulin Ratio: 2 (ref 1.2–2.2)
Albumin: 4.3 g/dL (ref 3.9–4.9)
Alkaline Phosphatase: 77 IU/L (ref 44–121)
BUN/Creatinine Ratio: 20 (ref 12–28)
BUN: 12 mg/dL (ref 8–27)
Bilirubin Total: 0.3 mg/dL (ref 0.0–1.2)
CO2: 22 mmol/L (ref 20–29)
Calcium: 9.2 mg/dL (ref 8.7–10.3)
Chloride: 102 mmol/L (ref 96–106)
Creatinine, Ser: 0.6 mg/dL (ref 0.57–1.00)
Globulin, Total: 2.1 g/dL (ref 1.5–4.5)
Glucose: 164 mg/dL — ABNORMAL HIGH (ref 70–99)
Potassium: 3.9 mmol/L (ref 3.5–5.2)
Sodium: 139 mmol/L (ref 134–144)
Total Protein: 6.4 g/dL (ref 6.0–8.5)
eGFR: 96 mL/min/{1.73_m2} (ref >59)

## 2022-06-07 LAB — GLU+CBC/D/PLT+AB SCR+HB A1C...
Antibody Screen: NEGATIVE
Basophils Absolute: 0.1 10*3/uL (ref 0.0–0.2)
Basos: 1 %
EOS (ABSOLUTE): 0.1 10*3/uL (ref 0.0–0.4)
Eos: 1 %
Gestational Diabetes Screen: 163 mg/dL — ABNORMAL HIGH (ref 70–139)
Hematocrit: 38.5 % (ref 34.0–46.6)
Hemoglobin: 12.9 g/dL (ref 11.1–15.9)
Hgb A1c MFr Bld: 7.5 % — ABNORMAL HIGH (ref 4.8–5.6)
Immature Grans (Abs): 0 10*3/uL (ref 0.0–0.1)
Immature Granulocytes: 0 %
Lymphocytes Absolute: 2.2 10*3/uL (ref 0.7–3.1)
Lymphs: 23 %
MCH: 30.8 pg (ref 26.6–33.0)
MCHC: 33.5 g/dL (ref 31.5–35.7)
MCV: 92 fL (ref 79–97)
Monocytes Absolute: 0.6 10*3/uL (ref 0.1–0.9)
Monocytes: 6 %
Neutrophils Absolute: 6.7 10*3/uL (ref 1.4–7.0)
Neutrophils: 69 %
Platelets: 339 10*3/uL (ref 150–450)
RBC: 4.19 x10E6/uL (ref 3.77–5.28)
RDW: 13 % (ref 11.7–15.4)
WBC: 9.6 10*3/uL (ref 3.4–10.8)

## 2022-06-07 LAB — HEMOGLOBIN A1C: Est. average glucose Bld gHb Est-mCnc: 169 mg/dL

## 2022-06-07 NOTE — Progress Notes (Signed)
Hi Tammy Guerra.  Your urine did grow bacteria which means you had a UTI.  The antibiotic I treated you with should take care of the bacteria.

## 2022-06-18 ENCOUNTER — Telehealth: Payer: Self-pay | Admitting: Nurse Practitioner

## 2022-06-18 NOTE — Telephone Encounter (Signed)
Pt came and dropped off Parking Placard to be filled out by provider.  Placed in provider's folder.

## 2022-06-19 NOTE — Telephone Encounter (Signed)
Form completed and signed. Called and LVM notifying patient that the form is ready for pick up.   Copy placed in scan bin and original placed in bin for pick up.

## 2022-06-19 NOTE — Telephone Encounter (Signed)
Form filled out and placed in providers folder for signature.

## 2022-06-21 MED ORDER — ALPRAZOLAM 0.25 MG PO TABS: ORAL_TABLET | ORAL | 0 refills | Status: DC

## 2022-06-26 ENCOUNTER — Telehealth: Payer: Self-pay

## 2022-06-26 NOTE — Progress Notes (Cosign Needed)
Note mark as in error patient not on list

## 2022-07-02 DIAGNOSIS — L298 Other pruritus: Secondary | ICD-10-CM | POA: Diagnosis not present

## 2022-07-02 DIAGNOSIS — L814 Other melanin hyperpigmentation: Secondary | ICD-10-CM | POA: Diagnosis not present

## 2022-07-02 DIAGNOSIS — L570 Actinic keratosis: Secondary | ICD-10-CM | POA: Diagnosis not present

## 2022-07-02 DIAGNOSIS — L578 Other skin changes due to chronic exposure to nonionizing radiation: Secondary | ICD-10-CM | POA: Diagnosis not present

## 2022-07-02 DIAGNOSIS — L821 Other seborrheic keratosis: Secondary | ICD-10-CM | POA: Diagnosis not present

## 2022-07-02 DIAGNOSIS — Z859 Personal history of malignant neoplasm, unspecified: Secondary | ICD-10-CM | POA: Diagnosis not present

## 2022-07-05 ENCOUNTER — Telehealth: Payer: Self-pay

## 2022-07-05 DIAGNOSIS — H35341 Macular cyst, hole, or pseudohole, right eye: Secondary | ICD-10-CM | POA: Diagnosis not present

## 2022-07-05 NOTE — Telephone Encounter (Signed)
5 boxes of Ozempic received for the patient.   Called and LVM letting her know that it was ready to be picked up.

## 2022-07-05 NOTE — Telephone Encounter (Signed)
Pt came by and picked up 5 boxes of Ozempic.

## 2022-07-09 ENCOUNTER — Other Ambulatory Visit: Payer: Self-pay | Admitting: Nurse Practitioner

## 2022-07-10 MED ORDER — ROSUVASTATIN CALCIUM 20 MG PO TABS: 20.0000 mg | ORAL_TABLET | Freq: Every day | ORAL | 1 refills | Status: DC

## 2022-07-10 NOTE — Telephone Encounter (Signed)
Unable to refill per protocol, Rx request is too soon. Last refill 07/10/22 for 90 days.  Requested Prescriptions  Pending Prescriptions Disp Refills   rosuvastatin (CRESTOR) 20 MG tablet [Pharmacy Med Name: Rosuvastatin Calcium 20 MG Oral Tablet] 90 tablet 0    Sig: TAKE 1 TABLET BY MOUTH AT BEDTIME     Cardiovascular:  Antilipid - Statins 2 Failed - 07/09/2022  6:31 AM      Failed - Lipid Panel in normal range within the last 12 months    Cholesterol, Total  Date Value Ref Range Status  03/01/2022 108 100 - 199 mg/dL Final   LDL Chol Calc (NIH)  Date Value Ref Range Status  03/01/2022 41 0 - 99 mg/dL Final   HDL  Date Value Ref Range Status  03/01/2022 48 >39 mg/dL Final   Triglycerides  Date Value Ref Range Status  03/01/2022 99 0 - 149 mg/dL Final         Passed - Cr in normal range and within 360 days    Creatinine, Ser  Date Value Ref Range Status  06/01/2022 0.60 0.57 - 1.00 mg/dL Final         Passed - Patient is not pregnant      Passed - Valid encounter within last 12 months    Recent Outpatient Visits           1 month ago Aortic atherosclerosis ([place])   [place] [name], NP   2 months ago [place], Karen, NP   4 months ago Accelerating angina [facility])   [place] [name], NP   8 months ago Annual physical exam   [place] [name], NP   1 year ago Pulmonary emphysema, unspecified emphysema type [facility])   [place] [name], NP       Future Appointments             In 1 month Fletcher Anon, Mertie Clause, MD [place] [facility] [place]   In 1 month [name], NP [place], [place]

## 2022-07-17 ENCOUNTER — Telehealth: Payer: Self-pay | Admitting: Nurse Practitioner

## 2022-07-17 NOTE — Telephone Encounter (Signed)
Copied from [place] [id number]. Topic: Medicare AWV >> Jul 17, 2022  1:26 PM Devoria Glassing wrote: Reason for CRM: Called patient to schedule Medicare Annual Wellness Visit (AWV). Left message for patient to call back and schedule Medicare Annual Wellness Visit (AWV).  Last date of AWV: 08/09/21  Please schedule an appointment at any time with Kirke Shaggy, LPN  .  If any questions, please contact me.  Thank you ,  Sherol Dade; [place] Direct Dial: [phone]

## 2022-08-14 ENCOUNTER — Ambulatory Visit (INDEPENDENT_AMBULATORY_CARE_PROVIDER_SITE_OTHER): Payer: Medicare HMO

## 2022-08-14 VITALS — Ht 66.0 in | Wt 191.0 lb

## 2022-08-14 DIAGNOSIS — Z0000 Encounter for general adult medical examination without abnormal findings: Secondary | ICD-10-CM | POA: Diagnosis not present

## 2022-08-14 NOTE — Patient Instructions (Signed)
[name] , Thank you for taking time to come for your Medicare Wellness Visit. I appreciate your ongoing commitment to your health goals. Please review the following plan we discussed and let me know if I can assist you in the future.   These are the goals we discussed:  Goals      DIET - EAT MORE FRUITS AND VEGETABLES     Patient Stated     08/01/2020, wants to weigh 155-159 and quit smoking     Patient Stated     Loose weight        This is a list of the screening recommended for you and due dates:  Health Maintenance  Topic Date Due   DTaP/Tdap/Td vaccine (1 - Tdap) Never done   COVID-19 Vaccine (3 - Pfizer risk series) 08/06/2019   Yearly kidney health urinalysis for diabetes  07/01/2021   Zoster (Shingles) Vaccine (1 of 2) 08/30/2022*   Mammogram  11/07/2022   Flu Shot  11/22/2022   Screening for Lung Cancer  11/28/2022   Hemoglobin A1C  11/30/2022   Eye exam for diabetics  02/13/2023   Yearly kidney function blood test for diabetes  06/02/2023   Complete foot exam   06/02/2023   Medicare Annual Wellness Visit  08/14/2023   Colon Cancer Screening  11/16/2029   Pneumonia Vaccine  Completed   DEXA scan (bone density measurement)  Completed   Hepatitis C Screening: USPSTF Recommendation to screen - Ages 66-79 yo.  Completed   HPV Vaccine  Aged Out  *Topic was postponed. The date shown is not the original due date.    Advanced directives: no  Conditions/risks identified: none  Next appointment: Follow up in one year for your annual wellness visit 08/20/23 @ 10:45 am by phone   Preventive Care 65 Years and Older, Female Preventive care refers to lifestyle choices and visits with your health care provider that can promote health and wellness. What does preventive care include? A yearly physical exam. This is also called an annual well check. Dental exams once or twice a year. Routine eye exams. Ask your health care provider how often you should have your eyes  checked. Personal lifestyle choices, including: Daily care of your teeth and gums. Regular physical activity. Eating a healthy diet. Avoiding tobacco and drug use. Limiting alcohol use. Practicing safe sex. Taking low-dose aspirin every day. Taking vitamin and mineral supplements as recommended by your health care provider. What happens during an annual well check? The services and screenings done by your health care provider during your annual well check will depend on your age, overall health, lifestyle risk factors, and family history of disease. Counseling  Your health care provider may ask you questions about your: Alcohol use. Tobacco use. Drug use. Emotional well-being. Home and relationship well-being. Sexual activity. Eating habits. History of falls. Memory and ability to understand (cognition). Work and work Astronomer. Reproductive health. Screening  You may have the following tests or measurements: Height, weight, and BMI. Blood pressure. Lipid and cholesterol levels. These may be checked every 5 years, or more frequently if you are over 63 years old. Skin check. Lung cancer screening. You may have this screening every year starting at age 62 if you have a 30-pack-year history of smoking and currently smoke or have quit within the past 15 years. Fecal occult blood test (FOBT) of the stool. You may have this test every year starting at age 5. Flexible sigmoidoscopy or colonoscopy. You may have a  sigmoidoscopy every 5 years or a colonoscopy every 10 years starting at age 56. Hepatitis C blood test. Hepatitis B blood test. Sexually transmitted disease (STD) testing. Diabetes screening. This is done by checking your blood sugar (glucose) after you have not eaten for a while (fasting). You may have this done every 1-3 years. Bone density scan. This is done to screen for osteoporosis. You may have this done starting at age 21. Mammogram. This may be done every 1-2  years. Talk to your health care provider about how often you should have regular mammograms. Talk with your health care provider about your test results, treatment options, and if necessary, the need for more tests. Vaccines  Your health care provider may recommend certain vaccines, such as: Influenza vaccine. This is recommended every year. Tetanus, diphtheria, and acellular pertussis (Tdap, Td) vaccine. You may need a Td booster every 10 years. Zoster vaccine. You may need this after age 35. Pneumococcal 13-valent conjugate (PCV13) vaccine. One dose is recommended after age 30. Pneumococcal polysaccharide (PPSV23) vaccine. One dose is recommended after age 27. Talk to your health care provider about which screenings and vaccines you need and how often you need them. This information is not intended to replace advice given to you by your health care provider. Make sure you discuss any questions you have with your health care provider. Document Released: 05/06/2015 Document Revised: 12/28/2015 Document Reviewed: 02/08/2015 Elsevier Interactive Patient Education  2017 [employer] Prevention in the Home Falls can cause injuries. They can happen to people of all ages. There are many things you can do to make your home safe and to help prevent falls. What can I do on the outside of my home? Regularly fix the edges of walkways and driveways and fix any cracks. Remove anything that might make you trip as you walk through a door, such as a raised step or threshold. Trim any bushes or trees on the path to your home. Use bright outdoor lighting. Clear any walking paths of anything that might make someone trip, such as rocks or tools. Regularly check to see if handrails are loose or broken. Make sure that both sides of any steps have handrails. Any raised decks and porches should have guardrails on the edges. Have any leaves, snow, or ice cleared regularly. Use sand or salt on walking paths  during winter. Clean up any spills in your garage right away. This includes oil or grease spills. What can I do in the bathroom? Use night lights. Install grab bars by the toilet and in the tub and shower. Do not use towel bars as grab bars. Use non-skid mats or decals in the tub or shower. If you need to sit down in the shower, use a plastic, non-slip stool. Keep the floor dry. Clean up any water that spills on the floor as soon as it happens. Remove soap buildup in the tub or shower regularly. Attach bath mats securely with double-sided non-slip rug tape. Do not have throw rugs and other things on the floor that can make you trip. What can I do in the bedroom? Use night lights. Make sure that you have a light by your bed that is easy to reach. Do not use any sheets or blankets that are too big for your bed. They should not hang down onto the floor. Have a firm chair that has side arms. You can use this for support while you get dressed. Do not have throw rugs and other  things on the floor that can make you trip. What can I do in the kitchen? Clean up any spills right away. Avoid walking on wet floors. Keep items that you use a lot in easy-to-reach places. If you need to reach something above you, use a strong step stool that has a grab bar. Keep electrical cords out of the way. Do not use floor polish or wax that makes floors slippery. If you must use wax, use non-skid floor wax. Do not have throw rugs and other things on the floor that can make you trip. What can I do with my stairs? Do not leave any items on the stairs. Make sure that there are handrails on both sides of the stairs and use them. Fix handrails that are broken or loose. Make sure that handrails are as long as the stairways. Check any carpeting to make sure that it is firmly attached to the stairs. Fix any carpet that is loose or worn. Avoid having throw rugs at the top or bottom of the stairs. If you do have throw  rugs, attach them to the floor with carpet tape. Make sure that you have a light switch at the top of the stairs and the bottom of the stairs. If you do not have them, ask someone to add them for you. What else can I do to help prevent falls? Wear shoes that: Do not have high heels. Have rubber bottoms. Are comfortable and fit you well. Are closed at the toe. Do not wear sandals. If you use a stepladder: Make sure that it is fully opened. Do not climb a closed stepladder. Make sure that both sides of the stepladder are locked into place. Ask someone to hold it for you, if possible. Clearly mark and make sure that you can see: Any grab bars or handrails. First and last steps. Where the edge of each step is. Use tools that help you move around (mobility aids) if they are needed. These include: Canes. Walkers. Scooters. Crutches. Turn on the lights when you go into a dark area. Replace any light bulbs as soon as they burn out. Set up your furniture so you have a clear path. Avoid moving your furniture around. If any of your floors are uneven, fix them. If there are any pets around you, be aware of where they are. Review your medicines with your doctor. Some medicines can make you feel dizzy. This can increase your chance of falling. Ask your doctor what other things that you can do to help prevent falls. This information is not intended to replace advice given to you by your health care provider. Make sure you discuss any questions you have with your health care provider. Document Released: 02/03/2009 Document Revised: 09/15/2015 Document Reviewed: 05/14/2014 Elsevier Interactive Patient Education  2017 [employer].

## 2022-08-14 NOTE — Progress Notes (Signed)
I connected with  Tammy Guerra on 08/14/22 by a audio enabled telemedicine application and verified that I am speaking with the correct person using two identifiers.  Patient Location: Home  Provider Location: Office/Clinic  I discussed the limitations of evaluation and management by telemedicine. The patient expressed understanding and agreed to proceed.  Subjective:   Tammy Guerra is a 72 y.o. female who presents for Medicare Annual (Subsequent) preventive examination.  Review of Systems     Cardiac Risk Factors include: advanced age (>67men, >33 women);diabetes mellitus;family history of premature cardiovascular disease     Objective:    There were no vitals filed for this visit. There is no height or weight on file to calculate BMI.     08/14/2022   11:33 AM 09/08/2021    8:53 AM 09/01/2021   12:00 PM 08/09/2021    8:37 AM 08/01/2020   10:35 AM 08/20/2019    9:17 AM 05/27/2018    9:54 AM  Advanced Directives  Does Patient Have a Medical Advance Directive? No No No No No No No  Would patient like information on creating a medical advance directive? No - Patient declined Yes (MAU/Ambulatory/Procedural Areas - Information given) No - Patient declined No - Patient declined  No - Patient declined Yes (MAU/Ambulatory/Procedural Areas - Information given)    Current Medications (verified) Outpatient Encounter Medications as of 08/14/2022  Medication Sig   ALPRAZolam (XANAX) 0.25 MG tablet Take 1 tablet as needed for anxiety.   aspirin EC 81 MG tablet Take 81 mg by mouth daily. Swallow whole.   Blood Glucose Monitoring Suppl (ONE TOUCH ULTRA 2) w/Device KIT 1 each by Does not apply route daily.   cilostazol (PLETAL) 100 MG tablet Take 1 tablet by mouth twice daily   ezetimibe (ZETIA) 10 MG tablet Take 1 tablet by mouth once daily   glucose blood (ONETOUCH ULTRA) test strip TEST BLOOD SUGAR EVERY DAY   lidocaine (XYLOCAINE) 2 % solution Use as directed 15 mLs in the mouth or  throat as needed for mouth pain.   lisinopril (ZESTRIL) 2.5 MG tablet Take 1 tablet by mouth once daily   metFORMIN (GLUCOPHAGE-XR) 500 MG 24 hr tablet TAKE 2 TABLETS BY MOUTH DAILY WITH DINNER.   rosuvastatin (CRESTOR) 20 MG tablet Take 1 tablet (20 mg total) by mouth daily.   Semaglutide,0.25 or 0.5MG /DOS, (OZEMPIC, 0.25 OR 0.5 MG/DOSE,) 2 MG/1.5ML SOPN Inject 0.5 mg into the skin once a week. Start with 0.25MG  once a week x 4 weeks, then increase to 0.5MG  weekly.   traZODone (DESYREL) 50 MG tablet TAKE 1 TABLET (50 MG TOTAL) BY MOUTH AT BEDTIME AS NEEDED FOR SLEEP (CAN TAKE 1-2 TABS AS NEEDED ).   nitrofurantoin, macrocrystal-monohydrate, (MACROBID) 100 MG capsule Take 1 capsule (100 mg total) by mouth 2 (two) times daily. (Patient not taking: Reported on 08/14/2022)   No facility-administered encounter medications on file as of 08/14/2022.    Allergies (verified) Patient has no known allergies.   History: Past Medical History:  Diagnosis Date   Anxiety    Arthritis    Cancer    skin/CERVICAL CA   Complication of anesthesia    discomfort during first cataract   Diabetes mellitus without complication    Emphysema lung 07/01/2020   Emphysema lung 07/01/2020   GERD (gastroesophageal reflux disease)    NO MEDS   History of kidney stones    STONES AND CYSTS   Kidney stone    Palpitations  Pancreatic cyst    Past Surgical History:  Procedure Laterality Date   CARDIAC CATHETERIZATION     CATARACT EXTRACTION W/PHACO Left 09/07/2014   Procedure: CATARACT EXTRACTION PHACO AND INTRAOCULAR LENS PLACEMENT (IOC);  Surgeon: Galen Manila, MD;  Location: [facility] ORS;  Service: Ophthalmology;  Laterality: Left;  [place] 01:03 AP% 27.7 CDE 17.55   CORONARY ANGIOPLASTY     EYE SURGERY     cataract   KNEE ARTHROSCOPY WITH MEDIAL MENISECTOMY Right 05/30/2017   Procedure: KNEE ARTHROSCOPY WITH MEDIAL AND LATERAL  MENISECTOMY;  Surgeon: Kennedy Bucker, MD;  Location: [facility] ORS;  Service:  Orthopedics;  Laterality: Right;   LEFT HEART CATH AND CORONARY ANGIOGRAPHY N/A 09/08/2021   Procedure: LEFT HEART CATH AND CORONARY ANGIOGRAPHY;  Surgeon: Iran Ouch, MD;  Location: [facility] IN[facility]IVE [facility];  Service: Cardiovascular;  Laterality: N/A;   SYNOVECTOMY Right 05/30/2017   Procedure: SYNOVECTOMY;  Surgeon: Kennedy Bucker, MD;  Location: [facility] OR[facility];  Service: Orthopedics;  Laterality: Right;   TOTAL ABDOMINAL HYSTERECTOMY W/ BILATERAL SALPINGOOPHORECTOMY     Family History  Problem Relation Age of Onset   Diabetes Father    Alcohol abuse Father    Early death Father    Heart disease Father    Stroke Father    Diabetes Brother    Alcohol abuse Brother    Early death Brother    Heart disease Brother    Stroke Brother    Diabetes Brother    Cancer Brother    Early death Brother    Early death Brother    Cancer Paternal Aunt        Breast   Breast cancer Neg Hx    Social History   Socioeconomic History   Marital status: Widowed    Spouse name: Not on file   Number of children: Not on file   Years of education: Not on file   Highest education level: Not on file  Occupational History   Not on file  Tobacco Use   Smoking status: Former    Packs/day: 0.50    Years: 41.00    Additional pack years: 0.00    Total pack years: 20.50    Types: Cigarettes    Quit date: 12/19/2020    Years since quitting: 1.6   Smokeless tobacco: Never   Tobacco comments:    15 CIG DAILY  Vaping Use   Vaping Use: Never used  Substance and Sexual Activity   Alcohol use: No   Drug use: No   Sexual activity: Not Currently    Birth control/protection: Surgical  Other Topics Concern   Not on file  Social History Narrative   Lives alone.  Diane, daughter at bedside.   Social Determinants of Health   Financial Resource Strain: Low Risk  (08/14/2022)   Overall Financial Resource Strain (CARDIA)    Difficulty of Paying Living Expenses: Not very hard  Food Insecurity: No Food  Insecurity (08/14/2022)   Hunger Vital Sign    Worried About Running Out of Food in the Last Year: Never true    Ran Out of Food in the Last Year: Never true  Transportation Needs: No Transportation Needs (08/14/2022)   PRAPARE - Administrator, Civil Service (Medical): No    Lack of Transportation (Non-Medical): No  Physical Activity: Insufficiently Active (08/14/2022)   Exercise Vital Sign    Days of Exercise per Week: 2 days    Minutes of Exercise per Session: 20 min  Stress: No  Stress Concern Present (08/14/2022)   [employer] of Occupational Health - Occupational Stress Questionnaire    Feeling of Stress : Only a little  Social Connections: Moderately Isolated (08/14/2022)   Social Connection and Isolation Panel [NHANES]    Frequency of Communication with Friends and Family: More than three times a week    Frequency of Social Gatherings with Friends and Family: More than three times a week    Attends Religious Services: More than 4 times per year    Active Member of [employer] or Organizations: No    Attends Banker Meetings: Never    Marital Status: Widowed    Tobacco Counseling Counseling given: Not Answered Tobacco comments: 15 CIG DAILY   Clinical Intake:  Pre-visit preparation completed: Yes  Pain : No/denies pain     Nutritional Risks: None Diabetes: Yes CBG done?: No Did pt. bring in CBG monitor from home?: No  How often do you need to have someone help you when you read instructions, pamphlets, or other written materials from your doctor or pharmacy?: 1 - Never  Diabetic?yes Nutrition Risk Assessment:  Has the patient had any N/V/D within the last 2 months?  No  Does the patient have any non-healing wounds?  No  Has the patient had any unintentional weight loss or weight gain?  No   Diabetes:  Is the patient diabetic?  Yes  If diabetic, was a CBG obtained today?  No  Did the patient bring in their glucometer from home?  No  How  often do you monitor your CBG's? Once a day.   Financial Strains and Diabetes Management:  Are you having any financial strains with the device, your supplies or your medication? No .  Does the patient want to be seen by Chronic Care Management for management of their diabetes?  No  Would the patient like to be referred to a Nutritionist or for Diabetic Management?  No   Diabetic Exams:  Diabetic Eye Exam: Completed 02/12/22.  Pt has been advised about the importance in completing this exam.  Diabetic Foot Exam: Completed 06/01/22. Pt has been advised about the importance in completing this exam.   Interpreter Needed?: No  Information entered by :: Kennedy Bucker, LPN   Activities of Daily Living    08/14/2022   11:34 AM 08/11/2022   10:42 AM  In your present state of health, do you have any difficulty performing the following activities:  Hearing? 0 0  Vision? 0 0  Difficulty concentrating or making decisions? 0 0  Walking or climbing stairs? 1 1  Comment blockage in leg   Dressing or bathing? 0 0  Doing errands, shopping? 0 0  Preparing Food and eating ? N N  Using the Toilet? N N  In the past six months, have you accidently leaked urine? N Y  Do you have problems with loss of bowel control? N N  Managing your Medications? N N  Managing your Finances? N Y  Housekeeping or managing your Housekeeping? N N    Patient Care Team: Larae Grooms, NP as PCP - General (Nurse Practitioner) Iran Ouch, MD as PCP - Cardiology (Cardiology) Zettie Pho, [facility] (Pharmacist)  Indicate any recent Medical Services you may have received from other than [facility] providers in the past year (date may be approximate).     Assessment:   This is a routine wellness examination for Tammy Guerra.  Hearing/Vision screen Hearing Screening - Comments:: No aids Vision Screening - Comments::  Readers- Dr.Porfilio Has a macular hole in eye  Dietary issues and exercise activities  discussed: Current Exercise Habits: Home exercise routine, Type of exercise: walking, Time (Minutes): 20, Frequency (Times/Week): 2, Weekly Exercise (Minutes/Week): 40, Intensity: Mild   Goals Addressed             This Visit's Progress    DIET - EAT MORE FRUITS AND VEGETABLES         Depression Screen    08/14/2022   11:32 AM 06/01/2022   10:39 AM 10/16/2021    9:57 AM 08/09/2021    8:42 AM 05/10/2021    9:03 AM 08/01/2020   10:38 AM 07/14/2020    8:34 AM  PHQ 2/9 Scores  PHQ - 2 Score 1 0 1 2 2 1 1   PHQ- 9 Score 2 8 9 4 9  5     Fall Risk    08/14/2022   11:34 AM 08/11/2022   10:42 AM 06/01/2022   10:39 AM 10/16/2021    9:57 AM 08/09/2021    8:32 AM  Fall Risk   Falls in the past year? 0 0 0 0 0  Number falls in past yr: 0 0 0 0 0  Injury with Fall? 0 0 0 0 0  Risk for fall due to : No Fall Risks  No Fall Risks No Fall Risks   Follow up Falls prevention discussed;Falls evaluation completed  Falls evaluation completed Falls evaluation completed Falls evaluation completed;Education provided;Falls prevention discussed    FALL RISK PREVENTION PERTAINING TO THE HOME:  Any stairs in or around the home? Yes  If so, are there any without handrails? No  Home free of loose throw rugs in walkways, pet beds, electrical cords, etc? Yes  Adequate lighting in your home to reduce risk of falls? Yes   ASSISTIVE DEVICES UTILIZED TO PREVENT FALLS:  Life alert? No  Use of a cane, walker or w/c? Yes -cane sometimes Grab bars in the bathroom? No  Shower chair or bench in shower? Yes  Elevated toilet seat or a handicapped toilet? No    Cognitive Function:        08/14/2022   11:40 AM 08/09/2021    8:33 AM 08/01/2020   10:40 AM  6CIT Screen  What Year? 0 points 0 points 0 points  What month? 0 points 0 points 0 points  What time? 0 points 0 points 0 points  Count back from 20 0 points 0 points 0 points  Months in reverse 0 points 0 points 0 points  Repeat phrase 0 points 0 points 0  points  Total Score 0 points 0 points 0 points    Immunizations Immunization History  Administered Date(s) Administered   PFIZER(Purple Top)SARS-COV-2 Vaccination 06/18/2019, 07/09/2019   PNEUMOCOCCAL CONJUGATE-20 03/01/2022    TDAP status: Due, Education has been provided regarding the importance of this vaccine. Advised may receive this vaccine at local pharmacy or Health Dept. Aware to provide a copy of the vaccination record if obtained from local pharmacy or Health Dept. Verbalized acceptance and understanding.  Flu Vaccine status: Declined, Education has been provided regarding the importance of this vaccine but patient still declined. Advised may receive this vaccine at local pharmacy or Health Dept. Aware to provide a copy of the vaccination record if obtained from local pharmacy or Health Dept. Verbalized acceptance and understanding.  Pneumococcal vaccine status: Up to date  Covid-19 vaccine status: Completed vaccines  Qualifies for Shingles Vaccine? Yes   Zostavax completed No  Shingrix Completed?: No.    Education has been provided regarding the importance of this vaccine. Patient has been advised to call insurance company to determine out of pocket expense if they have not yet received this vaccine. Advised may also receive vaccine at local pharmacy or Health Dept. Verbalized acceptance and understanding.  Screening Tests Health Maintenance  Topic Date Due   DTaP/Tdap/Td (1 - Tdap) Never done   COVID-19 Vaccine (3 - Pfizer risk series) 08/06/2019   Diabetic kidney evaluation - Urine ACR  07/01/2021   Zoster Vaccines- Shingrix (1 of 2) 08/30/2022 (Originally 04/26/1969)   MAMMOGRAM  11/07/2022   INFLUENZA VACCINE  11/22/2022   Lung Cancer Screening  11/28/2022   HEMOGLOBIN A1C  11/30/2022   OPHTHALMOLOGY EXAM  02/13/2023   Diabetic kidney evaluation - eGFR measurement  06/02/2023   FOOT EXAM  06/02/2023   Medicare Annual Wellness (AWV)  08/14/2023   COLONOSCOPY (Pts  45-65yrs Insurance coverage will need to be confirmed)  11/16/2029   Pneumonia Vaccine 50+ Years old  Completed   DEXA SCAN  Completed   Hepatitis C Screening  Completed   HPV VACCINES  Aged Out    Health Maintenance  Health Maintenance Due  Topic Date Due   DTaP/Tdap/Td (1 - Tdap) Never done   COVID-19 Vaccine (3 - Pfizer risk series) 08/06/2019   Diabetic kidney evaluation - Urine ACR  07/01/2021    Colorectal cancer screening: Type of screening: Colonoscopy. Completed 11/17/19. Repeat every 10 years  Mammogram status: Completed 11/06/21. Repeat every year  Bone Density status: Completed 10/26/20. Results reflect: Bone density results: NORMAL. Repeat every 5 years.  Lung Cancer Screening: (Low Dose CT Chest recommended if Age 61-80 years, 30 pack-year currently smoking OR have quit w/in 15years.) does qualify.   Lung Cancer Screening Referral: done 11/27/21  Additional Screening:  Hepatitis C Screening: does qualify; Completed 03/01/22  Vision Screening: Recommended annual ophthalmology exams for early detection of glaucoma and other disorders of the eye. Is the patient up to date with their annual eye exam?  Yes  Who is the provider or what is the name of the office in which the patient attends annual eye exams? Dr.Pofilio If pt is not established with a provider, would they like to be referred to a provider to establish care? No .   Dental Screening: Recommended annual dental exams for proper oral hygiene  Community Resource Referral / Chronic Care Management: CRR required this visit?  No   CCM required this visit?  No      Plan:     I have personally reviewed and noted the following in the patient's chart:   Medical and social history Use of alcohol, tobacco or illicit drugs  Current medications and supplements including opioid prescriptions. Patient is not currently taking opioid prescriptions. Functional ability and status Nutritional status Physical  activity Advanced directives List of other physicians Hospitalizations, surgeries, and ER visits in previous 12 months Vitals Screenings to include cognitive, depression, and falls Referrals and appointments  In addition, I have reviewed and discussed with patient certain preventive protocols, quality metrics, and best practice recommendations. A written personalized care plan for preventive services as well as general preventive health recommendations were provided to patient.     Hal Hope, LPN   1/61/0960   Nurse Notes: none

## 2022-08-23 ENCOUNTER — Ambulatory Visit: Payer: Medicare HMO | Attending: Cardiovascular Disease | Admitting: Cardiovascular Disease

## 2022-08-23 ENCOUNTER — Encounter: Payer: Self-pay | Admitting: Cardiovascular Disease

## 2022-08-23 VITALS — BP 110/60 | HR 77 | Ht 65.0 in | Wt 187.0 lb

## 2022-08-23 DIAGNOSIS — I700 Atherosclerosis of aorta: Secondary | ICD-10-CM

## 2022-08-23 DIAGNOSIS — I739 Peripheral vascular disease, unspecified: Secondary | ICD-10-CM

## 2022-08-23 DIAGNOSIS — I2510 Atherosclerotic heart disease of native coronary artery without angina pectoris: Secondary | ICD-10-CM

## 2022-08-23 DIAGNOSIS — I10 Essential (primary) hypertension: Secondary | ICD-10-CM

## 2022-08-23 DIAGNOSIS — E785 Hyperlipidemia, unspecified: Secondary | ICD-10-CM

## 2022-08-23 NOTE — Patient Instructions (Signed)
Medication Instructions:   Your physician recommends that you continue on your current medications as directed. Please refer to the Current Medication list given to you today.  *If you need a refill on your cardiac medications before your next appointment, please call your pharmacy*   Lab Work:  No labs ordered today.  If you have labs (blood work) drawn today and your tests are completely normal, you will receive your results only by: MyChart Message (if you have MyChart) OR A paper copy in the mail If you have any lab test that is abnormal or we need to change your treatment, we will call you to review the results.   Testing/Procedures:  No testing ordered today.   Follow-Up: At [facility], you and your health needs are our priority.  As part of our continuing mission to provide you with exceptional heart care, we have created designated Provider Care Teams.  These Care Teams include your primary Cardiologist (physician) and Advanced Practice Providers (APPs -  Physician Assistants and Nurse Practitioners) who all work together to provide you with the care you need, when you need it.  We recommend signing up for the patient portal called "MyChart".  Sign up information is provided on this After Visit Summary.  MyChart is used to connect with patients for Virtual Visits (Telemedicine).  Patients are able to view lab/test results, encounter notes, upcoming appointments, etc.  Non-urgent messages can be sent to your provider as well.   To learn more about what you can do with MyChart, go to [email or web address].    Your next appointment:   12 month(s)  Provider:   You may see Lorine Bears, MD or one of the following Advanced Practice Providers on your designated Care Team:   Nicolasa Ducking, NP Eula Listen, PA-C Cadence Fransico Michael, PA-C Charlsie Quest, NP

## 2022-08-23 NOTE — Progress Notes (Signed)
Cardiology Office Note   Date:  08/23/2022   ID:  Tammy Guerra, DOB November 07, 1950, MRN [id number]  PCP:  Larae Grooms, NP  Cardiologist:  Dr. Kirke Corin  Chief Complaint  Patient presents with   Follow-up    6 month f/u c/o leg cramping and issues walking/back pain. Meds reviewed verbally with pt.       History of Present Illness: Tammy Guerra is a 72 y.o. female who presents for a follow-up visit regarding peripheral arterial disease and coronary artery disease.   She has past medical history of pancreatic cyst, anxiety, depression, hyperlipidemia, diabetes mellitus, tobacco use and aortic and coronary calcifications. She has known history of peripheral arterial disease with bilateral calf claudication. Noninvasive vascular studies showed an ABI of 0.92 on the right and 0.63 on the left.  Duplex showed severe stenosis of the right popliteal artery with chronically occluded left SFA throughout its whole length She was started on cilostazol and she quit smoking.  She had subsequent improvement in symptoms. She had atypical chest pain and shortness of breath and had an abnormal cardiac CTA in May of 2023.  Thus, coronary angiography was performed in May which showed mild one-vessel coronary artery disease involving the LAD which was moderately calcified.  EF was normal with mildly elevated left ventricular end-diastolic pressure.  I recommended medical therapy.  She has been doing reasonably well.  She seems to be limited by low back pain radiating to her right hip.  She was seen by orthopedic surgery and was told about lumbar disease.  She has minimal bilateral calf claudication.  No chest pain or shortness of breath.   Past Medical History:  Diagnosis Date   Anxiety    Arthritis    Cancer (HCC)    skin/CERVICAL CA   Complication of anesthesia    discomfort during first cataract   Diabetes mellitus without complication (HCC)    Emphysema lung (HCC) 07/01/2020   Emphysema  lung (HCC) 07/01/2020   GERD (gastroesophageal reflux disease)    NO MEDS   History of kidney stones    STONES AND CYSTS   Kidney stone    Palpitations    Pancreatic cyst     Past Surgical History:  Procedure Laterality Date   CARDIAC CATHETERIZATION     CATARACT EXTRACTION W/PHACO Left 09/07/2014   Procedure: CATARACT EXTRACTION PHACO AND INTRAOCULAR LENS PLACEMENT (IOC);  Surgeon: Galen Manila, MD;  Location: [facility] ORS;  Service: Ophthalmology;  Laterality: Left;  [place] 01:03 AP% 27.7 CDE 17.55   CORONARY ANGIOPLASTY     EYE SURGERY     cataract   KNEE ARTHROSCOPY WITH MEDIAL MENISECTOMY Right 05/30/2017   Procedure: KNEE ARTHROSCOPY WITH MEDIAL AND LATERAL  MENISECTOMY;  Surgeon: Kennedy Bucker, MD;  Location: [facility] ORS;  Service: Orthopedics;  Laterality: Right;   LEFT HEART CATH AND CORONARY ANGIOGRAPHY N/A 09/08/2021   Procedure: LEFT HEART CATH AND CORONARY ANGIOGRAPHY;  Surgeon: Iran Ouch, MD;  Location: [facility] IN[facility];  Service: Cardiovascular;  Laterality: N/A;   SYNOVECTOMY Right 05/30/2017   Procedure: SYNOVECTOMY;  Surgeon: Kennedy Bucker, MD;  Location: [facility] OR[facility];  Service: Orthopedics;  Laterality: Right;   TOTAL ABDOMINAL HYSTERECTOMY W/ BILATERAL SALPINGOOPHORECTOMY       Current Outpatient Medications  Medication Sig Dispense Refill   ALPRAZolam (XANAX) 0.25 MG tablet Take 1 tablet as needed for anxiety. 30 tablet 0   aspirin EC 81 MG tablet Take 81 mg by mouth daily. Swallow whole.  Blood Glucose Monitoring Suppl (ONE TOUCH ULTRA 2) w/Device KIT 1 each by Does not apply route daily. 1 kit 0   cilostazol (PLETAL) 100 MG tablet Take 1 tablet by mouth twice daily 180 tablet 2   ezetimibe (ZETIA) 10 MG tablet Take 1 tablet by mouth once daily 90 tablet 1   glucose blood (ONETOUCH ULTRA) test strip TEST BLOOD SUGAR EVERY DAY 100 strip 1   lidocaine (XYLOCAINE) 2 % solution Use as directed 15 mLs in the mouth or throat as needed for mouth pain. 100 mL 0    lisinopril (ZESTRIL) 2.5 MG tablet Take 1 tablet by mouth once daily 90 tablet 0   metFORMIN (GLUCOPHAGE-XR) 500 MG 24 hr tablet TAKE 2 TABLETS BY MOUTH DAILY WITH DINNER. 180 tablet 1   rosuvastatin (CRESTOR) 20 MG tablet Take 1 tablet (20 mg total) by mouth daily. 90 tablet 1   Semaglutide,0.25 or 0.5MG /DOS, (OZEMPIC, 0.25 OR 0.5 MG/DOSE,) 2 MG/1.5ML SOPN Inject 0.5 mg into the skin once a week. Start with 0.25MG  once a week x 4 weeks, then increase to 0.5MG  weekly. 6 mL 1   traZODone (DESYREL) 50 MG tablet TAKE 1 TABLET (50 MG TOTAL) BY MOUTH AT BEDTIME AS NEEDED FOR SLEEP (CAN TAKE 1-2 TABS AS NEEDED ). 180 tablet 1   No current facility-administered medications for this visit.    Allergies:   Patient has no known allergies.    Social History:  The patient  reports that she quit smoking about 20 months ago. Her smoking use included cigarettes. She has a 20.50 pack-year smoking history. She has never used smokeless tobacco. She reports that she does not drink alcohol and does not use drugs.   Family History:  The patient's family history includes Alcohol abuse in her brother and father; Cancer in her brother and paternal aunt; Diabetes in her brother, brother, and father; Early death in her brother, brother, brother, and father; Heart disease in her brother and father; Stroke in her brother and father.    ROS:  Please see the history of present illness.   Otherwise, review of systems are positive for none.   All other systems are reviewed and negative.    PHYSICAL EXAM: VS:  BP 110/60 (BP Location: Left Arm, Patient Position: Sitting, Cuff Size: Large)   Pulse 77   Ht 5\' 5"  (1.651 m)   Wt 187 lb (84.8 kg)   SpO2 98%   BMI 31.12 kg/m  , BMI Body mass index is 31.12 kg/m. GEN: Well nourished, well developed, in no acute distress  HEENT: normal  Neck: no JVD, carotid bruits, or masses Cardiac: RRR; no rubs, or gallops,no edema .  1 /6 systolic murmur in the aortic  area Respiratory:  clear to auscultation bilaterally, normal work of breathing GI: soft, nontender, nondistended, + BS MS: no deformity or atrophy  Skin: warm and dry, no rash Neuro:  Strength and sensation are intact Psych: euthymic mood, full affect Vascular: Distal pulses are diminished but palpable on the right.  Not palpable on the left Femoral pulses are normal bilaterally.   EKG:  EKG is ordered today. EKG showed normal sinus rhythm with low voltage.    Recent Labs: 10/16/2021: TSH 0.579 06/01/2022: ALT 16; BUN 12; Creatinine, Ser 0.60; Hemoglobin 12.9; Platelets 339; Potassium 3.9; Sodium 139    Lipid Panel    Component Value Date/Time   CHOL 108 03/01/2022 1446   TRIG 99 03/01/2022 1446   HDL 48 03/01/2022 1446  CHOLHDL 2.3 03/01/2022 1446   LDLCALC 41 03/01/2022 1446      Wt Readings from Last 3 Encounters:  08/23/22 187 lb (84.8 kg)  08/14/22 191 lb (86.6 kg)  06/01/22 191 lb 12.8 oz (87 kg)          08/24/2020    8:44 AM  PAD Screen  Previous PAD dx? No  Previous surgical procedure? No  Pain with walking? Yes  Subsides with rest? Yes  Feet/toe relief with dangling? Yes  Painful, non-healing ulcers? No  Extremities discolored? No      ASSESSMENT AND PLAN:  1.  Peripheral arterial disease: She has calf claudication that does not seem to be lifestyle limiting.  She is more limited by low back pain which does not seem to be due to peripheral arterial disease as her femoral pulses are normal.  No evidence of iliac disease.  Her low back pain seems to be due to lumbar spine etiology.   Continue cilostazol and low-dose aspirin.  Continue to stay as active as possible.  2.  Coronary artery disease involving native coronary arteries without angina: Heavily calcified LAD with mild nonobstructive disease.  Continue medical therapy.  3.  Previous tobacco use: She quit smoking last year with no relapse.  4.  Hyperlipidemia: I reviewed most recent lipid  profile done in June which showed an LDL of 41.  Continue treatment with rosuvastatin and ezetimibe.    Disposition:   FU with me in 12 months  Signed,  Lorine Bears, MD  08/23/2022 9:21 AM    [facility]

## 2022-08-27 ENCOUNTER — Telehealth: Payer: Self-pay

## 2022-08-27 NOTE — Progress Notes (Cosign Needed)
Care Management & Coordination Services Pharmacy Team  Reason for Encounter: General adherence update   Contacted patient for general health update and medication adherence call.  Unsuccessful outreach. Left voicemail for patient to return call.   Recent office visits:  06/01/22-Tammy Caren Griffins, NP (PCP) Seen for general follow up visit. Labs ordered. Follow up in 3 months.  03/01/22-Tammy Caren Griffins, NP (PCP) Seen for general follow up visit. Labs ordered. Pneumococcal vaccine given in the office. Follow up in 3 months.   Recent consult visits:  08/23/22-Muhammad A. Kirke Corin, MD (Cardiology) Seen for a 6 month follow up visit. Follow up in 12 months. 07/05/22-Bradley Girtha Rm (Ophthalmology) Notes not available.  07/02/22-Ana Benitez-Graham (Dermatology) Notes not available.  03/07/22-Jacob Darr, PA-C ([facility]) Notes not available.  Hospital visits:  None in previous 6 months  Medications: Outpatient Encounter Medications as of 08/27/2022  Medication Sig   ALPRAZolam (XANAX) 0.25 MG tablet Take 1 tablet as needed for anxiety.   aspirin EC 81 MG tablet Take 81 mg by mouth daily. Swallow whole.   Blood Glucose Monitoring Suppl (ONE TOUCH ULTRA 2) w/Device KIT 1 each by Does not apply route daily.   cilostazol (PLETAL) 100 MG tablet Take 1 tablet by mouth twice daily   ezetimibe (ZETIA) 10 MG tablet Take 1 tablet by mouth once daily   glucose blood (ONETOUCH ULTRA) test strip TEST BLOOD SUGAR EVERY DAY   lidocaine (XYLOCAINE) 2 % solution Use as directed 15 mLs in the mouth or throat as needed for mouth pain.   lisinopril (ZESTRIL) 2.5 MG tablet Take 1 tablet by mouth once daily   metFORMIN (GLUCOPHAGE-XR) 500 MG 24 hr tablet TAKE 2 TABLETS BY MOUTH DAILY WITH DINNER.   rosuvastatin (CRESTOR) 20 MG tablet Take 1 tablet (20 mg total) by mouth daily.   Semaglutide,0.25 or 0.5MG /DOS, (OZEMPIC, 0.25 OR 0.5 MG/DOSE,) 2 MG/1.5ML SOPN Inject 0.5 mg into the skin once a week. Start with  0.25MG  once a week x 4 weeks, then increase to 0.5MG  weekly.   traZODone (DESYREL) 50 MG tablet TAKE 1 TABLET (50 MG TOTAL) BY MOUTH AT BEDTIME AS NEEDED FOR SLEEP (CAN TAKE 1-2 TABS AS NEEDED ).   No facility-administered encounter medications on file as of 08/27/2022.    Recent vitals BP Readings from Last 3 Encounters:  08/23/22 110/60  06/01/22 119/74  03/01/22 121/72   Pulse Readings from Last 3 Encounters:  08/23/22 77  06/01/22 73  03/01/22 86   Wt Readings from Last 3 Encounters:  08/23/22 187 lb (84.8 kg)  08/14/22 191 lb (86.6 kg)  06/01/22 191 lb 12.8 oz (87 kg)   BMI Readings from Last 3 Encounters:  08/23/22 31.12 kg/m  08/14/22 30.83 kg/m  06/01/22 30.96 kg/m    Recent lab results    Component Value Date/Time   NA 139 06/01/2022 1101   K 3.9 06/01/2022 1101   CL 102 06/01/2022 1101   CO2 22 06/01/2022 1101   GLUCOSE 164 (H) 06/01/2022 1101   GLUCOSE 194 (H) 09/01/2021 1203   BUN 12 06/01/2022 1101   CREATININE 0.60 06/01/2022 1101   CALCIUM 9.2 06/01/2022 1101    Lab Results  Component Value Date   CREATININE 0.60 06/01/2022   EGFR 96 06/01/2022   GFRNONAA >60 09/01/2021   GFRAA >60 05/27/2017   Lab Results  Component Value Date/Time   HGBA1C 7.5 (H) 06/01/2022 11:01 AM   HGBA1C 7.9 (H) 03/01/2022 02:46 PM   MICROALBUR 10 07/01/2020 09:08 AM  Lab Results  Component Value Date   CHOL 108 03/01/2022   HDL 48 03/01/2022   LDLCALC 41 03/01/2022   TRIG 99 03/01/2022   CHOLHDL 2.3 03/01/2022    Care Gaps: Annual wellness visit in last year? Yes 08/14/22  If Diabetic: Last eye exam / retinopathy screening:02/12/22 Last diabetic foot exam:06/01/22 Last UACR: N/A  Star Rating Drugs:  Metformin 500 mg Last filled:02/20/22 90 DS, 04/10/22 90 DS Rosuvastatin 20 mg Last filled:05/01/22 90 DS, 07/11/22 90 DS Ozempic 0.25/0.5 mg Last filed:10/31/20 84 DS, 05/10/21 56 DS Lisinopril 2.5 mg Last filled:04/10/22 90 DS, 07/05/22 90  DS   Myriam Carolin Coy, RMA

## 2022-08-30 ENCOUNTER — Ambulatory Visit (INDEPENDENT_AMBULATORY_CARE_PROVIDER_SITE_OTHER): Payer: Medicare HMO | Admitting: Nurse Practitioner

## 2022-08-30 ENCOUNTER — Encounter: Payer: Self-pay | Admitting: Nurse Practitioner

## 2022-08-30 VITALS — BP 113/76 | HR 73 | Temp 97.9°F | Wt 188.9 lb

## 2022-08-30 DIAGNOSIS — E559 Vitamin D deficiency, unspecified: Secondary | ICD-10-CM | POA: Diagnosis not present

## 2022-08-30 DIAGNOSIS — I700 Atherosclerosis of aorta: Secondary | ICD-10-CM

## 2022-08-30 DIAGNOSIS — I25118 Atherosclerotic heart disease of native coronary artery with other forms of angina pectoris: Secondary | ICD-10-CM

## 2022-08-30 DIAGNOSIS — I739 Peripheral vascular disease, unspecified: Secondary | ICD-10-CM | POA: Diagnosis not present

## 2022-08-30 DIAGNOSIS — J439 Emphysema, unspecified: Secondary | ICD-10-CM | POA: Diagnosis not present

## 2022-08-30 DIAGNOSIS — F411 Generalized anxiety disorder: Secondary | ICD-10-CM

## 2022-08-30 DIAGNOSIS — E118 Type 2 diabetes mellitus with unspecified complications: Secondary | ICD-10-CM

## 2022-08-30 DIAGNOSIS — R5383 Other fatigue: Secondary | ICD-10-CM

## 2022-08-30 MED ORDER — ALPRAZOLAM 0.25 MG PO TABS: ORAL_TABLET | ORAL | 1 refills | Status: DC

## 2022-08-30 NOTE — Assessment & Plan Note (Signed)
Chronic. Ongoing.  Rarely uses Xanax.  Refilled today.  Last refill was in February.  30 pills should last around 45 days.  Will refill before next appt, if needed.  Follow up in 6 months for reevaluation.  Call sooner if concerns arise.

## 2022-08-30 NOTE — Assessment & Plan Note (Signed)
Chronic. Followed by cardiology.  Continue to follow per their recommendations. Recommend continued smoking cessation.

## 2022-08-30 NOTE — Assessment & Plan Note (Signed)
Chronic.  Controlled.  Continue with current medication regimen.  On Ozempic via PAP.  Doing well with Metformin 500mg  BID.  Last A1c was 7.5%.  Can increase dose of Ozempic from 0.5mg  to 1mg  weekly if A1c is elevated.  Labs ordered today.  Return to clinic in 6 months for reevaluation.  Call sooner if concerns arise.

## 2022-08-30 NOTE — Assessment & Plan Note (Signed)
Chronic.  Controlled.  Continue with current medication regimen.  Labs ordered today.  Return to clinic in 6 months for reevaluation.  Call sooner if concerns arise. Smoking cessation since September 2022.  Encouraged patient to continue with smoking cessation.

## 2022-08-30 NOTE — Progress Notes (Signed)
BP 113/76   Pulse 73   Temp 97.9 F (36.6 C) (Oral)   Wt 188 lb 14.4 oz (85.7 kg)   SpO2 95%   BMI 31.43 kg/m    Subjective:    Patient ID: Tammy Guerra, female    DOB: Mar 24, 1951, 72 y.o.   MRN: [id number]  HPI: Tammy Guerra is a 72 y.o. female  Chief Complaint  Patient presents with   Diabetes   Anxiety   COPD Stopped smoking 12/23/20. COPD status: controlled Satisfied with current treatment?: yes Oxygen use: no Dyspnea frequency:  only when she walks a lot Cough frequency: no Rescue inhaler frequency:   Limitation of activity: yes Productive cough: no Last Spirometry:  Pneumovax: Up to Date Influenza: Not up to Date  DIABETES On Ozempic 0.5mg  weekly and Metformin 500mg  daily.   Hypoglycemic episodes:no Polydipsia/polyuria: no Visual disturbance: no Chest pain: no Paresthesias: no Glucose Monitoring: no  Accucheck frequency: not checking  Fasting glucose:   Post prandial:  Evening:  Before meals: Taking Insulin?: no  Long acting insulin:  Short acting insulin: Blood Pressure Monitoring: not checking Retinal Examination: Up to Date Foot Exam: Not up to Date Diabetic Education: Not Completed Pneumovax: up to date Influenza: Not up to Date Aspirin: no  ANXIETY Patient states her anxiety has been higher than normal.  She has some financial issues.  She rarely uses the xanax.  But having them on hand if she does need them decreases her anxiety. She is still doing well with the trazodone at bedtime.     Denies HA, CP, SOB, palpitations, visual changes, and lower extremity swelling.  Patient states she has a macular hole in her right eye.  She will be seeing a Careers adviser on Monday.   Patient states she is having problems with fatigue.  Would like to have her vitamin D and B12.    Relevant past medical, surgical, family and social history reviewed and updated as indicated. Interim medical history since our last visit reviewed. Allergies and  medications reviewed and updated.  Review of Systems  Constitutional:  Positive for fatigue.  HENT:  Negative for congestion.           Eyes:  Negative for visual disturbance.  Respiratory:  Negative for cough, chest tightness and shortness of breath.   Cardiovascular:  Negative for chest pain, palpitations and leg swelling.  Neurological:  Negative for dizziness and headaches.  Psychiatric/Behavioral:  The patient is nervous/anxious.     Per HPI unless specifically indicated above     Objective:    BP 113/76   Pulse 73   Temp 97.9 F (36.6 C) (Oral)   Wt 188 lb 14.4 oz (85.7 kg)   SpO2 95%   BMI 31.43 kg/m   Wt Readings from Last 3 Encounters:  08/30/22 188 lb 14.4 oz (85.7 kg)  08/23/22 187 lb (84.8 kg)  08/14/22 191 lb (86.6 kg)    Physical Exam Vitals and nursing note reviewed.  Constitutional:      General: She is not in acute distress.    Appearance: Normal appearance. She is normal weight. She is not ill-appearing, toxic-appearing or diaphoretic.  HENT:     Head: Normocephalic.     Right Ear: External ear normal. A middle ear effusion is present.     Left Ear: External ear normal. A middle ear effusion is present.     Nose: Nose normal.     Mouth/Throat:     Mouth: Mucous membranes  are moist.     Pharynx: Oropharynx is clear.  Eyes:     General:        Right eye: No discharge.        Left eye: No discharge.     Extraocular Movements: Extraocular movements intact.     Conjunctiva/sclera: Conjunctivae normal.     Pupils: Pupils are equal, round, and reactive to light.  Cardiovascular:     Rate and Rhythm: Normal rate and regular rhythm.     Heart sounds: No murmur heard. Pulmonary:     Effort: Pulmonary effort is normal. No respiratory distress.     Breath sounds: Normal breath sounds. No wheezing or rales.  Musculoskeletal:     Cervical back: Normal range of motion and neck supple.     Comments: Swelling noted on lateral side of right knee  Skin:     General: Skin is warm and dry.     Capillary Refill: Capillary refill takes less than 2 seconds.  Neurological:     General: No focal deficit present.     Mental Status: She is alert and oriented to person, place, and time. Mental status is at baseline.  Psychiatric:        Mood and Affect: Mood normal.        Behavior: Behavior normal.        Thought Content: Thought content normal.        Judgment: Judgment normal.     Results for orders placed or performed in visit on 06/01/22  Comp Met (CMET)  Result Value Ref Range   Glucose 164 (H) 70 - 99 mg/dL   BUN 12 8 - 27 mg/dL   Creatinine, Ser 1.61 0.57 - 1.00 mg/dL   eGFR 96 >09 UE/AVW/0.98   BUN/Creatinine Ratio 20 12 - 28   Sodium 139 134 - 144 mmol/L   Potassium 3.9 3.5 - 5.2 mmol/L   Chloride 102 96 - 106 mmol/L   CO2 22 20 - 29 mmol/L   Calcium 9.2 8.7 - 10.3 mg/dL   Total Protein 6.4 6.0 - 8.5 g/dL   Albumin 4.3 3.9 - 4.9 g/dL   Globulin, Total 2.1 1.5 - 4.5 g/dL   Albumin/Globulin Ratio 2.0 1.2 - 2.2   Bilirubin Total 0.3 0.0 - 1.2 mg/dL   Alkaline Phosphatase 77 44 - 121 IU/L   AST 12 0 - 40 IU/L   ALT 16 0 - 32 IU/L  HgB A1c  Result Value Ref Range   Est. average glucose Bld gHb Est-mCnc 169 mg/dL  Glu+CBC/D/Plt+Ab Scr+Hb A1c...  Result Value Ref Range   Gestational Diabetes Screen 163 (H) 70 - 139 mg/dL   Hgb J1B MFr Bld 7.5 (H) 4.8 - 5.6 %   Antibody Screen Negative Negative   WBC 9.6 3.4 - 10.8 x10E3/uL   RBC 4.19 3.77 - 5.28 x10E6/uL   Hemoglobin 12.9 11.1 - 15.9 g/dL   Hematocrit 14.7 82.9 - 46.6 %   MCV 92 79 - 97 fL   MCH 30.8 26.6 - 33.0 pg   MCHC 33.5 31.5 - 35.7 g/dL   RDW 56.2 13.0 - 86.5 %   Platelets 339 150 - 450 x10E3/uL   Neutrophils 69 Not Estab. %   Lymphs 23 Not Estab. %   Monocytes 6 Not Estab. %   Eos 1 Not Estab. %   Basos 1 Not Estab. %   Neutrophils Absolute 6.7 1.4 - 7.0 x10E3/uL   Lymphocytes Absolute 2.2 0.7 - 3.1 x10E3/uL  Monocytes Absolute 0.6 0.1 - 0.9 x10E3/uL   EOS  (ABSOLUTE) 0.1 0.0 - 0.4 x10E3/uL   Basophils Absolute 0.1 0.0 - 0.2 x10E3/uL   Immature Granulocytes 0 Not Estab. %   Immature Grans (Abs) 0.0 0.0 - 0.1 x10E3/uL   Urine Culture, Routine Final report (A)    Organism ID, Bacteria Klebsiella pneumoniae (A)    Antimicrobial Susceptibility Comment       Assessment & Plan:   Problem List Items Addressed This Visit       Cardiovascular and Mediastinum   PAD (peripheral artery disease) (HCC) - Primary    Chronic. Followed by cardiology.  Continue to follow per their recommendations. Recommend continued smoking cessation.      Relevant Orders   Comp Met (CMET)   Aortic atherosclerosis (HCC)    Chronic. Followed by Cardiology. Continue with Rosuvastatin daily and Zetia.  Labs ordered. Follow up in 3 months.  Call sooner if concerns arise.      Coronary artery disease    Chronic. Followed by Cardiology. Continue with rosuvastatin and Zetia.  Labs ordered. Follow up in 3 months.  Call sooner if concerns arise.        Respiratory   Emphysema lung (HCC)    Chronic.  Controlled.  Continue with current medication regimen.  Labs ordered today.  Return to clinic in 6 months for reevaluation.  Call sooner if concerns arise. Smoking cessation since September 2022.  Encouraged patient to continue with smoking cessation.         Endocrine   Diabetes mellitus type 2 with complications (HCC)    Chronic.  Controlled.  Continue with current medication regimen.  On Ozempic via PAP.  Doing well with Metformin 500mg  BID.  Last A1c was 7.5%.  Can increase dose of Ozempic from 0.5mg  to 1mg  weekly if A1c is elevated.  Labs ordered today.  Return to clinic in 6 months for reevaluation.  Call sooner if concerns arise.        Relevant Orders   HgB A1c     Other   Generalized anxiety disorder    Chronic. Ongoing.  Rarely uses Xanax.  Refilled today.  Last refill was in February.  30 pills should last around 45 days.  Will refill before next appt, if  needed.  Follow up in 6 months for reevaluation.  Call sooner if concerns arise.       Relevant Medications   ALPRAZolam (XANAX) 0.25 MG tablet   Other Visit Diagnoses     Other fatigue       Relevant Orders   Vitamin D (25 hydroxy)   B12         Follow up plan: Return in about 6 months (around 03/02/2023) for HTN, HLD, DM2 FU.

## 2022-08-30 NOTE — Assessment & Plan Note (Signed)
Chronic. Followed by Cardiology. Continue with Rosuvastatin daily and Zetia.  Labs ordered. Follow up in 3 months.  Call sooner if concerns arise.

## 2022-08-30 NOTE — Assessment & Plan Note (Signed)
Chronic. Followed by Cardiology. Continue with rosuvastatin and Zetia.  Labs ordered. Follow up in 3 months.  Call sooner if concerns arise.

## 2022-08-31 ENCOUNTER — Telehealth: Payer: Self-pay | Admitting: Nurse Practitioner

## 2022-08-31 ENCOUNTER — Encounter: Payer: Self-pay | Admitting: Nurse Practitioner

## 2022-08-31 ENCOUNTER — Telehealth: Payer: Self-pay

## 2022-08-31 LAB — VITAMIN B12: Vitamin B-12: 252 pg/mL (ref 232–1245)

## 2022-08-31 LAB — COMPREHENSIVE METABOLIC PANEL
ALT: 17 IU/L (ref 0–32)
AST: 14 IU/L (ref 0–40)
Albumin/Globulin Ratio: 2.1 (ref 1.2–2.2)
Albumin: 4.4 g/dL (ref 3.9–4.9)
Alkaline Phosphatase: 83 IU/L (ref 44–121)
BUN/Creatinine Ratio: 12 (ref 12–28)
BUN: 8 mg/dL (ref 8–27)
Bilirubin Total: 0.3 mg/dL (ref 0.0–1.2)
CO2: 22 mmol/L (ref 20–29)
Calcium: 9.7 mg/dL (ref 8.7–10.3)
Chloride: 98 mmol/L (ref 96–106)
Creatinine, Ser: 0.65 mg/dL (ref 0.57–1.00)
Globulin, Total: 2.1 g/dL (ref 1.5–4.5)
Glucose: 155 mg/dL — ABNORMAL HIGH (ref 70–99)
Potassium: 4.6 mmol/L (ref 3.5–5.2)
Sodium: 137 mmol/L (ref 134–144)
Total Protein: 6.5 g/dL (ref 6.0–8.5)
eGFR: 95 mL/min/{1.73_m2} (ref >59)

## 2022-08-31 LAB — HEMOGLOBIN A1C
Est. average glucose Bld gHb Est-mCnc: 186 mg/dL
Hgb A1c MFr Bld: 8.1 % — ABNORMAL HIGH (ref 4.8–5.6)

## 2022-08-31 LAB — VITAMIN D 25 HYDROXY (VIT D DEFICIENCY, FRACTURES): Vit D, 25-Hydroxy: 20.1 ng/mL — ABNORMAL LOW (ref 30.0–100.0)

## 2022-08-31 MED ORDER — VITAMIN D (ERGOCALCIFEROL) 1.25 MG (50000 UNIT) PO CAPS: 50000.0000 [IU] | ORAL_CAPSULE | ORAL | 0 refills | Status: DC

## 2022-08-31 NOTE — Progress Notes (Signed)
Hi Tammy Guerra. It was nice to see you yesterday.  Your lab work looks good.  Your A1c did increase to 8.1%.  I will contact the person who does your Ozempic paperwork and see if we can increase the dose as long as you agree.  This along with your vitamin D being low are probably contributing to your fatigue.  I have sent in a prescription for vitamin D 50,000 IU once weekly for 12 weeks.  After that you should start Vitamin D 2000 IU daily.  This is over the counter.  No other concerns at this time. Continue with your current medication regimen.  Follow up as discussed.  Please let me know if you have any questions.

## 2022-08-31 NOTE — Progress Notes (Signed)
[employer] about a dose increase for this patient. Ask if we could do a verbal over the phone, representative stated that all dose changes have to completed on a reorder form. The form was faxed to [employer] [place]. Form needs to be faxed back to [employer].  Velvet Bathe

## 2022-08-31 NOTE — Addendum Note (Signed)
Addended by: Larae Grooms on: 08/31/2022 08:05 AM   Modules accepted: Orders

## 2022-08-31 NOTE — Telephone Encounter (Signed)
Called patient to let her know we'd be doing a dose increase and to ask when her last shipment was. She did not give me a date but told me she got 4 months worth in the mail and that she had issues getting it from the company.  I practiced empathetic listening and said "That must be very frustrating" and told her that it is not our responsibility to call for refills, that it is her's. I told her we'd send the increase but she may need to call them to check on the status of the fill.  She then told me, "Make sure you stay on the ball on this one because [employer] didn't." I informed her [employer] did a fantastic job getting you this expensive medication for free and that it is the patient's responsibility to "Call the company" for fills but that we would get the dose increase sent in.

## 2022-08-31 NOTE — Telephone Encounter (Signed)
Patient's A1c came back at 8.1%.  Can we please increase her dose of Ozempic to 1mg ?

## 2022-09-03 DIAGNOSIS — H35373 Puckering of macula, bilateral: Secondary | ICD-10-CM | POA: Diagnosis not present

## 2022-09-17 ENCOUNTER — Other Ambulatory Visit: Payer: Self-pay | Admitting: Cardiovascular Disease

## 2022-09-20 ENCOUNTER — Telehealth: Payer: Self-pay

## 2022-09-20 NOTE — Telephone Encounter (Signed)
4 boxes of Ozempic received for the patient. Called and notified patient that it was ready to be picked up.  

## 2022-09-20 NOTE — Telephone Encounter (Signed)
Pt calling is ask [place] the 4 boxes that are ready Are they to be  added to her current dosage? She was told to increase her dosage to a 100. Pt reports she currently has enough boxes for 3 months. Please advise

## 2022-09-20 NOTE — Telephone Encounter (Signed)
Returned patient's call and let her know that the medication received was for the 1 mg Ozempic.

## 2022-09-22 ENCOUNTER — Other Ambulatory Visit: Payer: Self-pay | Admitting: Nurse Practitioner

## 2022-09-22 DIAGNOSIS — E1165 Type 2 diabetes mellitus with hyperglycemia: Secondary | ICD-10-CM

## 2022-09-24 NOTE — Telephone Encounter (Signed)
Requested Prescriptions  Pending Prescriptions Disp Refills   lisinopril (ZESTRIL) 2.5 MG tablet [Pharmacy Med Name: Lisinopril 2.5 MG Oral Tablet] 90 tablet 0    Sig: Take 1 tablet by mouth once daily     Cardiovascular:  ACE Inhibitors Passed - 09/22/2022  6:51 AM      Passed - Cr in normal range and within 180 days    Creatinine, Ser  Date Value Ref Range Status  08/30/2022 0.65 0.57 - 1.00 mg/dL Final         Passed - K in normal range and within 180 days    Potassium  Date Value Ref Range Status  08/30/2022 4.6 3.5 - 5.2 mmol/L Final         Passed - Patient is not pregnant      Passed - Last BP in normal range    BP Readings from Last 1 Encounters:  08/30/22 113/76         Passed - Valid encounter within last 6 months    Recent Outpatient Visits           3 weeks ago PAD (peripheral artery disease) (HCC)   [place] [facility] Larae Grooms, NP   3 months ago Aortic atherosclerosis [facility])   [place] [facility] Larae G[facility], NP   5 months ago COVID-19   [facility]   6 months ago Accelerating angina [facility])   [place] [facility] Larae Grooms, NP   11 months ago Annual physical exam   [place] [facility] Larae Grooms, NP       Future Appointments             In 5 months Larae Grooms, NP [place] [facility], [facility]

## 2022-09-28 ENCOUNTER — Other Ambulatory Visit: Payer: Self-pay | Admitting: Acute Care

## 2022-09-28 DIAGNOSIS — Z122 Encounter for screening for malignant neoplasm of respiratory organs: Secondary | ICD-10-CM

## 2022-09-28 DIAGNOSIS — Z87891 Personal history of nicotine dependence: Secondary | ICD-10-CM

## 2022-10-05 ENCOUNTER — Other Ambulatory Visit: Payer: Self-pay | Admitting: Physician Assistant

## 2022-10-05 DIAGNOSIS — I739 Peripheral vascular disease, unspecified: Secondary | ICD-10-CM

## 2022-10-11 ENCOUNTER — Telehealth: Payer: Self-pay

## 2022-10-11 NOTE — Telephone Encounter (Signed)
5 boxes of Ozempic received for the patient. Called and let her know that it was ready to be picked up.  

## 2022-10-12 NOTE — Telephone Encounter (Signed)
Medication picked up by the patient.  

## 2022-11-13 ENCOUNTER — Encounter: Payer: Self-pay | Admitting: Nurse Practitioner

## 2022-11-13 DIAGNOSIS — D519 Vitamin B12 deficiency anemia, unspecified: Secondary | ICD-10-CM | POA: Insufficient documentation

## 2022-11-13 DIAGNOSIS — E559 Vitamin D deficiency, unspecified: Secondary | ICD-10-CM | POA: Insufficient documentation

## 2022-11-13 HISTORY — DX: Vitamin B12 deficiency anemia, unspecified: D51.9

## 2022-11-15 DIAGNOSIS — M5412 Radiculopathy, cervical region: Secondary | ICD-10-CM | POA: Diagnosis not present

## 2022-11-22 ENCOUNTER — Other Ambulatory Visit: Payer: Self-pay | Admitting: Physician Assistant

## 2022-11-22 ENCOUNTER — Other Ambulatory Visit: Payer: Self-pay | Admitting: Nurse Practitioner

## 2022-11-22 DIAGNOSIS — I739 Peripheral vascular disease, unspecified: Secondary | ICD-10-CM

## 2022-11-22 NOTE — Telephone Encounter (Signed)
Requested medications are due for refill today.  yes  Requested medications are on the active medications list.  yes  Last refill. 08/31/2022 #12 0 rf  Future visit scheduled.   yes  Notes to clinic.  Refill not delegated.    Requested Prescriptions  Pending Prescriptions Disp Refills   Vitamin D, Ergocalciferol, (DRISDOL) 1.25 MG (50000 UNIT) CAPS capsule [Pharmacy Med Name: Vitamin D (Ergocalciferol) 1.25 MG (50000 UT) Oral Capsule] 12 capsule 0    Sig: TAKE ONE CAPSULE BY MOUTH EVERY 7 DAYS     Endocrinology:  Vitamins - Vitamin D Supplementation 2 Failed - 11/22/2022  9:21 AM      Failed - Manual Review: Route requests for 50,000 IU strength to the provider      Failed - Vitamin D in normal range and within 360 days    Vit D, 25-Hydroxy  Date Value Ref Range Status  08/30/2022 20.1 (L) 30.0 - 100.0 ng/mL Final    Comment:    Vitamin D deficiency has been defined by the Institute of Medicine and an Endocrine Society practice guideline as a level of serum 25-OH vitamin D less than 20 ng/mL (1,2). The Endocrine Society went on to further define vitamin D insufficiency as a level between 21 and 29 ng/mL (2). 1. IOM (Institute of Medicine). 2010. Dietary reference    intakes for calcium and D. [place]: The    [employer]. 2. Holick MF, Binkley [place], Bischoff-Ferrari HA, et al.    Evaluation, treatment, and prevention of vitamin D    deficiency: an Endocrine Society clinical practice    guideline. JCEM. 2011 Jul; 96(7):1911-30.          Passed - Ca in normal range and within 360 days    Calcium  Date Value Ref Range Status  08/30/2022 9.7 8.7 - 10.3 mg/dL Final         Passed - Valid encounter within last 12 months    Recent Outpatient Visits           2 months ago PAD (peripheral artery disease) (HCC)   [place] [facility] Larae Grooms, NP   5 months ago Aortic atherosclerosis [facility])   [facility]  Larae G[facility], NP   7 months ago COVID-19   [facility] Lar[facility] G[facility], NP   8 months ago Accelerating angina [facility])   [place] [facility] Larae Grooms, NP   1 year ago Annual physical exam   [facility] Larae [facility], NP       Future Appointments             In 2 months Larae Grooms, NP [place] [facility], [facility]   In 3 months Larae Grooms, NP [place] [facility], [facility]

## 2022-11-28 ENCOUNTER — Telehealth: Payer: Self-pay | Admitting: Nurse Practitioner

## 2022-11-28 ENCOUNTER — Ambulatory Visit: Payer: Medicare HMO | Attending: Physician Assistant

## 2022-11-28 ENCOUNTER — Other Ambulatory Visit: Payer: Self-pay | Admitting: Nurse Practitioner

## 2022-11-28 DIAGNOSIS — I739 Peripheral vascular disease, unspecified: Secondary | ICD-10-CM

## 2022-11-28 LAB — VAS US ABI WITH/WO TBI
Left ABI: 0.67
Right ABI: 0.88

## 2022-11-28 NOTE — Telephone Encounter (Signed)
Copied from CRM [id number]. Topic: General - Other >> Nov 28, 2022  8:20 AM [name] wrote: Reason for CRM: Sabrina from [facility] in [place] states that Tammy Guerra had sent over an order for the pt to get: Lower extremity ultrasound  . Pt is scheduled to have this done today and the Prior Authorization still has not been sent to them. Please call Sabrina back @ [phone].

## 2022-11-28 NOTE — Telephone Encounter (Signed)
Spoke with Tammy Guerra and she says their authorization team was able to obtain that prior authorization for the patient. They have it all taken care of.

## 2022-11-29 ENCOUNTER — Ambulatory Visit: Payer: Medicare HMO

## 2022-11-29 MED ORDER — METFORMIN HCL ER 500 MG PO TB24: ORAL_TABLET | ORAL | 0 refills | Status: DC

## 2022-11-29 NOTE — Telephone Encounter (Signed)
Requested Prescriptions  Pending Prescriptions Disp Refills   metFORMIN (GLUCOPHAGE-XR) 500 MG 24 hr tablet [Pharmacy Med Name: metFORMIN HCl ER 500 MG Oral Tablet Extended Release 24 Hour] 360 tablet 120    Sig: TAKE 2 TABLETS BY MOUTH WITH BREAKFAST AND 2 TABLETS AT BEDTIME     Endocrinology:  Diabetes - Biguanides Failed - 11/28/2022  5:11 AM      Failed - HBA1C is between 0 and 7.9 and within 180 days    Hgb A1c MFr Bld  Date Value Ref Range Status  08/30/2022 8.1 (H) 4.8 - 5.6 % Final    Comment:             Prediabetes: 5.7 - 6.4          Diabetes: >6.4          Glycemic control for adults with diabetes: <7.0          Failed - CBC within normal limits and completed in the last 12 months    WBC  Date Value Ref Range Status  06/01/2022 9.6 3.4 - 10.8 x10E3/uL Final  09/01/2021 10.1 4.0 - 10.5 K/uL Final   RBC  Date Value Ref Range Status  06/01/2022 4.19 3.77 - 5.28 x10E6/uL Final  09/01/2021 4.62 3.87 - 5.11 MIL/uL Final   Hemoglobin  Date Value Ref Range Status  06/01/2022 12.9 11.1 - 15.9 g/dL Final   Hematocrit  Date Value Ref Range Status  06/01/2022 38.5 34.0 - 46.6 % Final   MCHC  Date Value Ref Range Status  06/01/2022 33.5 31.5 - 35.7 g/dL Final  16/01/9603 54.0 30.0 - 36.0 g/dL Final   [facility]  Date Value Ref Range Status  06/01/2022 30.8 26.6 - 33.0 pg Final  09/01/2021 30.1 26.0 - 34.0 pg Final   MCV  Date Value Ref Range Status  06/01/2022 92 79 - 97 fL Final   No results found for: "PLTCOUNTKUC", "LABPLAT", "POCPLA" RDW  Date Value Ref Range Status  06/01/2022 13.0 11.7 - 15.4 % Final         Passed - Cr in normal range and within 360 days    Creatinine, Ser  Date Value Ref Range Status  08/30/2022 0.65 0.57 - 1.00 mg/dL Final         Passed - eGFR in normal range and within 360 days    GFR calc Af Amer  Date Value Ref Range Status  05/27/2017 >60 >60 mL/min Final    Comment:    (NOTE) The eGFR has been calculated using the CKD EPI  equation. This calculation has not been validated in all clinical situations. eGFR's persistently <60 mL/min signify possible Chronic Kidney Disease.    GFR, Estimated  Date Value Ref Range Status  09/01/2021 >60 >60 mL/min Final    Comment:    (NOTE) Calculated using the CKD-EPI Creatinine Equation (2021)    eGFR  Date Value Ref Range Status  08/30/2022 95 >59 mL/min/1.73 Final         Passed - B12 Level in normal range and within 720 days    Vitamin B-12  Date Value Ref Range Status  08/30/2022 252 232 - 1,245 pg/mL Final         Passed - Valid encounter within last 6 months    Recent Outpatient Visits           3 months ago PAD (peripheral artery disease) (HCC)   [place] [facility] Larae Grooms, NP   6 months  ago Aortic atherosclerosis [facility])   [place] [facility] Lar[facility] G[facility], NP   7 months ago COVID-19   [facility] Larae G[facility], NP   9 months ago Accelerating angina [facility])   [place] [facility] Larae Grooms, NP   1 year ago Annual physical exam   [place] [facility] Larae Grooms, NP       Future Appointments             In 1 month Larae Grooms, NP [place] [facility]   In 3 months Larae Grooms, NP [place] [facility], [facility]

## 2022-11-29 NOTE — Telephone Encounter (Signed)
Reordering to be sent electronically.  Requested Prescriptions  Pending Prescriptions Disp Refills   metFORMIN (GLUCOPHAGE-XR) 500 MG 24 hr tablet 360 tablet 0    Sig: TAKE 2 TABLETS BY MOUTH WITH BREAKFAST AND 2 TABLETS AT BEDTIME     Endocrinology:  Diabetes - Biguanides Failed - 11/29/2022 10:24 AM      Failed - HBA1C is between 0 and 7.9 and within 180 days    Hgb A1c MFr Bld  Date Value Ref Range Status  08/30/2022 8.1 (H) 4.8 - 5.6 % Final    Comment:             Prediabetes: 5.7 - 6.4          Diabetes: >6.4          Glycemic control for adults with diabetes: <7.0          Failed - CBC within normal limits and completed in the last 12 months    WBC  Date Value Ref Range Status  06/01/2022 9.6 3.4 - 10.8 x10E3/uL Final  09/01/2021 10.1 4.0 - 10.5 K/uL Final   RBC  Date Value Ref Range Status  06/01/2022 4.19 3.77 - 5.28 x10E6/uL Final  09/01/2021 4.62 3.87 - 5.11 MIL/uL Final   Hemoglobin  Date Value Ref Range Status  06/01/2022 12.9 11.1 - 15.9 g/dL Final   Hematocrit  Date Value Ref Range Status  06/01/2022 38.5 34.0 - 46.6 % Final   MCHC  Date Value Ref Range Status  06/01/2022 33.5 31.5 - 35.7 g/dL Final  16/01/9603 54.0 30.0 - 36.0 g/dL Final   [facility]  Date Value Ref Range Status  06/01/2022 30.8 26.6 - 33.0 pg Final  09/01/2021 30.1 26.0 - 34.0 pg Final   MCV  Date Value Ref Range Status  06/01/2022 92 79 - 97 fL Final   No results found for: "PLTCOUNTKUC", "LABPLAT", "POCPLA" RDW  Date Value Ref Range Status  06/01/2022 13.0 11.7 - 15.4 % Final         Passed - Cr in normal range and within 360 days    Creatinine, Ser  Date Value Ref Range Status  08/30/2022 0.65 0.57 - 1.00 mg/dL Final         Passed - eGFR in normal range and within 360 days    GFR calc Af Amer  Date Value Ref Range Status  05/27/2017 >60 >60 mL/min Final    Comment:    (NOTE) The eGFR has been calculated using the CKD EPI equation. This calculation has not been  validated in all clinical situations. eGFR's persistently <60 mL/min signify possible Chronic Kidney Disease.    GFR, Estimated  Date Value Ref Range Status  09/01/2021 >60 >60 mL/min Final    Comment:    (NOTE) Calculated using the CKD-EPI Creatinine Equation (2021)    eGFR  Date Value Ref Range Status  08/30/2022 95 >59 mL/min/1.73 Final         Passed - B12 Level in normal range and within 720 days    Vitamin B-12  Date Value Ref Range Status  08/30/2022 252 232 - 1,245 pg/mL Final         Passed - Valid encounter within last 6 months    Recent Outpatient Visits           3 months ago PAD (peripheral artery disease) (HCC)   [place] [facility]   6 months ago Aortic atherosclerosis [facility])   [place] [facility] Larae G[facility], [facility]   7 months ago COVID-19   [facility]   9 months ago Accelerating angina [facility])   [place] [facility] Larae Grooms, NP   1 year ago Annual physical exam   [place] [facility] Larae Grooms, NP       Future Appointments             In 1 month Larae Grooms, NP [place] [facility]   In 3 months Larae Grooms, NP [place] [facility]            Signed Prescriptions Disp Refills   metFORMIN (GLUCOPHAGE-XR) 500 MG 24 hr tablet 360 tablet 120    Sig: TAKE 2 TABLETS BY MOUTH WITH BREAKFAST AND 2 TABLETS AT BEDTIME     Endocrinology:  Diabetes - Biguanides Failed - 11/28/2022  5:11 AM      Failed - HBA1C is between 0 and 7.9 and within 180 days    Hgb A1c MFr Bld  Date Value Ref Range Status  08/30/2022 8.1 (H) 4.8 - 5.6 % Final    Comment:             Prediabetes: 5.7 - 6.4          Diabetes: >6.4          Glycemic control for adults with diabetes: <7.0          Failed - CBC within normal limits and completed in the last 12 months     WBC  Date Value Ref Range Status  06/01/2022 9.6 3.4 - 10.8 x10E3/uL Final  09/01/2021 10.1 4.0 - 10.5 K/uL Final   RBC  Date Value Ref Range Status  06/01/2022 4.19 3.77 - 5.28 x10E6/uL Final  09/01/2021 4.62 3.87 - 5.11 MIL/uL Final   Hemoglobin  Date Value Ref Range Status  06/01/2022 12.9 11.1 - 15.9 g/dL Final   Hematocrit  Date Value Ref Range Status  06/01/2022 38.5 34.0 - 46.6 % Final   MCHC  Date Value Ref Range Status  06/01/2022 33.5 31.5 - 35.7 g/dL Final  16/01/9603 54.0 30.0 - 36.0 g/dL Final   [facility]  Date Value Ref Range Status  06/01/2022 30.8 26.6 - 33.0 pg Final  09/01/2021 30.1 26.0 - 34.0 pg Final   MCV  Date Value Ref Range Status  06/01/2022 92 79 - 97 fL Final   No results found for: "PLTCOUNTKUC", "LABPLAT", "POCPLA" RDW  Date Value Ref Range Status  06/01/2022 13.0 11.7 - 15.4 % Final         Passed - Cr in normal range and within 360 days    Creatinine, Ser  Date Value Ref Range Status  08/30/2022 0.65 0.57 - 1.00 mg/dL Final         Passed - eGFR in normal range and within 360 days    GFR calc Af Amer  Date Value Ref Range Status  05/27/2017 >60 >60 mL/min Final    Comment:    (NOTE) The eGFR has been calculated using the CKD EPI equation. This calculation has not been validated in all clinical situations. eGFR's persistently <60 mL/min signify possible Chronic Kidney Disease.    GFR, Estimated  Date Value Ref Range Status  09/01/2021 >60 >60 mL/min Final    Comment:    (NOTE) Calculated using the CKD-EPI Creatinine Equation (2021)    eGFR  Date Value Ref Range Status  08/30/2022 95 >59 mL/min/1.73 Final         Passed - B12 Level in normal range and within 720 days    Vitamin B-12  Date Value Ref Range Status  08/30/2022 252 232 - 1,245 pg/mL Final         Passed - Valid encounter within last 6 months    Recent Outpatient Visits           3 months ago PAD (peripheral artery disease) (HCC)   [place] [facility] Larae [facility]rooms, NP   6 months ago Aortic atherosclerosis [facility])   [place] Larae Grooms, NP   7 months ago COVID-19   [facility] Larae Grooms, NP   9 months ago Accelerating angina [facility])   [place] [facility] Larae Grooms, NP   1 year ago Annual physical exam   [place] [facility] Larae Grooms, NP       Future Appointments             In 1 month Larae Grooms, NP [facility]   In 3 months Larae Grooms, NP [place] [facility], [facility]

## 2022-11-29 NOTE — Addendum Note (Signed)
Addended by: Osie Bond on: 11/29/2022 10:24 AM   Modules accepted: Orders

## 2022-12-04 ENCOUNTER — Ambulatory Visit: Admission: RE | Admit: 2022-12-04 | Payer: Medicare HMO | Source: Ambulatory Visit

## 2022-12-11 ENCOUNTER — Ambulatory Visit
Admission: RE | Admit: 2022-12-11 | Discharge: 2022-12-11 | Disposition: A | Discharge status: Home / Self Care | Payer: Medicare HMO | Source: Ambulatory Visit | Attending: Acute Care | Admitting: Acute Care

## 2022-12-11 DIAGNOSIS — Z122 Encounter for screening for malignant neoplasm of respiratory organs: Secondary | ICD-10-CM | POA: Insufficient documentation

## 2022-12-11 DIAGNOSIS — Z87891 Personal history of nicotine dependence: Secondary | ICD-10-CM | POA: Diagnosis not present

## 2022-12-13 ENCOUNTER — Encounter: Payer: Self-pay | Admitting: Nurse Practitioner

## 2022-12-14 ENCOUNTER — Telehealth: Payer: Self-pay

## 2022-12-14 NOTE — Progress Notes (Signed)
   12/14/2022  Patient ID: Tammy Guerra, female   DOB: July 02, 1950, 72 y.o.   MRN: [id number]  Clinic routed request to assist with Ozempic supplied by [employer] [facility].  Patient has been increased to 1mg  dose, but recently a shipment for Ozempic 0.25/0.5mg  was received.  I contacted [employer] and requested that refills for 0.25/0.5mg  Ozempic be canceled and confirmed auto fill is on for Ozempic 1mg .  Contacted patient to let her know and advised she can use 2 injections of 0.5mg  to equal 1mg , so she doesn't waste the lower dose pens she has on hand.   Lenna Gilford, PharmD, DPLA

## 2022-12-19 ENCOUNTER — Other Ambulatory Visit: Payer: Self-pay | Admitting: Acute Care

## 2022-12-19 DIAGNOSIS — Z122 Encounter for screening for malignant neoplasm of respiratory organs: Secondary | ICD-10-CM

## 2022-12-19 DIAGNOSIS — Z87891 Personal history of nicotine dependence: Secondary | ICD-10-CM

## 2022-12-26 DIAGNOSIS — M5412 Radiculopathy, cervical region: Secondary | ICD-10-CM | POA: Diagnosis not present

## 2022-12-28 DIAGNOSIS — M5412 Radiculopathy, cervical region: Secondary | ICD-10-CM | POA: Diagnosis not present

## 2022-12-31 ENCOUNTER — Encounter: Payer: Self-pay | Admitting: Cardiovascular Disease

## 2022-12-31 ENCOUNTER — Other Ambulatory Visit: Payer: Self-pay

## 2022-12-31 MED ORDER — CILOSTAZOL 100 MG PO TABS: 100.0000 mg | ORAL_TABLET | Freq: Two times a day (BID) | ORAL | 2 refills | Status: DC

## 2022-12-31 NOTE — Telephone Encounter (Signed)
Requested Prescriptions   Signed Prescriptions Disp Refills   cilostazol (PLETAL) 100 MG tablet 180 tablet 2    Sig: Take 1 tablet (100 mg total) by mouth 2 (two) times daily.    Authorizing Provider: Lorine Bears A    Ordering User: Guerry Minors

## 2023-01-10 ENCOUNTER — Telehealth: Payer: Self-pay | Admitting: Cardiovascular Disease

## 2023-01-10 NOTE — Telephone Encounter (Signed)
Pre-operative Risk Assessment    Patient Name: Tammy Guerra  DOB: 03/15/1951 MRN: [id number]      Request for Surgical Clearance    Procedure:   Cervical Epidural Steroid Injection  Date of Surgery:  Clearance TBD                                 Surgeon:  Not indicated Surgeon's Group or Practice Name:  [facility]. Phone number:  [phone] Fax number:  [phone]   Type of Clearance Requested:   - Pharmacy:  Hold Cilostazol  Discontinue 2 days prior to procedure and resume 24 hours after procedure   Type of Anesthesia:  None    Additional requests/questions:    Signed, Shawna Orleans   01/10/2023, 9:44 AM

## 2023-01-10 NOTE — Telephone Encounter (Signed)
Name: Tammy Guerra  DOB: 1951-01-04  MRN: [id number]  Primary Cardiologist: Lorine Bears, MD   Preoperative team, please contact this patient and set up a phone call appointment for further preoperative risk assessment. Please obtain consent and complete medication review. Thank you for your help.  I confirm that guidance regarding antiplatelet and oral anticoagulation therapy has been completed and, if necessary, noted below.  Patient may hold Cilostazol for 2 days prior to procedure, please resume when safe to do so from a bleeding standpoint. Aspirin may be held for 5-7 days prior to procedure, please resume when safe to do so from a bleeding standpoint.  Rip Harbour, NP 01/10/2023, 12:13 PM [facility]

## 2023-01-11 ENCOUNTER — Telehealth: Payer: Self-pay

## 2023-01-11 NOTE — Telephone Encounter (Signed)
Pt is scheduled for tele appt on 10/7 at 9am. Med rec and consent done

## 2023-01-11 NOTE — Telephone Encounter (Signed)
Pt is scheduled for tele appt on 10/7 at 9am. Med rec and consent done    Patient Consent for Virtual Visit        Jalisa Zuvich Blatter has provided verbal consent on 01/11/2023 for a virtual visit (video or telephone).   CONSENT FOR VIRTUAL VISIT FOR:  Jenel Lucks D Hassinger  By participating in this virtual visit I agree to the following:  I hereby voluntarily request, consent and authorize [facility] and its employed or contracted physicians, physician assistants, nurse practitioners or other licensed health care professionals (the Practitioner), to provide me with telemedicine health care services (the "Services") as deemed necessary by the treating Practitioner. I acknowledge and consent to receive the Services by the Practitioner via telemedicine. I understand that the telemedicine visit will involve communicating with the Practitioner through live audiovisual communication technology and the disclosure of certain medical information by electronic transmission. I acknowledge that I have been given the opportunity to request an in-person assessment or other available alternative prior to the telemedicine visit and am voluntarily participating in the telemedicine visit.  I understand that I have the right to withhold or withdraw my consent to the use of telemedicine in the course of my care at any time, without affecting my right to future care or treatment, and that the Practitioner or I may terminate the telemedicine visit at any time. I understand that I have the right to inspect all information obtained and/or recorded in the course of the telemedicine visit and may receive copies of available information for a reasonable fee.  I understand that some of the potential risks of receiving the Services via telemedicine include:  Delay or interruption in medical evaluation due to technological equipment failure or disruption; Information transmitted may not be sufficient (e.g. poor resolution of  images) to allow for appropriate medical decision making by the Practitioner; and/or  In rare instances, security protocols could fail, causing a breach of personal health information.  Furthermore, I acknowledge that it is my responsibility to provide information about my medical history, conditions and care that is complete and accurate to the best of my ability. I acknowledge that Practitioner's advice, recommendations, and/or decision may be based on factors not within their control, such as incomplete or inaccurate data provided by me or distortions of diagnostic images or specimens that may result from electronic transmissions. I understand that the practice of medicine is not an exact science and that Practitioner makes no warranties or guarantees regarding treatment outcomes. I acknowledge that a copy of this consent can be made available to me via my patient portal [facility] MyChart), or I can request a printed copy by calling the office of [facility].    I understand that my insurance will be billed for this visit.   I have read or had this consent read to me. I understand the contents of this consent, which adequately explains the benefits and risks of the Services being provided via telemedicine.  I have been provided ample opportunity to ask questions regarding this consent and the Services and have had my questions answered to my satisfaction. I give my informed consent for the services to be provided through the use of telemedicine in my medical care

## 2023-01-21 ENCOUNTER — Encounter: Payer: Medicare HMO | Admitting: Nurse Practitioner

## 2023-01-28 ENCOUNTER — Ambulatory Visit: Payer: Medicare HMO

## 2023-01-31 ENCOUNTER — Encounter: Payer: Self-pay | Admitting: Nurse Practitioner

## 2023-01-31 ENCOUNTER — Ambulatory Visit: Payer: Medicare HMO | Admitting: Nurse Practitioner

## 2023-01-31 ENCOUNTER — Encounter: Payer: Medicare HMO | Admitting: Nurse Practitioner

## 2023-01-31 VITALS — BP 120/73 | HR 80 | Temp 98.0°F | Wt 186.0 lb

## 2023-01-31 DIAGNOSIS — E559 Vitamin D deficiency, unspecified: Secondary | ICD-10-CM | POA: Diagnosis not present

## 2023-01-31 DIAGNOSIS — Z7985 Long-term (current) use of injectable non-insulin antidiabetic drugs: Secondary | ICD-10-CM | POA: Diagnosis not present

## 2023-01-31 DIAGNOSIS — F411 Generalized anxiety disorder: Secondary | ICD-10-CM | POA: Diagnosis not present

## 2023-01-31 DIAGNOSIS — D519 Vitamin B12 deficiency anemia, unspecified: Secondary | ICD-10-CM

## 2023-01-31 DIAGNOSIS — E1165 Type 2 diabetes mellitus with hyperglycemia: Secondary | ICD-10-CM

## 2023-01-31 DIAGNOSIS — Z1231 Encounter for screening mammogram for malignant neoplasm of breast: Secondary | ICD-10-CM

## 2023-01-31 DIAGNOSIS — I700 Atherosclerosis of aorta: Secondary | ICD-10-CM | POA: Diagnosis not present

## 2023-01-31 DIAGNOSIS — I739 Peripheral vascular disease, unspecified: Secondary | ICD-10-CM | POA: Diagnosis not present

## 2023-01-31 DIAGNOSIS — J439 Emphysema, unspecified: Secondary | ICD-10-CM | POA: Diagnosis not present

## 2023-01-31 DIAGNOSIS — I25118 Atherosclerotic heart disease of native coronary artery with other forms of angina pectoris: Secondary | ICD-10-CM

## 2023-01-31 MED ORDER — METFORMIN HCL ER 500 MG PO TB24: ORAL_TABLET | ORAL | 1 refills | Status: DC

## 2023-01-31 MED ORDER — LISINOPRIL 2.5 MG PO TABS
2.5000 mg | ORAL_TABLET | Freq: Every day | ORAL | 1 refills | Status: DC
Start: 2023-01-31 — End: 2023-06-21

## 2023-01-31 MED ORDER — TRAZODONE HCL 50 MG PO TABS: ORAL_TABLET | ORAL | 1 refills | Status: DC

## 2023-01-31 MED ORDER — ROSUVASTATIN CALCIUM 20 MG PO TABS: 20.0000 mg | ORAL_TABLET | Freq: Every day | ORAL | 1 refills | Status: DC

## 2023-01-31 NOTE — Assessment & Plan Note (Signed)
Chronic. Followed by cardiology.  Continue to follow per their recommendations. Recommend continued smoking cessation.

## 2023-01-31 NOTE — Assessment & Plan Note (Signed)
Labs ordered at visit today.  Will make recommendations based on lab results.   

## 2023-01-31 NOTE — Assessment & Plan Note (Addendum)
Chronic.  Not well controlled.  Last A1c was >8%.  Continue with current medication regimen on Ozempic 0.5mg  and Metformin 500mg  BID.  Labs ordered today.  If A1c is elevated can increase Ozempic to 1mg . Patient would like to work on diet and exercise before increasing.  Prescription written for Freestyle libre at patient's request.  Return to clinic in 3 months for reevaluation.  Call sooner if concerns arise.

## 2023-01-31 NOTE — Assessment & Plan Note (Signed)
Chronic. Followed by Cardiology. Continue with Rosuvastatin daily and Zetia.  Refills sent today.  Labs ordered. Follow up in 3 months.  Call sooner if concerns arise.

## 2023-01-31 NOTE — Assessment & Plan Note (Signed)
Chronic. Followed by Cardiology. Continue with rosuvastatin and Zetia.  Refills sent today.  Labs ordered. Follow up in 3 months.  Call sooner if concerns arise.

## 2023-01-31 NOTE — Assessment & Plan Note (Signed)
Chronic.  Controlled.  Labs ordered today.  Return to clinic in 6 months for reevaluation.  Call sooner if concerns arise. Smoking cessation since September 2022.  Encouraged patient to continue with smoking cessation.

## 2023-01-31 NOTE — Patient Instructions (Signed)
Please call to schedule your mammogram and/or bone density: [facility]  Address: [address], [place], [place] [address] Phone: [phone]  [facility] [place] [address] [place],  [place]  [address] Phone: [phone]   

## 2023-01-31 NOTE — Progress Notes (Signed)
BP 120/73   Pulse 80   Temp 98 F (36.7 C) (Oral)   Wt 186 lb (84.4 kg)   SpO2 99%   BMI 30.95 kg/m    Subjective:    Patient ID: Tammy Guerra, female    DOB: 1950/11/04, 72 y.o.   MRN: [id number]  HPI: Tammy Guerra is a 71 y.o. female  Chief Complaint  Patient presents with   Diabetes   DIABETES Hypoglycemic episodes:no Polydipsia/polyuria: no Visual disturbance: no Chest pain: no Paresthesias: no Glucose Monitoring: no  Accucheck frequency: sometimes  Fasting glucose: 180-190 but comes down throughout the day  Post prandial:  Evening:  Before meals: Taking Insulin?: no  Long acting insulin:  Short acting insulin: Blood Pressure Monitoring: a few times a month Retinal Examination: Up to Date- scheduled with [facility] Foot Exam: Up to Date Diabetic Education: Not Completed Pneumovax: Not up to Date Influenza: Not up to Date Aspirin: no  COPD Doing well.  Denies concerns at visit today.   COPD status: controlled Satisfied with current treatment?: no Oxygen use: no Dyspnea frequency:  Cough frequency:  Rescue inhaler frequency:   Limitation of activity: no Productive cough:  Last Spirometry:  Pneumovax: Not up to Date Influenza: Not up to Date  ANXIETY Doing well.  Still has 1 refill of the xanax.  She is using trazodone for sleep and it is working well for her.     Relevant past medical, surgical, family and social history reviewed and updated as indicated. Interim medical history since our last visit reviewed. Allergies and medications reviewed and updated.  Review of Systems  Eyes:  Negative for visual disturbance.  Respiratory:  Negative for cough, chest tightness and shortness of breath.   Cardiovascular:  Negative for chest pain, palpitations and leg swelling.  Endocrine: Negative for polydipsia and polyuria.  Neurological:  Negative for dizziness, numbness and headaches.  Psychiatric/Behavioral:  The patient is nervous/anxious.      Per HPI unless specifically indicated above     Objective:    BP 120/73   Pulse 80   Temp 98 F (36.7 C) (Oral)   Wt 186 lb (84.4 kg)   SpO2 99%   BMI 30.95 kg/m   Wt Readings from Last 3 Encounters:  01/31/23 186 lb (84.4 kg)  08/30/22 188 lb 14.4 oz (85.7 kg)  08/23/22 187 lb (84.8 kg)    Physical Exam Vitals and nursing note reviewed.  Constitutional:      General: She is not in acute distress.    Appearance: Normal appearance. She is normal weight. She is not ill-appearing, toxic-appearing or diaphoretic.  HENT:     Head: Normocephalic.     Right Ear: External ear normal.     Left Ear: External ear normal.     Nose: Nose normal.     Mouth/Throat:     Mouth: Mucous membranes are moist.     Pharynx: Oropharynx is clear.  Eyes:     General:        Right eye: No discharge.        Left eye: No discharge.     Extraocular Movements: Extraocular movements intact.     Conjunctiva/sclera: Conjunctivae normal.     Pupils: Pupils are equal, round, and reactive to light.  Cardiovascular:     Rate and Rhythm: Normal rate and regular rhythm.     Heart sounds: No murmur heard. Pulmonary:     Effort: Pulmonary effort is normal. No respiratory distress.  Breath sounds: Normal breath sounds. No wheezing or rales.  Musculoskeletal:     Cervical back: Normal range of motion and neck supple.  Skin:    General: Skin is warm and dry.     Capillary Refill: Capillary refill takes less than 2 seconds.  Neurological:     General: No focal deficit present.     Mental Status: She is alert and oriented to person, place, and time. Mental status is at baseline.  Psychiatric:        Mood and Affect: Mood normal.        Behavior: Behavior normal.        Thought Content: Thought content normal.        Judgment: Judgment normal.     Results for orders placed or performed in visit on 11/28/22  VAS [place] ABI WITH/WO TBI  Result Value Ref Range   Right ABI 0.88    Left ABI 0.67        Assessment & Plan:   Problem List Items Addressed This Visit       Cardiovascular and Mediastinum   PAD (peripheral artery disease) (HCC) - Primary    Chronic. Followed by cardiology.  Continue to follow per their recommendations. Recommend continued smoking cessation.      Relevant Medications   lisinopril (ZESTRIL) 2.5 MG tablet   rosuvastatin (CRESTOR) 20 MG tablet   Aortic atherosclerosis (HCC)    Chronic. Followed by Cardiology. Continue with Rosuvastatin daily and Zetia.  Refills sent today.  Labs ordered. Follow up in 3 months.  Call sooner if concerns arise.      Relevant Medications   lisinopril (ZESTRIL) 2.5 MG tablet   rosuvastatin (CRESTOR) 20 MG tablet   Coronary artery disease    Chronic. Followed by Cardiology. Continue with rosuvastatin and Zetia.  Refills sent today.  Labs ordered. Follow up in 3 months.  Call sooner if concerns arise.      Relevant Medications   lisinopril (ZESTRIL) 2.5 MG tablet   rosuvastatin (CRESTOR) 20 MG tablet     Respiratory   Emphysema lung (HCC)    Chronic.  Controlled.  Labs ordered today.  Return to clinic in 6 months for reevaluation.  Call sooner if concerns arise. Smoking cessation since September 2022.  Encouraged patient to continue with smoking cessation.         Endocrine   Uncontrolled type 2 diabetes mellitus with hyperglycemia (HCC)    Chronic.  Not well controlled.  Last A1c was >8%.  Continue with current medication regimen on Ozempic 0.5mg  and Metformin 500mg  BID.  Labs ordered today.  If A1c is elevated can increase Ozempic to 1mg . Patient would like to work on diet and exercise before increasing.  Prescription written for Freestyle libre at patient's request.  Return to clinic in 3 months for reevaluation.  Call sooner if concerns arise.        Relevant Medications   lisinopril (ZESTRIL) 2.5 MG tablet   metFORMIN (GLUCOPHAGE-XR) 500 MG 24 hr tablet   rosuvastatin (CRESTOR) 20 MG tablet   Other Relevant  Orders   Comprehensive metabolic panel   Lipid panel   HgB A1c     Other   Generalized anxiety disorder    Chronic. Ongoing.  Rarely uses Xanax.  No refill needed today.  Last refill was in May.  30 pills should last around 45 days.  Will refill before next appt, if needed.  Follow up in 3 months for reevaluation.  Call sooner  if concerns arise.       Relevant Medications   traZODone (DESYREL) 50 MG tablet   Vitamin D deficiency    Labs ordered at visit today.  Will make recommendations based on lab results.        Relevant Orders   Vitamin D (25 hydroxy)   B12 deficiency anemia    Labs ordered at visit today.  Will make recommendations based on lab results.        Relevant Orders   B12   Other Visit Diagnoses     Encounter for screening mammogram for malignant neoplasm of breast       Relevant Orders   MM 3D SCREENING MAMMOGRAM BILATERAL BREAST        Follow up plan: Return in about 3 months (around 05/03/2023) for Physical and Fasting labs.

## 2023-01-31 NOTE — Assessment & Plan Note (Signed)
Chronic. Ongoing.  Rarely uses Xanax.  No refill needed today.  Last refill was in May.  30 pills should last around 45 days.  Will refill before next appt, if needed.  Follow up in 3 months for reevaluation.  Call sooner if concerns arise.

## 2023-02-01 LAB — COMPREHENSIVE METABOLIC PANEL
ALT: 15 [IU]/L (ref 0–32)
AST: 14 [IU]/L (ref 0–40)
Albumin: 4.3 g/dL (ref 3.9–4.9)
Alkaline Phosphatase: 74 [IU]/L (ref 44–121)
BUN/Creatinine Ratio: 16 (ref 12–28)
BUN: 10 mg/dL (ref 8–27)
Bilirubin Total: 0.3 mg/dL (ref 0.0–1.2)
CO2: 22 mmol/L (ref 20–29)
Calcium: 9.4 mg/dL (ref 8.7–10.3)
Chloride: 101 mmol/L (ref 96–106)
Creatinine, Ser: 0.62 mg/dL (ref 0.57–1.00)
Globulin, Total: 1.8 g/dL (ref 1.5–4.5)
Glucose: 154 mg/dL — ABNORMAL HIGH (ref 70–99)
Potassium: 4.4 mmol/L (ref 3.5–5.2)
Sodium: 138 mmol/L (ref 134–144)
Total Protein: 6.1 g/dL (ref 6.0–8.5)
eGFR: 96 mL/min/{1.73_m2} (ref >59)

## 2023-02-01 LAB — VITAMIN B12: Vitamin B-12: 235 pg/mL (ref 232–1245)

## 2023-02-01 LAB — LIPID PANEL
Chol/HDL Ratio: 2.1 {ratio} (ref 0.0–4.4)
Cholesterol, Total: 103 mg/dL (ref 100–199)
HDL: 50 mg/dL (ref >39)
LDL Chol Calc (NIH): 38 mg/dL (ref 0–99)
Triglycerides: 73 mg/dL (ref 0–149)
VLDL Cholesterol Cal: 15 mg/dL (ref 5–40)

## 2023-02-01 LAB — HEMOGLOBIN A1C
Est. average glucose Bld gHb Est-mCnc: 160 mg/dL
Hgb A1c MFr Bld: 7.2 % — ABNORMAL HIGH (ref 4.8–5.6)

## 2023-02-01 LAB — VITAMIN D 25 HYDROXY (VIT D DEFICIENCY, FRACTURES): Vit D, 25-Hydroxy: 37.2 ng/mL (ref 30.0–100.0)

## 2023-02-01 NOTE — Progress Notes (Signed)
HI Tammy Guerra.  It was nice to see you yesterday.  Your lab work looks good.  Your A1c is well controlled at 7.2%.  Your efforts to lower your A1c paid off.  Keep up the good work.  No concerns at this time. Continue with your current medication regimen.  Follow up as discussed.  Please let me know if you have any questions.

## 2023-02-04 ENCOUNTER — Telehealth: Payer: Self-pay

## 2023-02-04 NOTE — Progress Notes (Signed)
   02/04/2023  Patient ID: Tammy Guerra, female   DOB: 08-10-1950, 72 y.o.   MRN: [id number]  Clinic routed request from patient's PCP, Larae Grooms, NP, stating patient was inquiring about [employer] re-enrollment for Ozempic at her recent office visit.  -Patient is currently enrolled in [employer] for Ozempic.  Enrollment should be for Ozempic 1mg , but she states the last supply received was for 0.25/0.5mg  pens, so she is currently taking two injections of 0.5mg  weekly -She has not received renewal information from [employer] for 2025 -Coordinating with medication assistance team to help with program renewal for 2025 -I am contacting [employer] to verify she no longer receives 0.25/0.5mg  pens and that her next refill will be for 1mg   Lenna Gilford, PharmD, DPLA

## 2023-02-06 NOTE — Progress Notes (Signed)
   02/06/2023  Patient ID: Tammy Guerra, female   DOB: 1951/01/02, 72 y.o.   MRN: [id number]  Contacted [employer] to request automatic refills for Ozempic 0.25/0.5mg  be turned off.  Verified auto refills for Ozempic 1mg  are activated, and the next refill sent will be for this strength.  Lenna Gilford, PharmD, DPLA

## 2023-02-13 ENCOUNTER — Telehealth: Payer: Self-pay

## 2023-02-13 ENCOUNTER — Other Ambulatory Visit (HOSPITAL_COMMUNITY): Payer: Self-pay

## 2023-02-13 NOTE — Telephone Encounter (Signed)
PAP: Application for Ozempic has been submitted to PAP Companies: [employer], online  RE ENROLLMENT FOR 2025

## 2023-02-14 DIAGNOSIS — H43813 Vitreous degeneration, bilateral: Secondary | ICD-10-CM | POA: Diagnosis not present

## 2023-02-14 DIAGNOSIS — H35341 Macular cyst, hole, or pseudohole, right eye: Secondary | ICD-10-CM | POA: Diagnosis not present

## 2023-02-14 DIAGNOSIS — H35373 Puckering of macula, bilateral: Secondary | ICD-10-CM | POA: Diagnosis not present

## 2023-02-14 LAB — HM DIABETES EYE EXAM

## 2023-02-15 ENCOUNTER — Encounter: Payer: Self-pay | Admitting: Nurse Practitioner

## 2023-02-15 ENCOUNTER — Telehealth: Payer: Self-pay

## 2023-02-15 NOTE — Telephone Encounter (Signed)
3 boxes of Ozempic received for the patient. Called and LVM notifying her that it was ready to be picked up.

## 2023-02-19 NOTE — Telephone Encounter (Signed)
Medication picked up by the patient.  

## 2023-02-19 NOTE — Telephone Encounter (Addendum)
PAP: Patient assistance application for Ozempic has been approved by PAP Companies: [employer] from 04/24/2023 to 04/22/2024. Medication should be delivered to PAP Delivery: Provider's office For further shipping updates, please contact [employer] at [phone] Pt ID is: [id number]

## 2023-02-25 MED ORDER — FREESTYLE LIBRE 3 PLUS SENSOR MISC: 1 refills | Status: DC

## 2023-03-01 ENCOUNTER — Ambulatory Visit (INDEPENDENT_AMBULATORY_CARE_PROVIDER_SITE_OTHER): Payer: Medicare HMO | Admitting: Family Medicine

## 2023-03-01 ENCOUNTER — Other Ambulatory Visit: Payer: Self-pay | Admitting: Physician Assistant

## 2023-03-01 VITALS — BP 144/79 | HR 85 | Temp 98.3°F | Ht 66.73 in | Wt 181.0 lb

## 2023-03-01 DIAGNOSIS — R300 Dysuria: Secondary | ICD-10-CM | POA: Diagnosis not present

## 2023-03-01 DIAGNOSIS — R319 Hematuria, unspecified: Secondary | ICD-10-CM

## 2023-03-01 DIAGNOSIS — N760 Acute vaginitis: Secondary | ICD-10-CM

## 2023-03-01 DIAGNOSIS — B9689 Other specified bacterial agents as the cause of diseases classified elsewhere: Secondary | ICD-10-CM | POA: Diagnosis not present

## 2023-03-01 DIAGNOSIS — N200 Calculus of kidney: Secondary | ICD-10-CM

## 2023-03-01 LAB — URINALYSIS, ROUTINE W REFLEX MICROSCOPIC: Specific Gravity, UA: 1.02 (ref 1.005–1.030)

## 2023-03-01 LAB — MICROSCOPIC EXAMINATION
Bacteria, UA: NONE SEEN
Epithelial Cells (non renal): NONE SEEN /[HPF] (ref 0–10)

## 2023-03-01 LAB — WET PREP FOR TRICH, YEAST, CLUE
Clue Cell Exam: POSITIVE — AB
Trichomonas Exam: NEGATIVE
Yeast Exam: NEGATIVE

## 2023-03-01 MED ORDER — METRONIDAZOLE 500 MG PO TABS: 500.0000 mg | ORAL_TABLET | Freq: Two times a day (BID) | ORAL | 0 refills | Status: AC

## 2023-03-01 NOTE — Patient Instructions (Signed)
Increase water intake to 90 ounces daily (5-6 water bottles daily)

## 2023-03-01 NOTE — Progress Notes (Signed)
BP (!) 144/79   Pulse 85   Temp 98.3 F (36.8 C) (Oral)   Ht 5' 6.73" (1.695 m)   Wt 181 lb (82.1 kg)   SpO2 95%   BMI 28.58 kg/m    Subjective:    Patient ID: Tammy Guerra, female    DOB: 1950/10/15, 72 y.o.   MRN: [id number]  HPI: Tammy Guerra is a 72 y.o. female  Chief Complaint  Patient presents with   Urinary Tract Infection   URINARY SYMPTOMS Today, she started having dysuria with urination. She has history of kidney stones, requiring annual CT's but insurance did not allow for this year and further. She is complaining of blood in urine that is bright red with each urine occurrence.  Dysuria: burning Urinary frequency: yes Urgency: yes Small volume voids: yes Symptom severity: yes Urinary incontinence: no Foul odor: no Hematuria: yes along with wiping, tiny specs Abdominal pain: no Back pain: Yes, lower back Suprapubic pain/pressure: no Flank pain: no Fever:  no Vomiting: no Relief with cranberry juice: no Relief with pyridium: no Status: worse Previous urinary tract infection: no Recurrent urinary tract infection: no Sexual activity: No History of sexually transmitted disease: no Treatments attempted: increasing fluids    Relevant past medical, surgical, family and social history reviewed and updated as indicated. Interim medical history since our last visit reviewed. Allergies and medications reviewed and updated.  Review of Systems  Constitutional:  Negative for chills and fever.  Respiratory: Negative.    Cardiovascular: Negative.   Gastrointestinal:  Negative for vomiting.  Genitourinary:  Positive for dysuria, frequency, hematuria and urgency. Negative for decreased urine volume, difficulty urinating, dyspareunia, flank pain and pelvic pain.  Musculoskeletal:  Positive for back pain (lower back).    Per HPI unless specifically indicated above     Objective:    BP (!) 144/79   Pulse 85   Temp 98.3 F (36.8 C) (Oral)   Ht 5' 6.73"  (1.695 m)   Wt 181 lb (82.1 kg)   SpO2 95%   BMI 28.58 kg/m   Wt Readings from Last 3 Encounters:  03/01/23 181 lb (82.1 kg)  01/31/23 186 lb (84.4 kg)  08/30/22 188 lb 14.4 oz (85.7 kg)    Physical Exam Vitals and nursing note reviewed.  Constitutional:      General: She is awake. She is not in acute distress.    Appearance: Normal appearance. She is well-developed and well-groomed. She is not ill-appearing.  HENT:     Head: Normocephalic and atraumatic.     Right Ear: Hearing and external ear normal. No drainage.     Left Ear: Hearing and external ear normal. No drainage.     Nose: Nose normal.  Eyes:     General: Lids are normal.        Right eye: No discharge.        Left eye: No discharge.     Conjunctiva/sclera: Conjunctivae normal.  Cardiovascular:     Rate and Rhythm: Normal rate and regular rhythm.     Pulses:          Radial pulses are 2+ on the right side and 2+ on the left side.     Heart sounds: Normal heart sounds, S1 normal and S2 normal. No murmur heard.    No gallop.  Pulmonary:     Effort: Pulmonary effort is normal. No accessory muscle usage or respiratory distress.     Breath sounds: Normal breath sounds.  Abdominal:     Tenderness: There is no abdominal tenderness. There is no right CVA tenderness or left CVA tenderness.  Musculoskeletal:        General: Normal range of motion.     Cervical back: Full passive range of motion without pain and normal range of motion.     Right lower leg: No edema.     Left lower leg: No edema.  Skin:    General: Skin is warm and dry.     Capillary Refill: Capillary refill takes less than 2 seconds.  Neurological:     Mental Status: She is alert and oriented to person, place, and time.  Psychiatric:        Attention and Perception: Attention normal.        Mood and Affect: Mood normal.        Speech: Speech normal.        Behavior: Behavior normal. Behavior is cooperative.        Thought Content: Thought content  normal.     Results for orders placed or performed in visit on 02/15/23  HM DIABETES EYE EXAM  Result Value Ref Range   HM Diabetic Eye Exam No Retinopathy No Retinopathy      Assessment & Plan:   Problem List Items Addressed This Visit   None Visit Diagnoses     Bacterial vaginosis    -  Primary   Acute, ongoing. Flagyl BID for 7 days.   Relevant Medications   metroNIDAZOLE (FLAGYL) 500 MG tablet   Dysuria       Acute, ongoing. Wet prep + for BV, unable to read UA d/t hematuria. Will treat based on culture.   Relevant Orders   Urinalysis, Routine w reflex microscopic   Urine Culture   WET PREP FOR TRICH, YEAST, CLUE   Hematuria of unknown cause       Acute, ongoing. History of renal stones, urgent referral placed for urology, will see them Monday.   Relevant Orders   Ambulatory referral to Urology        Follow up plan: Return if symptoms worsen or fail to improve.

## 2023-03-04 ENCOUNTER — Ambulatory Visit
Admission: RE | Admit: 2023-03-04 | Discharge: 2023-03-04 | Disposition: A | Discharge status: Home / Self Care | Payer: Medicare HMO | Source: Ambulatory Visit | Attending: Physician Assistant | Admitting: Physician Assistant

## 2023-03-04 ENCOUNTER — Ambulatory Visit
Admission: RE | Admit: 2023-03-04 | Discharge: 2023-03-04 | Disposition: A | Discharge status: Home / Self Care | Payer: Medicare HMO | Attending: Physician Assistant | Admitting: Physician Assistant

## 2023-03-04 ENCOUNTER — Ambulatory Visit: Payer: Medicare HMO | Admitting: Physician Assistant

## 2023-03-04 VITALS — BP 95/61 | HR 85 | Ht 65.0 in | Wt 182.1 lb

## 2023-03-04 DIAGNOSIS — N200 Calculus of kidney: Secondary | ICD-10-CM | POA: Diagnosis not present

## 2023-03-04 DIAGNOSIS — R3129 Other microscopic hematuria: Secondary | ICD-10-CM | POA: Diagnosis not present

## 2023-03-04 DIAGNOSIS — R82998 Other abnormal findings in urine: Secondary | ICD-10-CM | POA: Diagnosis not present

## 2023-03-04 DIAGNOSIS — N2889 Other specified disorders of kidney and ureter: Secondary | ICD-10-CM | POA: Diagnosis not present

## 2023-03-04 DIAGNOSIS — R310 Gross hematuria: Secondary | ICD-10-CM | POA: Diagnosis not present

## 2023-03-04 LAB — URINALYSIS, COMPLETE
Bilirubin, UA: NEGATIVE
Glucose, UA: NEGATIVE
Ketones, UA: NEGATIVE
Nitrite, UA: POSITIVE — AB
Specific Gravity, UA: 1.02 (ref 1.005–1.030)
Urobilinogen, Ur: 0.2 mg/dL (ref 0.2–1.0)
pH, UA: 6.5 (ref 5.0–7.5)

## 2023-03-04 LAB — MICROSCOPIC EXAMINATION
Epithelial Cells (non renal): >10 /[HPF] — AB (ref 0–10)
WBC, UA: >30 /[HPF] — AB (ref 0–5)

## 2023-03-04 MED ORDER — FLUCONAZOLE 150 MG PO TABS
150.0000 mg | ORAL_TABLET | Freq: Once | ORAL | 0 refills | Status: AC
Start: 2023-03-04 — End: 2023-03-04

## 2023-03-04 MED ORDER — CIPROFLOXACIN HCL 250 MG PO TABS: 250.0000 mg | ORAL_TABLET | Freq: Two times a day (BID) | ORAL | 0 refills | Status: AC

## 2023-03-04 NOTE — Progress Notes (Signed)
03/04/2023 1:03 PM   Tammy Guerra 24-Feb-1951 [id number]  CC: Chief Complaint  Patient presents with   Hematuria   HPI: Tammy Guerra is a 72 y.o. female with PMH nephrolithiasis who presents today for evaluation of gross hematuria.   She saw her PCP 3 days ago with reports of dysuria, frequency, and gross hematuria.  She was diagnosed with BV and started on Flagyl.  UA unable to be read due to red color; urine microscopy notable for 6-10 WBC/hpf, >30 RBCs/hpf, and no epithelial cells or bacteria.  Urine culture is pending but growing gram-negative rods.  Today she reports she is feeling a little bit better, but has had persistent malaise.  Her dysuria began 3 days ago, but in retrospect she noticed some pink streaks in her pads for about a week prior.  She has had no flank pain, fever, chills, nausea, or vomiting.  KUB today with interval enlargement of her left renal stone burden, now measuring 21 mm in length.  No radiopaque ureteral stones.  In-office UA today positive for 1+ blood, 1+ protein, nitrates, and 1+ leukocytes; urine microscopy with >30 WBCs/HPF, 3-10 RBCs/HPF, >10 epithelial cells/hpf, and many bacteria.  PMH: Past Medical History:  Diagnosis Date   Anxiety    Arthritis    B12 deficiency anemia 11/13/2022   Cancer (HCC)    skin/CERVICAL CA   Complication of anesthesia    discomfort during first cataract   Diabetes mellitus without complication (HCC)    Emphysema lung (HCC) 07/01/2020   Emphysema lung (HCC) 07/01/2020   GERD (gastroesophageal reflux disease)    NO MEDS   History of kidney stones    STONES AND CYSTS   Kidney stone    Palpitations    Pancreatic cyst     Surgical History: Past Surgical History:  Procedure Laterality Date   CARDIAC CATHETERIZATION     CATARACT EXTRACTION W/PHACO Left 09/07/2014   Procedure: CATARACT EXTRACTION PHACO AND INTRAOCULAR LENS PLACEMENT (IOC);  Surgeon: Galen Manila, MD;  Location: [facility] ORS;  Service:  Ophthalmology;  Laterality: Left;  [place] 01:03 AP% 27.7 CDE 17.55   CORONARY ANGIOPLASTY     EYE SURGERY     cataract   KNEE ARTHROSCOPY WITH MEDIAL MENISECTOMY Right 05/30/2017   Procedure: KNEE ARTHROSCOPY WITH MEDIAL AND LATERAL  MENISECTOMY;  Surgeon: Kennedy Bucker, MD;  Location: [facility] OR[facility];  Service: Orthopedics;  Laterality: Right;   LEFT HEART CATH AND CORONARY ANGIOGRAPHY N/A 09/08/2021   Procedure: LEFT HEART CATH AND CORONARY ANGIOGRAPHY;  Surgeon: Iran Ouch, MD;  Location: [facility];  Service: Cardiovascular;  Laterality: N/A;   SYNOVECTOMY Right 05/30/2017   Procedure: SYNOVECTOMY;  Surgeon: Kennedy Bucker, MD;  Location: [facility];  Service: Orthopedics;  Laterality: Right;   TOTAL ABDOMINAL HYSTERECTOMY W/ BILATERAL SALPINGOOPHORECTOMY      Home Medications:  Allergies as of 03/04/2023   No Known Allergies      Medication List        Accurate as of March 04, 2023  1:03 PM. If you have any questions, ask your nurse or doctor.          ALPRAZolam 0.25 MG tablet Commonly known as: XANAX Take 1 tablet as needed for anxiety.   aspirin EC 81 MG tablet Take 81 mg by mouth daily. Swallow whole.   cilostazol 100 MG tablet Commonly known as: PLETAL Take 1 tablet (100 mg total) by mouth 2 (two) times daily.   ezetimibe 10 MG tablet  Commonly known as: ZETIA Take 1 tablet by mouth once daily   FreeStyle Libre 3 Plus Sensor Misc Change sensor every 15 days.   lidocaine 2 % solution Commonly known as: XYLOCAINE Use as directed 15 mLs in the mouth or throat as needed for mouth pain.   lisinopril 2.5 MG tablet Commonly known as: ZESTRIL Take 1 tablet (2.5 mg total) by mouth daily.   metFORMIN 500 MG 24 hr tablet Commonly known as: GLUCOPHAGE-XR TAKE 2 TABLETS BY MOUTH WITH BREAKFAST AND 2 TABLETS AT BEDTIME   metroNIDAZOLE 500 MG tablet Commonly known as: FLAGYL Take 1 tablet (500 mg total) by mouth 2 (two) times daily for 7 days.   ONE  TOUCH ULTRA 2 w/Device Kit 1 each by Does not apply route daily.   OneTouch Ultra test strip Generic drug: glucose blood TEST BLOOD SUGAR EVERY DAY   Ozempic (1 MG/DOSE) 4 MG/3ML Sopn Generic drug: Semaglutide (1 MG/DOSE) Inject 1 mg into the skin once a week.   rosuvastatin 20 MG tablet Commonly known as: CRESTOR Take 1 tablet (20 mg total) by mouth daily.   traZODone 50 MG tablet Commonly known as: DESYREL TAKE 1 TABLET (50 MG TOTAL) BY MOUTH AT BEDTIME AS NEEDED FOR SLEEP (CAN TAKE 1-2 TABS AS NEEDED ).        Allergies:  No Known Allergies  Family History: Family History  Problem Relation Age of Onset   Diabetes Father    Alcohol abuse Father    Early death Father    Heart disease Father    Stroke Father    Diabetes Brother    Alcohol abuse Brother    Early death Brother    Heart disease Brother    Stroke Brother    Diabetes Brother    Cancer Brother    Early death Brother    Early death Brother    Cancer Paternal Aunt        Breast   Breast cancer Neg Hx     Social History:   reports that she quit smoking about 2 years ago. Her smoking use included cigarettes. She started smoking about 43 years ago. She has a 20.5 pack-year smoking history. She has never used smokeless tobacco. She reports that she does not drink alcohol and does not use drugs.  Physical Exam: There were no vitals taken for this visit.  Constitutional:  Alert and oriented, no acute distress, nontoxic appearing HEENT: [place], AT Cardiovascular: No clubbing, cyanosis, or edema Respiratory: Normal respiratory effort, no increased work of breathing Skin: No rashes, bruises or suspicious lesions Neurologic: Grossly intact, no focal deficits, moving all 4 extremities Psychiatric: Normal mood and affect  Laboratory Data: Results for orders placed or performed in visit on 03/04/23  Microscopic Examination   Urine  Result Value Ref Range   WBC, UA >30 (A) 0 - 5 /hpf   RBC, Urine 3-10 (A) 0 -  2 /hpf   Epithelial Cells (non renal) >10 (A) 0 - 10 /hpf   Bacteria, UA Many (A) None seen/Few  Urinalysis, Complete  Result Value Ref Range   Specific Gravity, UA 1.020 1.005 - 1.030   pH, UA 6.5 5.0 - 7.5   Color, UA Yellow Yellow   Appearance Ur Cloudy (A) Clear   Leukocytes,UA 1+ (A) Negative   Protein,UA 1+ (A) Negative/Trace   Glucose, UA Negative Negative   Ketones, UA Negative Negative   RBC, UA 1+ (A) Negative   Bilirubin, UA Negative Negative  Urobilinogen, Ur 0.2 0.2 - 1.0 mg/dL   Nitrite, UA Positive (A) Negative   Microscopic Examination See below:    Pertinent Imaging: KUB, 03/04/2023: See Epic  I personally reviewed the images referenced above and no radiopaque ureteral stones.  There has been interval enlargement in her left lower pole stone burden, now measuring up to 21 mm in length.  Assessment & Plan:   1. Gross hematuria UA appears grossly infected today.  No radiopaque ureteral stones and she denies renal colic symptoms.  Will start empiric Cipro and send for culture for further evaluation.  Using shared decision making, we elected to defer CT stone study today but will keep a low threshold to pursue this in the future if her symptoms do not improve.  Will plan for repeat UA in 1 week to prove resolution of microscopic hematuria.  We discussed return precautions including fever, flank pain, nausea, and vomiting. - Urinalysis, Complete - ciprofloxacin (CIPRO) 250 MG tablet; Take 1 tablet (250 mg total) by mouth 2 (two) times daily for 5 days.  Dispense: 10 tablet; Refill: 0 - CULTURE, URINE COMPREHENSIVE   Return in about 1 week (around 03/11/2023) for Lab visit for UA.  Carman Ching, PA-C  [facility] [place] [address] [place], [place] [address] [phone]

## 2023-03-05 ENCOUNTER — Ambulatory Visit: Payer: Medicare HMO | Admitting: Nurse Practitioner

## 2023-03-06 LAB — URINE CULTURE

## 2023-03-06 NOTE — Progress Notes (Signed)
Hi Tammy Guerra, your urine culture indicates that you have a urinary tract infection. I see you were able to follow up with urology and was placed on the appropriate antibiotic for this. I recommend to continue with urology recommendations. Thank you for allowing me to participate in your care.

## 2023-03-07 LAB — CULTURE, URINE COMPREHENSIVE

## 2023-03-11 DIAGNOSIS — H52209 Unspecified astigmatism, unspecified eye: Secondary | ICD-10-CM | POA: Diagnosis not present

## 2023-03-11 DIAGNOSIS — H524 Presbyopia: Secondary | ICD-10-CM | POA: Diagnosis not present

## 2023-03-11 DIAGNOSIS — H5203 Hypermetropia, bilateral: Secondary | ICD-10-CM | POA: Diagnosis not present

## 2023-03-12 ENCOUNTER — Other Ambulatory Visit: Payer: Medicare HMO

## 2023-03-12 DIAGNOSIS — R310 Gross hematuria: Secondary | ICD-10-CM | POA: Diagnosis not present

## 2023-03-12 LAB — URINALYSIS, COMPLETE
Bilirubin, UA: NEGATIVE
Glucose, UA: NEGATIVE
Ketones, UA: NEGATIVE
Leukocytes,UA: NEGATIVE
Nitrite, UA: NEGATIVE
Protein,UA: NEGATIVE
RBC, UA: NEGATIVE
Specific Gravity, UA: 1.025 (ref 1.005–1.030)
Urobilinogen, Ur: 0.2 mg/dL (ref 0.2–1.0)
pH, UA: 5.5 (ref 5.0–7.5)

## 2023-03-12 LAB — MICROSCOPIC EXAMINATION

## 2023-03-29 ENCOUNTER — Other Ambulatory Visit: Payer: Self-pay

## 2023-03-29 DIAGNOSIS — N200 Calculus of kidney: Secondary | ICD-10-CM

## 2023-05-03 ENCOUNTER — Encounter: Payer: Medicare HMO | Admitting: Nurse Practitioner

## 2023-05-16 ENCOUNTER — Encounter: Payer: Self-pay | Admitting: Nurse Practitioner

## 2023-05-16 ENCOUNTER — Ambulatory Visit: Payer: Medicare HMO | Admitting: Nurse Practitioner

## 2023-05-16 VITALS — BP 122/63 | HR 62 | Temp 98.3°F | Ht 65.0 in | Wt 177.2 lb

## 2023-05-16 DIAGNOSIS — E119 Type 2 diabetes mellitus without complications: Secondary | ICD-10-CM

## 2023-05-16 DIAGNOSIS — F411 Generalized anxiety disorder: Secondary | ICD-10-CM | POA: Diagnosis not present

## 2023-05-16 DIAGNOSIS — Z0000 Encounter for general adult medical examination without abnormal findings: Secondary | ICD-10-CM | POA: Diagnosis not present

## 2023-05-16 DIAGNOSIS — J439 Emphysema, unspecified: Secondary | ICD-10-CM

## 2023-05-16 DIAGNOSIS — I739 Peripheral vascular disease, unspecified: Secondary | ICD-10-CM

## 2023-05-16 DIAGNOSIS — Z7985 Long-term (current) use of injectable non-insulin antidiabetic drugs: Secondary | ICD-10-CM

## 2023-05-16 DIAGNOSIS — E118 Type 2 diabetes mellitus with unspecified complications: Secondary | ICD-10-CM

## 2023-05-16 DIAGNOSIS — I200 Unstable angina: Secondary | ICD-10-CM | POA: Diagnosis not present

## 2023-05-16 DIAGNOSIS — I700 Atherosclerosis of aorta: Secondary | ICD-10-CM | POA: Diagnosis not present

## 2023-05-16 DIAGNOSIS — I25118 Atherosclerotic heart disease of native coronary artery with other forms of angina pectoris: Secondary | ICD-10-CM | POA: Diagnosis not present

## 2023-05-16 DIAGNOSIS — Z1231 Encounter for screening mammogram for malignant neoplasm of breast: Secondary | ICD-10-CM

## 2023-05-16 LAB — URINALYSIS, ROUTINE W REFLEX MICROSCOPIC
Bilirubin, UA: NEGATIVE
Glucose, UA: NEGATIVE
Ketones, UA: NEGATIVE
Leukocytes,UA: NEGATIVE
Nitrite, UA: NEGATIVE
Protein,UA: NEGATIVE
RBC, UA: NEGATIVE
Specific Gravity, UA: 1.025 (ref 1.005–1.030)
Urobilinogen, Ur: 1 mg/dL (ref 0.2–1.0)
pH, UA: 7 (ref 5.0–7.5)

## 2023-05-16 LAB — BAYER DCA HB A1C WAIVED: HB A1C (BAYER DCA - WAIVED): 6.7 % — ABNORMAL HIGH (ref 4.8–5.6)

## 2023-05-16 LAB — MICROALBUMIN, URINE WAIVED
Creatinine, Urine Waived: 300 mg/dL (ref 10–300)
Microalb, Ur Waived: 30 mg/L — ABNORMAL HIGH (ref 0–19)
Microalb/Creat Ratio: <30 mg/g (ref <30)

## 2023-05-16 MED ORDER — ALPRAZOLAM 0.25 MG PO TABS: ORAL_TABLET | ORAL | 1 refills | Status: DC

## 2023-05-16 NOTE — Assessment & Plan Note (Signed)
Chronic. Followed by Cardiology. Continue with Rosuvastatin and Zetia.   Labs ordered. Follow up in 6 months.  Call sooner if concerns arise.

## 2023-05-16 NOTE — Assessment & Plan Note (Signed)
Chronic.  Controlled.  CT Lung screening up to date.  Return to clinic in 6 months for reevaluation.  Call sooner if concerns arise. Smoking cessation since September 2022.  Encouraged patient to continue with smoking cessation.

## 2023-05-16 NOTE — Progress Notes (Signed)
BP 122/63 (BP Location: Right Arm, Patient Position: Sitting)   Pulse 62   Temp 98.3 F (36.8 C) (Oral)   Ht 5\' 5"  (1.651 m)   Wt 177 lb 3.2 oz (80.4 kg)   SpO2 96%   BMI 29.49 kg/m    Subjective:    Patient ID: Tammy Guerra, female    DOB: 12-09-1950, 73 y.o.   MRN: [id number]  HPI: Tammy Guerra is a 73 y.o. female presenting on 05/16/2023 for comprehensive medical examination. Current medical complaints include:none  She currently lives with: Menopausal Symptoms: no  DIABETES On 1mg  of Ozempic. Hypoglycemic episodes:no Polydipsia/polyuria: no Visual disturbance: no Chest pain: no Paresthesias: no Glucose Monitoring: no             Accucheck frequency: sometimes             Fasting glucose: 120             Post prandial:             Evening:             Before meals: Taking Insulin?: no             Long acting insulin:             Short acting insulin: Blood Pressure Monitoring: a few times a month Retinal Examination: Up to Date- scheduled with [facility] Foot Exam: Up to Date Diabetic Education: Not Completed Pneumovax: Not up to Date Influenza: Not up to Date Aspirin: no   COPD Doing well.  Denies concerns at visit today.   COPD status: controlled Satisfied with current treatment?: no Oxygen use: no Dyspnea frequency:  Cough frequency:  Rescue inhaler frequency:   Limitation of activity: no Productive cough:  Last Spirometry:  Pneumovax: Not up to Date Influenza: Not up to Date   ANXIETY Doing well.  Rarely uses it but does like to have it on hand.  Is due for a refill.  Depression Screen done today and results listed below:     05/16/2023   11:10 AM 03/01/2023    1:18 PM 01/31/2023    1:16 PM 08/30/2022    8:23 AM 08/14/2022   11:32 AM  Depression screen PHQ 2/9  Decreased Interest 0 0 0 0 0  Down, Depressed, Hopeless 0 0 0 1 1  PHQ - 2 Score 0 0 0 1 1  Altered sleeping 2 0 1 1 0  Tired, decreased energy 2 3 3 1 1   Change in  appetite 0 0 3 1 0  Feeling bad or failure about yourself  0 0 2 1 0  Trouble concentrating 0 1 0 0 0  Moving slowly or fidgety/restless 0 0 0 0 0  Suicidal thoughts 0 0 0 0 0  PHQ-9 Score 4 4 9 5 2   Difficult doing work/chores  Not difficult at all Not difficult at all Not difficult at all Not difficult at all    The patient does not have a history of falls. I did complete a risk assessment for falls. A plan of care for falls was documented.   Past Medical History:  Past Medical History:  Diagnosis Date   Anxiety    Arthritis    B12 deficiency anemia 11/13/2022   Cancer (HCC)    skin/CERVICAL CA   Complication of anesthesia    discomfort during first cataract   Diabetes mellitus without complication (HCC)    Emphysema lung (HCC) 07/01/2020  Emphysema lung (HCC) 07/01/2020   GERD (gastroesophageal reflux disease)    NO MEDS   History of kidney stones    STONES AND CYSTS   Kidney stone    Palpitations    Pancreatic cyst     Surgical History:  Past Surgical History:  Procedure Laterality Date   CARDIAC CATHETERIZATION     CATARACT EXTRACTION W/PHACO Left 09/07/2014   Procedure: CATARACT EXTRACTION PHACO AND INTRAOCULAR LENS PLACEMENT (IOC);  Surgeon: Galen Manila, MD;  Location: [facility] ORS;  Service: Ophthalmology;  Laterality: Left;  [place] 01:03 AP% 27.7 CDE 17.55   CORONARY ANGIOPLASTY     EYE SURGERY     cataract   KNEE ARTHROSCOPY WITH MEDIAL MENISECTOMY Right 05/30/2017   Procedure: KNEE ARTHROSCOPY WITH MEDIAL AND LATERAL  MENISECTOMY;  Surgeon: Kennedy Bucker, MD;  Location: [facility] ORS;  Service: Orthopedics;  Laterality: Right;   LEFT HEART CATH AND CORONARY ANGIOGRAPHY N/A 09/08/2021   Procedure: LEFT HEART CATH AND CORONARY ANGIOGRAPHY;  Surgeon: Iran Ouch, MD;  Location: [facility] INVASIVE CV LAB;  Service: Cardiovascular;  Laterality: N/A;   SYNOVECTOMY Right 05/30/2017   Procedure: SYNOVECTOMY;  Surgeon: Kennedy Bucker, MD;  Location: [facility] ORS;  Service:  Orthopedics;  Laterality: Right;   TOTAL ABDOMINAL HYSTERECTOMY W/ BILATERAL SALPINGOOPHORECTOMY      Medications:  Current Outpatient Medications on File Prior to Visit  Medication Sig   aspirin EC 81 MG tablet Take 81 mg by mouth daily. Swallow whole.   Blood Glucose Monitoring Suppl (ONE TOUCH ULTRA 2) w/Device KIT 1 each by Does not apply route daily.   cilostazol (PLETAL) 100 MG tablet Take 1 tablet (100 mg total) by mouth 2 (two) times daily.   Continuous Glucose Sensor (FREESTYLE LIBRE 3 PLUS SENSOR) MISC Change sensor every 15 days.   ezetimibe (ZETIA) 10 MG tablet Take 1 tablet by mouth once daily   glucose blood (ONETOUCH ULTRA) test strip TEST BLOOD SUGAR EVERY DAY   lidocaine (XYLOCAINE) 2 % solution Use as directed 15 mLs in the mouth or throat as needed for mouth pain.   lisinopril (ZESTRIL) 2.5 MG tablet Take 1 tablet (2.5 mg total) by mouth daily.   metFORMIN (GLUCOPHAGE-XR) 500 MG 24 hr tablet TAKE 2 TABLETS BY MOUTH WITH BREAKFAST AND 2 TABLETS AT BEDTIME   rosuvastatin (CRESTOR) 20 MG tablet Take 1 tablet (20 mg total) by mouth daily.   Semaglutide, 1 MG/DOSE, (OZEMPIC, 1 MG/DOSE,) 4 MG/3ML SOPN Inject 1 mg into the skin once a week.   traZODone (DESYREL) 50 MG tablet TAKE 1 TABLET (50 MG TOTAL) BY MOUTH AT BEDTIME AS NEEDED FOR SLEEP (CAN TAKE 1-2 TABS AS NEEDED ).   No current facility-administered medications on file prior to visit.    Allergies:  No Known Allergies  Social History:  Social History   Socioeconomic History   Marital status: Widowed    Spouse name: Not on file   Number of children: Not on file   Years of education: Not on file   Highest education level: 12th grade  Occupational History   Not on file  Tobacco Use   Smoking status: Former    Current packs/day: 0.00    Average packs/day: 0.5 packs/day for 41.0 years (20.5 ttl pk-yrs)    Types: Cigarettes    Start date: 12/20/1979    Quit date: 12/19/2020    Years since quitting: 2.4    Smokeless tobacco: Never   Tobacco comments:    15 CIG DAILY  Vaping  Use   Vaping status: Never Used  Substance and Sexual Activity   Alcohol use: No   Drug use: No   Sexual activity: Not Currently    Birth control/protection: Surgical  Other Topics Concern   Not on file  Social History Narrative   Lives alone.  Diane, daughter at bedside.   Social Drivers of Corporate investment banker Strain: Low Risk  (05/14/2023)   Overall Financial Resource Strain (CARDIA)    Difficulty of Paying Living Expenses: Not very hard  Recent Concern: Financial Resource Strain - Medium Risk (03/01/2023)   Overall Financial Resource Strain (CARDIA)    Difficulty of Paying Living Expenses: Somewhat hard  Food Insecurity: Food Insecurity Present (05/14/2023)   Hunger Vital Sign    Worried About Running Out of Food in the Last Year: Sometimes true    Ran Out of Food in the Last Year: Never true  Transportation Needs: No Transportation Needs (05/14/2023)   PRAPARE - Administrator, Civil Service (Medical): No    Lack of Transportation (Non-Medical): No  Physical Activity: Insufficiently Active (05/14/2023)   Exercise Vital Sign    Days of Exercise per Week: 1 day    Minutes of Exercise per Session: 10 min  Stress: Stress Concern Present (05/14/2023)   [employer] of Occupational Health - Occupational Stress Questionnaire    Feeling of Stress : Rather much  Social Connections: Moderately Isolated (05/14/2023)   Social Connection and Isolation Panel [NHANES]    Frequency of Communication with Friends and Family: More than three times a week    Frequency of Social Gatherings with Friends and Family: More than three times a week    Attends Religious Services: 1 to 4 times per year    Active Member of [employer] or Organizations: No    Attends Banker Meetings: Never    Marital Status: Widowed  Intimate Partner Violence: Not At Risk (08/14/2022)   Humiliation, Afraid, Rape, and Kick  questionnaire    Fear of Current or Ex-Partner: No    Emotionally Abused: No    Physically Abused: No    Sexually Abused: No   Social History   Tobacco Use  Smoking Status Former   Current packs/day: 0.00   Average packs/day: 0.5 packs/day for 41.0 years (20.5 ttl pk-yrs)   Types: Cigarettes   Start date: 12/20/1979   Quit date: 12/19/2020   Years since quitting: 2.4  Smokeless Tobacco Never  Tobacco Comments   15 CIG DAILY   Social History   Substance and Sexual Activity  Alcohol Use No    Family History:  Family History  Problem Relation Age of Onset   Diabetes Father    Alcohol abuse Father    Early death Father    Heart disease Father    Stroke Father    Diabetes Brother    Alcohol abuse Brother    Early death Brother    Heart disease Brother    Stroke Brother    Diabetes Brother    Cancer Brother    Early death Brother    Early death Brother    Cancer Paternal Aunt        Breast   Breast cancer Neg Hx     Past medical history, surgical history, medications, allergies, family history and social history reviewed with patient today and changes made to appropriate areas of the chart.   Review of Systems  HENT:         Denies  vision changes.  Eyes:  Negative for blurred vision and double vision.  Respiratory:  Negative for shortness of breath.   Cardiovascular:  Negative for chest pain, palpitations and leg swelling.  Neurological:  Negative for dizziness, tingling and headaches.  Endo/Heme/Allergies:  Negative for polydipsia.       Denies Polyuria  Psychiatric/Behavioral:  The patient is nervous/anxious.    All other ROS negative except what is listed above and in the HPI.      Objective:    BP 122/63 (BP Location: Right Arm, Patient Position: Sitting)   Pulse 62   Temp 98.3 F (36.8 C) (Oral)   Ht 5\' 5"  (1.651 m)   Wt 177 lb 3.2 oz (80.4 kg)   SpO2 96%   BMI 29.49 kg/m   Wt Readings from Last 3 Encounters:  05/16/23 177 lb 3.2 oz (80.4 kg)   03/04/23 182 lb 2 oz (82.6 kg)  03/01/23 181 lb (82.1 kg)    Physical Exam Vitals and nursing note reviewed.  Constitutional:      General: She is awake. She is not in acute distress.    Appearance: Normal appearance. She is well-developed. She is not ill-appearing.  HENT:     Head: Normocephalic and atraumatic.     Right Ear: Hearing, tympanic membrane, ear canal and external ear normal. No drainage.     Left Ear: Hearing, tympanic membrane, ear canal and external ear normal. No drainage.     Nose: Nose normal.     Right Sinus: No maxillary sinus tenderness or frontal sinus tenderness.     Left Sinus: No maxillary sinus tenderness or frontal sinus tenderness.     Mouth/Throat:     Mouth: Mucous membranes are moist.     Pharynx: Oropharynx is clear. Uvula midline. No pharyngeal swelling, oropharyngeal exudate or posterior oropharyngeal erythema.  Eyes:     General: Lids are normal.        Right eye: No discharge.        Left eye: No discharge.     Extraocular Movements: Extraocular movements intact.     Conjunctiva/sclera: Conjunctivae normal.     Pupils: Pupils are equal, round, and reactive to light.     Visual Fields: Right eye visual fields normal and left eye visual fields normal.  Neck:     Thyroid: No thyromegaly.     Vascular: No carotid bruit.     Trachea: Trachea normal.  Cardiovascular:     Rate and Rhythm: Normal rate and regular rhythm.     Heart sounds: Normal heart sounds. No murmur heard.    No gallop.  Pulmonary:     Effort: Pulmonary effort is normal. No accessory muscle usage or respiratory distress.     Breath sounds: Normal breath sounds.  Chest:  Breasts:    Right: Normal.     Left: Normal.  Abdominal:     General: Bowel sounds are normal.     Palpations: Abdomen is soft. There is no hepatomegaly or splenomegaly.     Tenderness: There is no abdominal tenderness.  Musculoskeletal:        General: Normal range of motion.     Cervical back: Normal  range of motion and neck supple.     Right lower leg: No edema.     Left lower leg: No edema.  Lymphadenopathy:     Head:     Right side of head: No submental, submandibular, tonsillar, preauricular or posterior auricular adenopathy.  Left side of head: No submental, submandibular, tonsillar, preauricular or posterior auricular adenopathy.     Cervical: No cervical adenopathy.     Upper Body:     Right upper body: No supraclavicular, axillary or pectoral adenopathy.     Left upper body: No supraclavicular, axillary or pectoral adenopathy.  Skin:    General: Skin is warm and dry.     Capillary Refill: Capillary refill takes less than 2 seconds.     Findings: No rash.  Neurological:     Mental Status: She is alert and oriented to person, place, and time.     Gait: Gait is intact.  Psychiatric:        Attention and Perception: Attention normal.        Mood and Affect: Mood normal.        Speech: Speech normal.        Behavior: Behavior normal. Behavior is cooperative.        Thought Content: Thought content normal.        Judgment: Judgment normal.     Results for orders placed or performed in visit on 03/12/23  Microscopic Examination   Collection Time: 03/12/23  1:20 PM   Urine  Result Value Ref Range   WBC, UA 0-5 0 - 5 /hpf   RBC, Urine 0-2 0 - 2 /hpf   Epithelial Cells (non renal) 0-10 0 - 10 /hpf   Crystals Present (A) N/A   Crystal Type Calcium Oxalate N/A   Mucus, UA Present (A) Not Estab.   Bacteria, UA Few None seen/Few  Urinalysis, Complete   Collection Time: 03/12/23  1:20 PM  Result Value Ref Range   Specific Gravity, UA 1.025 1.005 - 1.030   pH, UA 5.5 5.0 - 7.5   Color, UA Yellow Yellow   Appearance Ur Clear Clear   Leukocytes,UA Negative Negative   Protein,UA Negative Negative/Trace   Glucose, UA Negative Negative   Ketones, UA Negative Negative   RBC, UA Negative Negative   Bilirubin, UA Negative Negative   Urobilinogen, Ur 0.2 0.2 - 1.0 mg/dL    Nitrite, UA Negative Negative   Microscopic Examination See below:       Assessment & Plan:   Problem List Items Addressed This Visit       Cardiovascular and Mediastinum   PAD (peripheral artery disease) (HCC)   Chronic. Followed by cardiology.  Continue to follow per their recommendations. Recommend continued smoking cessation.      Aortic atherosclerosis (HCC)   Chronic. Followed by Cardiology. Continue with Rosuvastatin daily and Zetia.  Labs ordered. Follow up in 6 months.  Call sooner if concerns arise.      Relevant Orders   Lipid panel   Coronary artery disease   Chronic. Followed by Cardiology. Continue with Rosuvastatin and Zetia.   Labs ordered. Follow up in 6 months.  Call sooner if concerns arise.      Accelerating angina (HCC)   Chronic.  Followed by cardiology.  Continue with collaboration.        Respiratory   Emphysema lung (HCC)   Chronic.  Controlled.  CT Lung screening up to date.  Return to clinic in 6 months for reevaluation.  Call sooner if concerns arise. Smoking cessation since September 2022.  Encouraged patient to continue with smoking cessation.        Endocrine   Diabetes mellitus type 2 with complications (HCC)   Chronic.  Controlled.  Continue with current medication regimen.  On  Ozempic via PAP.  Doing well with Metformin 500mg  BID.  Last A1c was 6.7% in office today.  Continue with Ozempic 1mg   Labs ordered today.  Return to clinic in 6 months for reevaluation.  Call sooner if concerns arise.       Relevant Orders   Microalbumin, Urine Waived   Bayer DCA Hb A1c Waived   Diabetes mellitus treated with injections of non-insulin medication (HCC)     Other   Generalized anxiety disorder (Chronic)   Chronic. Ongoing.  Rarely uses Xanax.  Refilled today.  Last refill was in October.  30 pills should last around 45 days.  Will refill before next appt, if needed.  Follow up in 6 months for reevaluation.  Call sooner if concerns arise.        Relevant Medications   ALPRAZolam (XANAX) 0.25 MG tablet   Other Visit Diagnoses       Annual physical exam    -  Primary   Health maintenance reviewed during visit today.  Mammogram ordered.  Vaccines reviewed.  Colonoscopy up to date   Relevant Orders   CBC with Differential/Platelet   Comprehensive metabolic panel   Lipid panel   TSH   Urinalysis, Routine w reflex microscopic   Microalbumin, Urine Waived     Encounter for screening mammogram for malignant neoplasm of breast       Relevant Orders   MM 3D SCREENING MAMMOGRAM BILATERAL BREAST        Follow up plan: No follow-ups on file.   LABORATORY TESTING:  - Pap smear: not applicable  IMMUNIZATIONS:   - Tdap: Tetanus vaccination status reviewed: Medicare. - Influenza: Refused - Pneumovax: Up to date - Prevnar: Up to date - COVID: Up to date - HPV: Not applicable - Shingrix vaccine:  Discussed at visit  SCREENING: -Mammogram: Ordered today  - Colonoscopy: Up to date  - Bone Density: up to date -Hearing Test: Not applicable  -Spirometry: Not applicable   PATIENT COUNSELING:   Advised to take 1 mg of folate supplement per day if capable of pregnancy.   Sexuality: Discussed sexually transmitted diseases, partner selection, use of condoms, avoidance of unintended pregnancy  and contraceptive alternatives.   Advised to avoid cigarette smoking.  I discussed with the patient that most people either abstain from alcohol or drink within safe limits (<=14/week and <=4 drinks/occasion for males, <=7/weeks and <= 3 drinks/occasion for females) and that the risk for alcohol disorders and other health effects rises proportionally with the number of drinks per week and how often a drinker exceeds daily limits.  Discussed cessation/primary prevention of drug use and availability of treatment for abuse.   Diet: Encouraged to adjust caloric intake to maintain  or achieve ideal body weight, to reduce intake of dietary saturated  fat and total fat, to limit sodium intake by avoiding high sodium foods and not adding table salt, and to maintain adequate dietary potassium and calcium preferably from fresh fruits, vegetables, and low-fat dairy products.    stressed the importance of regular exercise  Injury prevention: Discussed safety belts, safety helmets, smoke detector, smoking near bedding or upholstery.   Dental health: Discussed importance of regular tooth brushing, flossing, and dental visits.    NEXT PREVENTATIVE PHYSICAL DUE IN 1 YEAR. No follow-ups on file.

## 2023-05-16 NOTE — Assessment & Plan Note (Signed)
Chronic.  Controlled.  Continue with current medication regimen.  On Ozempic via PAP.  Doing well with Metformin 500mg  BID.  Last A1c was 6.7% in office today.  Continue with Ozempic 1mg   Labs ordered today.  Return to clinic in 6 months for reevaluation.  Call sooner if concerns arise.

## 2023-05-16 NOTE — Assessment & Plan Note (Signed)
Chronic. Ongoing.  Rarely uses Xanax.  Refilled today.  Last refill was in October.  30 pills should last around 45 days.  Will refill before next appt, if needed.  Follow up in 6 months for reevaluation.  Call sooner if concerns arise.

## 2023-05-16 NOTE — Assessment & Plan Note (Signed)
Chronic. Followed by cardiology.  Continue to follow per their recommendations. Recommend continued smoking cessation.

## 2023-05-16 NOTE — Assessment & Plan Note (Signed)
Chronic. Followed by Cardiology. Continue with Rosuvastatin daily and Zetia.  Labs ordered. Follow up in 6 months.  Call sooner if concerns arise.

## 2023-05-16 NOTE — Assessment & Plan Note (Signed)
Chronic.  Followed by cardiology.  Continue with collaboration.

## 2023-05-17 ENCOUNTER — Encounter: Payer: Self-pay | Admitting: Nurse Practitioner

## 2023-05-17 LAB — CBC WITH DIFFERENTIAL/PLATELET
Basophils Absolute: 0.1 10*3/uL (ref 0.0–0.2)
Basos: 1 %
EOS (ABSOLUTE): 0.1 10*3/uL (ref 0.0–0.4)
Eos: 1 %
Hematocrit: 39.7 % (ref 34.0–46.6)
Hemoglobin: 13.1 g/dL (ref 11.1–15.9)
Immature Grans (Abs): 0 10*3/uL (ref 0.0–0.1)
Immature Granulocytes: 0 %
Lymphocytes Absolute: 2.5 10*3/uL (ref 0.7–3.1)
Lymphs: 30 %
MCH: 30.5 pg (ref 26.6–33.0)
MCHC: 33 g/dL (ref 31.5–35.7)
MCV: 92 fL (ref 79–97)
Monocytes Absolute: 0.6 10*3/uL (ref 0.1–0.9)
Monocytes: 7 %
Neutrophils Absolute: 5.1 10*3/uL (ref 1.4–7.0)
Neutrophils: 61 %
Platelets: 324 10*3/uL (ref 150–450)
RBC: 4.3 x10E6/uL (ref 3.77–5.28)
RDW: 12.4 % (ref 11.7–15.4)
WBC: 8.4 10*3/uL (ref 3.4–10.8)

## 2023-05-17 LAB — LIPID PANEL
Chol/HDL Ratio: 2 {ratio} (ref 0.0–4.4)
Cholesterol, Total: 110 mg/dL (ref 100–199)
HDL: 54 mg/dL (ref >39)
LDL Chol Calc (NIH): 40 mg/dL (ref 0–99)
Triglycerides: 78 mg/dL (ref 0–149)
VLDL Cholesterol Cal: 16 mg/dL (ref 5–40)

## 2023-05-17 LAB — COMPREHENSIVE METABOLIC PANEL
ALT: 18 [IU]/L (ref 0–32)
AST: 15 [IU]/L (ref 0–40)
Albumin: 4.5 g/dL (ref 3.8–4.8)
Alkaline Phosphatase: 80 [IU]/L (ref 44–121)
BUN/Creatinine Ratio: 14 (ref 12–28)
BUN: 9 mg/dL (ref 8–27)
Bilirubin Total: 0.4 mg/dL (ref 0.0–1.2)
CO2: 24 mmol/L (ref 20–29)
Calcium: 9.5 mg/dL (ref 8.7–10.3)
Chloride: 101 mmol/L (ref 96–106)
Creatinine, Ser: 0.63 mg/dL (ref 0.57–1.00)
Globulin, Total: 2.1 g/dL (ref 1.5–4.5)
Glucose: 119 mg/dL — ABNORMAL HIGH (ref 70–99)
Potassium: 4.2 mmol/L (ref 3.5–5.2)
Sodium: 139 mmol/L (ref 134–144)
Total Protein: 6.6 g/dL (ref 6.0–8.5)
eGFR: 95 mL/min/{1.73_m2} (ref >59)

## 2023-05-17 LAB — TSH: TSH: 0.793 u[IU]/mL (ref 0.450–4.500)

## 2023-05-27 ENCOUNTER — Encounter: Payer: Self-pay | Admitting: Nurse Practitioner

## 2023-05-27 ENCOUNTER — Other Ambulatory Visit: Payer: Self-pay

## 2023-05-27 DIAGNOSIS — E118 Type 2 diabetes mellitus with unspecified complications: Secondary | ICD-10-CM

## 2023-05-27 MED ORDER — OZEMPIC (1 MG/DOSE) 4 MG/3ML ~~LOC~~ SOPN: 1.0000 mg | PEN_INJECTOR | SUBCUTANEOUS | 0 refills | Status: DC

## 2023-05-28 ENCOUNTER — Telehealth: Payer: Self-pay

## 2023-05-28 NOTE — Telephone Encounter (Signed)
 Called patient to inform that their shipment through PAP (Patient Assistance Program) has been received in office and is available for pickup Monday-Friday 8am - 5pm (lunch 12 pm-12:45pm office closed).  Patient aware to receive they will need to sign and date the pick up paperwork at the front desk folder.   Company: [employer]  Medication received: Ozempic  1 mg How many units: 4 boxes

## 2023-05-28 NOTE — Telephone Encounter (Signed)
Pt called back and stated that she might not be able to get to the office until 06/11/2023 to pick up the medication, but she will come and pick it up.

## 2023-06-01 ENCOUNTER — Encounter: Payer: Self-pay | Admitting: Cardiovascular Disease

## 2023-06-20 ENCOUNTER — Other Ambulatory Visit: Payer: Self-pay | Admitting: Nurse Practitioner

## 2023-06-20 DIAGNOSIS — E1165 Type 2 diabetes mellitus with hyperglycemia: Secondary | ICD-10-CM

## 2023-06-20 NOTE — Telephone Encounter (Signed)
 Requested Prescriptions  Refused Prescriptions Disp Refills   lisinopril (ZESTRIL) 2.5 MG tablet [Pharmacy Med Name: Lisinopril 2.5 MG Oral Tablet] 90 tablet 0    Sig: Take 1 tablet by mouth once daily     Cardiovascular:  ACE Inhibitors Passed - 06/20/2023  5:11 PM      Passed - Cr in normal range and within 180 days    Creatinine, Ser  Date Value Ref Range Status  05/16/2023 0.63 0.57 - 1.00 mg/dL Final         Passed - K in normal range and within 180 days    Potassium  Date Value Ref Range Status  05/16/2023 4.2 3.5 - 5.2 mmol/L Final         Passed - Patient is not pregnant      Passed - Last BP in normal range    BP Readings from Last 1 Encounters:  05/16/23 122/63         Passed - Valid encounter within last 6 months    Recent Outpatient Visits           1 month ago Annual physical exam   [place] [facility] Larae Grooms, NP   3 months ago Bacterial vaginosis   [place] [facility] [place], Sherran Needs, NP   4 months ago PAD (peripheral artery disease) [facility])   [place] [facility] Larae Grooms, NP   9 months ago PAD (peripheral artery disease) [facility])   [place] [facility] Larae Grooms, NP   1 year ago Aortic atherosclerosis [facility])   [place] [facility] Larae Grooms, NP       Future Appointments             In 4 months Larae Grooms, NP  [facility], [facility]   In 8 months Carman Ching, [facility] [place]

## 2023-06-21 ENCOUNTER — Other Ambulatory Visit: Payer: Self-pay

## 2023-06-21 ENCOUNTER — Telehealth: Payer: Self-pay

## 2023-06-21 DIAGNOSIS — E1165 Type 2 diabetes mellitus with hyperglycemia: Secondary | ICD-10-CM

## 2023-06-21 NOTE — Telephone Encounter (Signed)
 Contacted [facility] because RX was sent in 01/31/23 for #90 with 1 refill. Should not be due until April. Pharmacy states that the patient picked up a 90 day supply on 04/28/23.  Contacted patient and notified her of the above information. Patient states she is away from home right now but will check her medication when she gets home and will call us back or send a mychart message if she needs too.    Copied from CRM [id number]. Topic: Clinical - Prescription Issue >> Jun 21, 2023  9:57 AM Tammy Guerra wrote: Reason for CRM: lisinopril (ZESTRIL) 2.5 MG tablet- pharmacy states refill was denied

## 2023-06-24 MED ORDER — LISINOPRIL 2.5 MG PO TABS: 2.5000 mg | ORAL_TABLET | Freq: Every day | ORAL | 1 refills | Status: DC

## 2023-06-25 DIAGNOSIS — Z1231 Encounter for screening mammogram for malignant neoplasm of breast: Secondary | ICD-10-CM | POA: Diagnosis not present

## 2023-06-25 LAB — HM MAMMOGRAPHY

## 2023-07-24 ENCOUNTER — Other Ambulatory Visit: Payer: Self-pay | Admitting: Nurse Practitioner

## 2023-07-25 NOTE — Telephone Encounter (Signed)
 Requested Prescriptions  Pending Prescriptions Disp Refills   rosuvastatin (CRESTOR) 20 MG tablet [Pharmacy Med Name: Rosuvastatin Calcium 20 MG Oral Tablet] 90 tablet 2    Sig: Take 1 tablet by mouth once daily     Cardiovascular:  Antilipid - Statins 2 Failed - 07/25/2023  2:55 PM      Failed - Valid encounter within last 12 months    Recent Outpatient Visits   None     Future Appointments             In 3 months Larae Grooms, NP [facility] [facility]   In 7 months Vaillancourt, [place], PA-C [facility] [place]            Failed - Lipid Panel in normal range within the last 12 months    Cholesterol, Total  Date Value Ref Range Status  05/16/2023 110 100 - 199 mg/dL Final   LDL Chol Calc (NIH)  Date Value Ref Range Status  05/16/2023 40 0 - 99 mg/dL Final   HDL  Date Value Ref Range Status  05/16/2023 54 >39 mg/dL Final   Triglycerides  Date Value Ref Range Status  05/16/2023 78 0 - 149 mg/dL Final         Passed - Cr in normal range and within 360 days    Creatinine, Ser  Date Value Ref Range Status  05/16/2023 0.63 0.57 - 1.00 mg/dL Final         Passed - Patient is not pregnant

## 2023-08-19 ENCOUNTER — Telehealth: Payer: Self-pay | Admitting: Nurse Practitioner

## 2023-08-19 NOTE — Telephone Encounter (Signed)
 Copied from CRM [id number]. Topic: Medicare AWV >> Aug 19, 2023  2:23 PM Juliana Ocean wrote: Reason for CRM: Called 08/19/23 to confirm AWV appt [facility]   Rosalee Collins; Care Guide Ambulatory Clinical Support [place] [facility] Direct Dial: [phone]

## 2023-08-20 ENCOUNTER — Ambulatory Visit: Payer: Self-pay | Admitting: Emergency Medicine

## 2023-08-20 VITALS — Ht 65.0 in | Wt 170.0 lb

## 2023-08-20 DIAGNOSIS — Z0000 Encounter for general adult medical examination without abnormal findings: Secondary | ICD-10-CM | POA: Diagnosis not present

## 2023-08-20 NOTE — Patient Instructions (Addendum)
 Tammy Guerra , Thank you for taking time to come for your Medicare Wellness Visit. I appreciate your ongoing commitment to your health goals. Please review the following plan we discussed and let me know if I can assist you in the future.   Referrals/Orders/Follow-Ups/Clinician Recommendations: Get a tetanus vaccine at your local pharmacy. You will be due for a low dose lung CT scan 12/12/23. Call [facility] @ [phone] to schedule at your convenience. Your last low dose lung CT was at [facility] on [address]  This is a list of the screening recommended for you and due dates:  Health Maintenance  Topic Date Due   DTaP/Tdap/Td vaccine (1 - Tdap) Never done   Zoster (Shingles) Vaccine (1 of 2) Never done   COVID-19 Vaccine (3 - Pfizer risk series) 08/06/2019   Complete foot exam   06/02/2023   Hemoglobin A1C  11/13/2023   Flu Shot  11/22/2023   Screening for Lung Cancer  12/11/2023   Eye exam for diabetics  02/14/2024   Yearly kidney function blood test for diabetes  05/15/2024   Yearly kidney health urinalysis for diabetes  05/15/2024   Mammogram  06/24/2024   Medicare Annual Wellness Visit  08/19/2024   DEXA scan (bone density measurement)  10/26/2025   Colon Cancer Screening  11/16/2029   Pneumonia Vaccine  Completed   Hepatitis C Screening  Completed   HPV Vaccine  Aged Out   Meningitis B Vaccine  Aged Out    Advanced directives: (ACP Link)Information on Advanced Care Planning can be found at [place]  Best boy Advance Health Care Directives Advance Health Care Directives. [email or web address] You may also get these forms at your doctor's office.  Once you have completed the forms, please bring a copy of your health care power of attorney and living will to the office to be added to your chart at your convenience.   Next Medicare Annual Wellness Visit scheduled for next year: Yes, 09/01/24 @ 10:00am (phone visit)

## 2023-08-20 NOTE — Progress Notes (Signed)
 Subjective:   Tammy Guerra is a 73 y.o. who presents for a Medicare Wellness preventive visit.  Visit Complete: Virtual I connected with  [name] on 08/20/23 by a audio enabled telemedicine application and verified that I am speaking with the correct person using two identifiers.  Patient Location: Home  Provider Location: Office/Clinic  I discussed the limitations of evaluation and management by telemedicine. The patient expressed understanding and agreed to proceed.  Vital Signs: Because this visit was a virtual/telehealth visit, some criteria may be missing or patient reported. Any vitals not documented were not able to be obtained and vitals that have been documented are patient reported.  VideoDeclined- This patient declined Librarian, academic. Therefore the visit was completed with audio only.  Persons Participating in Visit: Patient.  AWV Questionnaire: Yes: Patient Medicare AWV questionnaire was completed by the patient on 08/19/23; I have confirmed that all information answered by patient is correct and no changes since this date.  Cardiac Risk Factors include: advanced age (>84men, >51 women);diabetes mellitus;Other (see comment), Risk factor comments: CAD     Objective:    Today's Vitals   08/20/23 1118  Weight: 170 lb (77.1 kg)  Height: 5\' 5"  (1.651 m)   Body mass index is 28.29 kg/m.     08/20/2023   11:29 AM 08/14/2022   11:33 AM 09/08/2021    8:53 AM 09/01/2021   12:00 PM 08/09/2021    8:37 AM 08/01/2020   10:35 AM 08/20/2019    9:17 AM  Advanced Directives  Does Patient Have a Medical Advance Directive? No No No No No No No  Would patient like information on creating a medical advance directive? Yes (MAU/Ambulatory/Procedural Areas - Information given) No - Patient declined Yes (MAU/Ambulatory/Procedural Areas - Information given) No - Patient declined No - Patient declined  No - Patient declined    Current Medications  (verified) Outpatient Encounter Medications as of 08/20/2023  Medication Sig   ALPRAZolam  (XANAX ) 0.25 MG tablet Take 1 tablet as needed for anxiety.   aspirin  EC 81 MG tablet Take 81 mg by mouth daily. Swallow whole.   Blood Glucose Monitoring Suppl (ONE TOUCH ULTRA 2) w/Device KIT 1 each by Does not apply route daily.   cilostazol  (PLETAL ) 100 MG tablet Take 1 tablet (100 mg total) by mouth 2 (two) times daily.   Continuous Glucose Sensor (FREESTYLE LIBRE 3 PLUS SENSOR) MISC Change sensor every 15 days.   ezetimibe  (ZETIA ) 10 MG tablet Take 1 tablet by mouth once daily   glucose blood (ONETOUCH ULTRA) test strip TEST BLOOD SUGAR EVERY DAY   lisinopril  (ZESTRIL ) 2.5 MG tablet Take 1 tablet (2.5 mg total) by mouth daily.   metFORMIN  (GLUCOPHAGE -XR) 500 MG 24 hr tablet TAKE 2 TABLETS BY MOUTH WITH BREAKFAST AND 2 TABLETS AT BEDTIME   rosuvastatin  (CRESTOR ) 20 MG tablet Take 1 tablet by mouth once daily   Semaglutide , 1 MG/DOSE, (OZEMPIC , 1 MG/DOSE,) 4 MG/3ML SOPN Inject 1 mg into the skin once a week.   traZODone  (DESYREL ) 50 MG tablet TAKE 1 TABLET (50 MG TOTAL) BY MOUTH AT BEDTIME AS NEEDED FOR SLEEP (CAN TAKE 1-2 TABS AS NEEDED ).   lidocaine  (XYLOCAINE ) 2 % solution Use as directed 15 mLs in the mouth or throat as needed for mouth pain. (Patient not taking: Reported on 08/20/2023)   No facility-administered encounter medications on file as of 08/20/2023.    Allergies (verified) Patient has no known allergies.   History:  Past Medical History:  Diagnosis Date   Anxiety    Arthritis    B12 deficiency anemia 11/13/2022   Cancer (HCC)    skin/CERVICAL CA   Complication of anesthesia    discomfort during first cataract   Diabetes mellitus without complication (HCC)    Emphysema lung (HCC) 07/01/2020   Emphysema lung (HCC) 07/01/2020   GERD (gastroesophageal reflux disease)    NO MEDS   History of kidney stones    STONES AND CYSTS   Kidney stone    Palpitations    Pancreatic cyst     Past Surgical History:  Procedure Laterality Date   CARDIAC CATHETERIZATION     CATARACT EXTRACTION W/PHACO Left 09/07/2014   Procedure: CATARACT EXTRACTION PHACO AND INTRAOCULAR LENS PLACEMENT (IOC);  Surgeon: Clair Crews, MD;  Location: [facility] ORS;  Service: Ophthalmology;  Laterality: Left;  US  01:03 AP% 27.7 CDE 17.55   CORONARY ANGIOPLASTY     EYE SURGERY     cataract   KNEE ARTHROSCOPY WITH MEDIAL MENISECTOMY Right 05/30/2017   Procedure: KNEE ARTHROSCOPY WITH MEDIAL AND LATERAL  MENISECTOMY;  Surgeon: Molli Angelucci, MD;  Location: [facility] ORS;  Service: Orthopedics;  Laterality: Right;   LEFT HEART CATH AND CORONARY ANGIOGRAPHY N/A 09/08/2021   Procedure: LEFT HEART CATH AND CORONARY ANGIOGRAPHY;  Surgeon: Wenona Hamilton, MD;  Location: [facility] INVASIVE CV LAB;  Service: Cardiovascular;  Laterality: N/A;   SYNOVECTOMY Right 05/30/2017   Procedure: SYNOVECTOMY;  Surgeon: Molli Angelucci, MD;  Location: [facility] ORS;  Service: Orthopedics;  Laterality: Right;   TOTAL ABDOMINAL HYSTERECTOMY W/ BILATERAL SALPINGOOPHORECTOMY     Family History  Problem Relation Age of Onset   Diabetes Father    Alcohol abuse Father    Early death Father    Heart disease Father    Stroke Father    Diabetes Brother    Alcohol abuse Brother    Early death Brother    Heart disease Brother    Stroke Brother    Diabetes Brother    Cancer Brother    Early death Brother    Early death Brother    Cancer Paternal Aunt        Breast   Breast cancer Neg Hx    Social History   Socioeconomic History   Marital status: Widowed    Spouse name: Not on file   Number of children: 2   Years of education: Not on file   Highest education level: 12th grade  Occupational History   Occupation: retired  Tobacco Use   Smoking status: Former    Current packs/day: 0.00    Average packs/day: 0.5 packs/day for 41.0 years (20.5 ttl pk-yrs)    Types: Cigarettes    Start date: 12/20/1979    Quit date: 12/19/2020     Years since quitting: 2.6   Smokeless tobacco: Never   Tobacco comments:    15 CIG DAILY  Vaping Use   Vaping status: Never Used  Substance and Sexual Activity   Alcohol use: No   Drug use: No   Sexual activity: Not Currently    Birth control/protection: Surgical  Other Topics Concern   Not on file  Social History Narrative   Lives alone.  Diane, daughter at bedside.   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   Social Drivers of Corporate investment banker Strain: Medium Risk (08/20/2023)   Overall Financial Resource Strain (CARDIA)    Difficulty of Paying Living Expenses: Somewhat hard  Food  Insecurity: No Food Insecurity (08/20/2023)   Hunger Vital Sign    Worried About Running Out of Food in the Last Year: Never true    Ran Out of Food in the Last Year: Never true  Transportation Needs: No Transportation Needs (08/20/2023)   PRAPARE - Administrator, Civil Service (Medical): No    Lack of Transportation (Non-Medical): No  Physical Activity: Inactive (08/20/2023)   Exercise Vital Sign    Days of Exercise per Week: 0 days    Minutes of Exercise per Session: 0 min  Stress: Stress Concern Present (08/20/2023)   [employer] of Occupational Health - Occupational Stress Questionnaire    Feeling of Stress : Rather much  Social Connections: Socially Isolated (08/20/2023)   Social Connection and Isolation Panel [NHANES]    Frequency of Communication with Friends and Family: More than three times a week    Frequency of Social Gatherings with Friends and Family: More than three times a week    Attends Religious Services: Never    Database administrator or Organizations: No    Attends Banker Meetings: Never    Marital Status: Widowed    Tobacco Counseling Counseling given: Not Answered Tobacco comments: 15 CIG DAILY    Clinical Intake:  Pre-visit preparation completed: Yes  Pain : No/denies pain     BMI - recorded: 28.29 Nutritional Status:  BMI 25 -29 Overweight Nutritional Risks: None Diabetes: Yes CBG done?: No (FBS 120 per patient) Did pt. bring in CBG monitor from home?: No  Lab Results  Component Value Date   HGBA1C 6.7 (H) 05/16/2023   HGBA1C 7.2 (H) 01/31/2023   HGBA1C 8.1 (H) 08/30/2022     How often do you need to have someone help you when you read instructions, pamphlets, or other written materials from your doctor or pharmacy?: 1 - Never  Interpreter Needed?: No  Information entered by :: Jaunita Messier, CMA   Activities of Daily Living     08/20/2023   11:21 AM 08/19/2023    9:41 AM  In your present state of health, do you have any difficulty performing the following activities:  Hearing? 0 0  Vision? 0 0  Difficulty concentrating or making decisions? 0 0  Walking or climbing stairs? 0 0  Dressing or bathing? 0 0  Doing errands, shopping? 0 0  Preparing Food and eating ? N N  Using the Toilet? N N  In the past six months, have you accidently leaked urine? N N  Do you have problems with loss of bowel control? N N  Managing your Medications? N N  Managing your Finances? N N  Housekeeping or managing your Housekeeping? N N    Patient Care Team: Aileen Alexanders, NP as PCP - General (Nurse Practitioner) Wenona Hamilton, MD as PCP - Cardiology (Cardiology) Devon Fogo, [facility] (Inactive) (Pharmacist) Clair Crews, MD as Referring Physician (Ophthalmology) Kathreen Pare, PA-C as Physician Assistant (Urology) Mariane Shire, MD as Referring Physician (Dermatology)  Indicate any recent Medical Services you may have received from other than [facility] providers in the past year (date may be approximate).     Assessment:   This is a routine wellness examination for [place].  Hearing/Vision screen Hearing Screening - Comments:: Denies hearing loss Vision Screening - Comments:: Gets DM eye exams, Dr. Lafrances Pigeon [place]   Goals Addressed               This Visit's  Progress  Exercise 3x per week (30 min per time) (pt-stated)         Depression Screen     08/20/2023   11:26 AM 05/16/2023   11:10 AM 03/01/2023    1:18 PM 01/31/2023    1:16 PM 08/30/2022    8:23 AM 08/14/2022   11:32 AM 06/01/2022   10:39 AM  PHQ 2/9 Scores  PHQ - 2 Score 1 0 0 0 1 1 0  PHQ- 9 Score 2 4 4 9 5 2 8     Fall Risk     08/20/2023   11:30 AM 08/19/2023    9:41 AM 01/31/2023    1:16 PM 08/30/2022    8:23 AM 08/14/2022   11:34 AM  Fall Risk   Falls in the past year? 0 0 0 0 0  Number falls in past yr: 0 0 0 0 0  Injury with Fall? 0 0 0 0 0  Risk for fall due to : No Fall Risks  No Fall Risks  No Fall Risks  Follow up Falls prevention discussed;Falls evaluation completed  Falls evaluation completed Falls evaluation completed Falls prevention discussed;Falls evaluation completed    MEDICARE RISK AT HOME:  Medicare Risk at Home Any stairs in or around the home?: Yes If so, are there any without handrails?: No Home free of loose throw rugs in walkways, pet beds, electrical cords, etc?: Yes Adequate lighting in your home to reduce risk of falls?: Yes Life alert?: No Use of a cane, walker or w/c?: No Grab bars in the bathroom?: No Shower chair or bench in shower?: No Elevated toilet seat or a handicapped toilet?: No  TIMED UP AND GO:  Was the test performed?  No  Cognitive Function: 6CIT completed        08/20/2023   11:31 AM 08/14/2022   11:40 AM 08/09/2021    8:33 AM 08/01/2020   10:40 AM  6CIT Screen  What Year? 0 points 0 points 0 points 0 points  What month? 0 points 0 points 0 points 0 points  What time? 0 points 0 points 0 points 0 points  Count back from 20 0 points 0 points 0 points 0 points  Months in reverse 0 points 0 points 0 points 0 points  Repeat phrase 0 points 0 points 0 points 0 points  Total Score 0 points 0 points 0 points 0 points    Immunizations Immunization History  Administered Date(s) Administered   PFIZER(Purple  Top)SARS-COV-2 Vaccination 06/18/2019, 07/09/2019   PNEUMOCOCCAL CONJUGATE-20 03/01/2022    Screening Tests Health Maintenance  Topic Date Due   DTaP/Tdap/Td (1 - Tdap) Never done   Zoster Vaccines- Shingrix (1 of 2) Never done   COVID-19 Vaccine (3 - Pfizer risk series) 08/06/2019   FOOT EXAM  06/02/2023   HEMOGLOBIN A1C  11/13/2023   INFLUENZA VACCINE  11/22/2023   Lung Cancer Screening  12/11/2023   OPHTHALMOLOGY EXAM  02/14/2024   Diabetic kidney evaluation - eGFR measurement  05/15/2024   Diabetic kidney evaluation - Urine ACR  05/15/2024   MAMMOGRAM  06/24/2024   Medicare Annual Wellness (AWV)  08/19/2024   DEXA SCAN  10/26/2025   Colonoscopy  11/16/2029   Pneumonia Vaccine 92+ Years old  Completed   Hepatitis C Screening  Completed   HPV VACCINES  Aged Out   Meningococcal B Vaccine  Aged Out    Health Maintenance  Health Maintenance Due  Topic Date Due   DTaP/Tdap/Td (1 - Tdap) Never done  Zoster Vaccines- Shingrix (1 of 2) Never done   COVID-19 Vaccine (3 - Pfizer risk series) 08/06/2019   FOOT EXAM  06/02/2023   Health Maintenance Items Addressed: See Nurse Notes  Additional Screening:  Vision Screening: Recommended annual ophthalmology exams for early detection of glaucoma and other disorders of the eye.  Dental Screening: Recommended annual dental exams for proper oral hygiene  Community Resource Referral / Chronic Care Management: CRR required this visit?  No   CCM required this visit?  No     Plan:     I have personally reviewed and noted the following in the patient's chart:   Medical and social history Use of alcohol, tobacco or illicit drugs  Current medications and supplements including opioid prescriptions. Patient is not currently taking opioid prescriptions. Functional ability and status Nutritional status Physical activity Advanced directives List of other physicians Hospitalizations, surgeries, and ER visits in previous 12  months Vitals Screenings to include cognitive, depression, and falls Referrals and appointments  In addition, I have reviewed and discussed with patient certain preventive protocols, quality metrics, and best practice recommendations. A written personalized care plan for preventive services as well as general preventive health recommendations were provided to patient.     Jaunita Messier, CMA   08/20/2023   After Visit Summary: (MyChart) Due to this being a telephonic visit, the after visit summary with patients personalized plan was offered to patient via MyChart   Notes:  FBS this morning was 120 per patient Needs Tdap vaccine (pharmacy) Needs DM foot exam at next OV on 11/15/23 LDCT due ~12/11/23 (gave pt phone # to call and schedule) Declined DM & Nutrition education Declined covid and shingles vaccines

## 2023-08-28 ENCOUNTER — Other Ambulatory Visit: Payer: Self-pay | Admitting: Nurse Practitioner

## 2023-08-30 NOTE — Telephone Encounter (Signed)
 Requested Prescriptions  Pending Prescriptions Disp Refills   metFORMIN  (GLUCOPHAGE -XR) 500 MG 24 hr tablet [Pharmacy Med Name: metFORMIN  HCl ER 500 MG Oral Tablet Extended Release 24 Hour] 360 tablet 0    Sig: TAKE 2 TABLETS BY MOUTH WITH BREAKFAST AND 2 TABLETS AT BEDTIME     Endocrinology:  Diabetes - Biguanides Passed - 08/30/2023  1:31 PM      Passed - Cr in normal range and within 360 days    Creatinine, Ser  Date Value Ref Range Status  05/16/2023 0.63 0.57 - 1.00 mg/dL Final         Passed - HBA1C is between 0 and 7.9 and within 180 days    HB A1C (BAYER DCA - WAIVED)  Date Value Ref Range Status  05/16/2023 6.7 (H) 4.8 - 5.6 % Final    Comment:             Prediabetes: 5.7 - 6.4          Diabetes: >6.4          Glycemic control for adults with diabetes: <7.0          Passed - eGFR in normal range and within 360 days    GFR calc Af Amer  Date Value Ref Range Status  05/27/2017 >60 >60 mL/min Final    Comment:    (NOTE) The eGFR has been calculated using the CKD EPI equation. This calculation has not been validated in all clinical situations. eGFR's persistently <60 mL/min signify possible Chronic Kidney Disease.    GFR, Estimated  Date Value Ref Range Status  09/01/2021 >60 >60 mL/min Final    Comment:    (NOTE) Calculated using the CKD-EPI Creatinine Equation (2021)    eGFR  Date Value Ref Range Status  05/16/2023 95 >59 mL/min/1.73 Final         Passed - B12 Level in normal range and within 720 days    Vitamin B-12  Date Value Ref Range Status  01/31/2023 235 232 - 1,245 pg/mL Final         Passed - Valid encounter within last 6 months    Recent Outpatient Visits   None     Future Appointments             In 2 months Aileen Alexanders, NP [facility]   In 6 months Vaillancourt, [place], PA-C [facility] [place]            Passed - CBC within normal limits and completed in the last 12 months    WBC   Date Value Ref Range Status  05/16/2023 8.4 3.4 - 10.8 x10E3/uL Final  09/01/2021 10.1 4.0 - 10.5 K/uL Final   RBC  Date Value Ref Range Status  05/16/2023 4.30 3.77 - 5.28 x10E6/uL Final  09/01/2021 4.62 3.87 - 5.11 MIL/uL Final   Hemoglobin  Date Value Ref Range Status  05/16/2023 13.1 11.1 - 15.9 g/dL Final   Hematocrit  Date Value Ref Range Status  05/16/2023 39.7 34.0 - 46.6 % Final   MCHC  Date Value Ref Range Status  05/16/2023 33.0 31.5 - 35.7 g/dL Final  54/12/8117 14.7 30.0 - 36.0 g/dL Final   [facility]  Date Value Ref Range Status  05/16/2023 30.5 26.6 - 33.0 pg Final  09/01/2021 30.1 26.0 - 34.0 pg Final   MCV  Date Value Ref Range Status  05/16/2023 92 79 - 97 fL Final   No results found for: "  PLTCOUNTKUC", "LABPLAT", "POCPLA" RDW  Date Value Ref Range Status  05/16/2023 12.4 11.7 - 15.4 % Final

## 2023-09-03 ENCOUNTER — Telehealth: Payer: Self-pay

## 2023-09-03 NOTE — Telephone Encounter (Signed)
 Patient aware that his shipment for Ozempic  from [facility] has been received. She is aware that he should pick up in the next 2 weeks.

## 2023-09-10 ENCOUNTER — Other Ambulatory Visit: Payer: Self-pay | Admitting: Nurse Practitioner

## 2023-09-11 NOTE — Telephone Encounter (Signed)
 Refused metformin  500 mg because it was sent 08/30/2023 #360, 0 refills to [facility] #[id number].  Received at 1:32 PM on 08/30/2023.

## 2023-09-18 ENCOUNTER — Other Ambulatory Visit: Payer: Self-pay | Admitting: Cardiovascular Disease

## 2023-11-05 ENCOUNTER — Other Ambulatory Visit: Payer: Self-pay | Admitting: Nurse Practitioner

## 2023-11-06 NOTE — Telephone Encounter (Signed)
 Requested medication (s) are due for refill today: yes  Requested medication (s) are on the active medication list: yes  Last refill:  05/16/23 #30 1 RF  Future visit scheduled: no  Notes to clinic:  med not delegated to NT to RF   Requested Prescriptions  Pending Prescriptions Disp Refills   ALPRAZolam  (XANAX ) 0.25 MG tablet [Pharmacy Med Name: ALPRAZolam  0.25 MG Oral Tablet] 30 tablet 0    Sig: TAKE 1 TABLET BY MOUTH AS NEEDED FOR ANXIETY     Not Delegated - Psychiatry: Anxiolytics/Hypnotics 2 Failed - 11/06/2023  3:13 PM      Failed - This refill cannot be delegated      Failed - Urine Drug Screen completed in last 360 days      Passed - Patient is not pregnant      Passed - Valid encounter within last 6 months    Recent Outpatient Visits   None     Future Appointments             In 4 months Vaillancourt, Samantha, PA-C [facility] [place]

## 2023-11-07 ENCOUNTER — Other Ambulatory Visit: Payer: Self-pay | Admitting: Nurse Practitioner

## 2023-11-07 ENCOUNTER — Encounter: Payer: Self-pay | Admitting: Nurse Practitioner

## 2023-11-07 MED ORDER — ALPRAZOLAM 0.25 MG PO TABS: ORAL_TABLET | ORAL | 1 refills | Status: DC

## 2023-11-07 NOTE — Telephone Encounter (Signed)
 Please find out if patient actually needs this refill.  This was filled for her back in May.

## 2023-11-08 NOTE — Telephone Encounter (Signed)
 Requested medications are due for refill today.  no  Requested medications are on the active medications list.  yes  Last refill. 11/07/2023 #30 1 rf  Future visit scheduled.     Notes to clinic.  Pharmacy comment: Please clarify the directions  for this prescription. what is the allowed frequency?     Requested Prescriptions  Pending Prescriptions Disp Refills   ALPRAZolam  (XANAX ) 0.25 MG tablet [Pharmacy Med Name: ALPRAZolam  0.25MG    TAB] 30 tablet 1    Sig: TAKE 1 TABLET BY MOUTH AS NEEDED FOR ANXIETY     Not Delegated - Psychiatry: Anxiolytics/Hypnotics 2 Failed - 11/08/2023 12:43 PM      Failed - This refill cannot be delegated      Failed - Urine Drug Screen completed in last 360 days      Passed - Patient is not pregnant      Passed - Valid encounter within last 6 months    Recent Outpatient Visits   None     Future Appointments             In 4 months Vaillancourt, Samantha, PA-C [facility] [place]

## 2023-11-13 ENCOUNTER — Other Ambulatory Visit: Payer: Self-pay | Admitting: Nurse Practitioner

## 2023-11-15 ENCOUNTER — Ambulatory Visit: Payer: Self-pay | Admitting: Nurse Practitioner

## 2023-11-15 NOTE — Telephone Encounter (Signed)
 Requested medication (s) are due for refill today: yes  Requested medication (s) are on the active medication list: yes  Last refill:  11/07/23  Future visit scheduled: no  Notes to clinic:  Pharmacy comment: Please clarify the directions  for this prescription. need specific frequency.thanks!      Requested Prescriptions  Pending Prescriptions Disp Refills   ALPRAZolam  (XANAX ) 0.25 MG tablet [Pharmacy Med Name: ALPRAZolam  0.25MG    TAB] 30 tablet 1    Sig: TAKE 1 TABLET BY MOUTH AS NEEDED FOR ANXIETY     Not Delegated - Psychiatry: Anxiolytics/Hypnotics 2 Failed - 11/15/2023  9:24 AM      Failed - This refill cannot be delegated      Failed - Urine Drug Screen completed in last 360 days      Passed - Patient is not pregnant      Passed - Valid encounter within last 6 months    Recent Outpatient Visits   None     Future Appointments             In 2 months Arida, Deatrice LABOR, MD [facility] [place]   In 3 months Maurine Lukes, DEVONNA [facility]

## 2023-11-19 ENCOUNTER — Other Ambulatory Visit: Payer: Self-pay | Admitting: Medical Genetics

## 2023-11-26 ENCOUNTER — Ambulatory Visit (INDEPENDENT_AMBULATORY_CARE_PROVIDER_SITE_OTHER): Admitting: Nurse Practitioner

## 2023-11-26 ENCOUNTER — Encounter: Payer: Self-pay | Admitting: Nurse Practitioner

## 2023-11-26 VITALS — BP 128/82 | HR 61 | Wt 176.8 lb

## 2023-11-26 DIAGNOSIS — J439 Emphysema, unspecified: Secondary | ICD-10-CM

## 2023-11-26 DIAGNOSIS — E118 Type 2 diabetes mellitus with unspecified complications: Secondary | ICD-10-CM

## 2023-11-26 DIAGNOSIS — E1165 Type 2 diabetes mellitus with hyperglycemia: Secondary | ICD-10-CM

## 2023-11-26 DIAGNOSIS — F411 Generalized anxiety disorder: Secondary | ICD-10-CM | POA: Diagnosis not present

## 2023-11-26 DIAGNOSIS — E119 Type 2 diabetes mellitus without complications: Secondary | ICD-10-CM

## 2023-11-26 DIAGNOSIS — Z7985 Long-term (current) use of injectable non-insulin antidiabetic drugs: Secondary | ICD-10-CM | POA: Diagnosis not present

## 2023-11-26 DIAGNOSIS — I739 Peripheral vascular disease, unspecified: Secondary | ICD-10-CM

## 2023-11-26 DIAGNOSIS — I700 Atherosclerosis of aorta: Secondary | ICD-10-CM

## 2023-11-26 LAB — MICROSCOPIC EXAMINATION: Bacteria, UA: NONE SEEN

## 2023-11-26 LAB — URINALYSIS, ROUTINE W REFLEX MICROSCOPIC
Bilirubin, UA: NEGATIVE
Glucose, UA: NEGATIVE
Ketones, UA: NEGATIVE
Nitrite, UA: NEGATIVE
Specific Gravity, UA: 1.02 (ref 1.005–1.030)
Urobilinogen, Ur: 0.2 mg/dL (ref 0.2–1.0)
pH, UA: 6 (ref 5.0–7.5)

## 2023-11-26 NOTE — Assessment & Plan Note (Signed)
 Chronic.  Controlled.  CT Lung screening up to date.  Return to clinic in 6 months for reevaluation.  Call sooner if concerns arise. Smoking cessation since September 2022.  Encouraged patient to continue with smoking cessation.

## 2023-11-26 NOTE — Assessment & Plan Note (Signed)
 Chronic. Followed by Cardiology. Continue with Rosuvastatin daily and Zetia.  Labs ordered. Follow up in 6 months.  Call sooner if concerns arise.

## 2023-11-26 NOTE — Assessment & Plan Note (Signed)
 Chronic. Ongoing.  Rarely uses Xanax .  Not due for refills today.  Last refill was in July.  30 pills should last around 45 days.  Will refill before next appt, if needed.  Follow up in 6 months for reevaluation.  Call sooner if concerns arise.

## 2023-11-26 NOTE — Progress Notes (Signed)
 BP 128/82   Pulse 61   Wt 176 lb 12.8 oz (80.2 kg)   SpO2 97%   BMI 29.42 kg/m    Subjective:    Patient ID: Tammy Guerra, female    DOB: 1950-11-01, 73 y.o.   MRN: [id number]  HPI: Tammy Guerra is a 73 y.o. female  Chief Complaint  Patient presents with   Diabetes   Anxiety   Coronary Artery Disease   PAD   Emphysema   Patient states she has been feeling more tired and nauseous at times.  Symptoms have been going on for about a week.   DIABETES Hypoglycemic episodes:no Polydipsia/polyuria: no Visual disturbance: no Chest pain: no Paresthesias: no Glucose Monitoring: no  Accucheck frequency: sometimes  Fasting glucose: 140s  Post prandial:  Evening:  Before meals: Taking Insulin?: no  Long acting insulin:  Short acting insulin: Blood Pressure Monitoring: a few times a month Retinal Examination: Up to Date Foot Exam: Up to Date Diabetic Education: Not Completed Pneumovax: Not up to Date Influenza: Not up to Date Aspirin : no  COPD Doing well.  Denies concerns at visit today.   COPD status: controlled Satisfied with current treatment?: no Oxygen use: no Dyspnea frequency:  Cough frequency:  Rescue inhaler frequency:   Limitation of activity: no Productive cough:  Last Spirometry:  Pneumovax: Not up to Date Influenza: Not up to Date  ANXIETY Doing well.  Feels like it is tolerable.  Still has 1 refill of the xanax .  She is using trazodone  for sleep and it is working well for her she is trying not to take them every night.   Relevant past medical, surgical, family and social history reviewed and updated as indicated. Interim medical history since our last visit reviewed. Allergies and medications reviewed and updated.  Review of Systems  Eyes:  Negative for visual disturbance.  Respiratory:  Negative for cough, chest tightness and shortness of breath.   Cardiovascular:  Negative for chest pain, palpitations and leg swelling.  Endocrine:  Negative for polydipsia and polyuria.  Neurological:  Negative for dizziness, numbness and headaches.  Psychiatric/Behavioral:  Positive for sleep disturbance. The patient is nervous/anxious.     Per HPI unless specifically indicated above     Objective:    BP 128/82   Pulse 61   Wt 176 lb 12.8 oz (80.2 kg)   SpO2 97%   BMI 29.42 kg/m   Wt Readings from Last 3 Encounters:  11/26/23 176 lb 12.8 oz (80.2 kg)  08/20/23 170 lb (77.1 kg)  05/16/23 177 lb 3.2 oz (80.4 kg)    Physical Exam Vitals and nursing note reviewed.  Constitutional:      General: She is not in acute distress.    Appearance: Normal appearance. She is normal weight. She is not ill-appearing, toxic-appearing or diaphoretic.  HENT:     Head: Normocephalic.     Right Ear: External ear normal.     Left Ear: External ear normal.     Nose: Nose normal.     Mouth/Throat:     Mouth: Mucous membranes are moist.     Pharynx: Oropharynx is clear.  Eyes:     General:        Right eye: No discharge.        Left eye: No discharge.     Extraocular Movements: Extraocular movements intact.     Conjunctiva/sclera: Conjunctivae normal.     Pupils: Pupils are equal, round, and reactive to light.  Cardiovascular:     Rate and Rhythm: Normal rate and regular rhythm.     Heart sounds: No murmur heard. Pulmonary:     Effort: Pulmonary effort is normal. No respiratory distress.     Breath sounds: Normal breath sounds. No wheezing or rales.  Musculoskeletal:     Cervical back: Normal range of motion and neck supple.  Skin:    General: Skin is warm and dry.     Capillary Refill: Capillary refill takes less than 2 seconds.  Neurological:     General: No focal deficit present.     Mental Status: She is alert and oriented to person, place, and time. Mental status is at baseline.  Psychiatric:        Mood and Affect: Mood normal.        Behavior: Behavior normal.        Thought Content: Thought content normal.         Judgment: Judgment normal.     Results for orders placed or performed in visit on 06/25/23  HM MAMMOGRAPHY   Collection Time: 06/25/23 10:29 AM  Result Value Ref Range   HM Mammogram 0-4 Bi-Rad 0-4 Bi-Rad, Self Reported Normal      Assessment & Plan:   Problem List Items Addressed This Visit       Cardiovascular and Mediastinum   PAD (peripheral artery disease) (HCC)   Chronic. Followed by cardiology.  Continue to follow per their recommendations. Recommend continued smoking cessation.  Reviewed recent note.  Continue with Pletal .       Aortic atherosclerosis (HCC)   Chronic. Followed by Cardiology. Continue with Rosuvastatin  daily and Zetia .  Labs ordered. Follow up in 6 months.  Call sooner if concerns arise.        Respiratory   Emphysema lung (HCC)   Chronic.  Controlled.  CT Lung screening up to date.  Return to clinic in 6 months for reevaluation.  Call sooner if concerns arise. Smoking cessation since September 2022.  Encouraged patient to continue with smoking cessation.        Endocrine   Diabetes mellitus type 2 with complications (HCC)   Chronic.  Controlled.  Continue with current medication regimen.  On Ozempic  via PAP.  Doing well with Metformin  500mg  BID.  Last A1c was 6.7%. Continue with Ozempic  1mg .  Can increase dose if needed in the future.  Labs ordered today.  Return to clinic in 6 months for reevaluation.  Call sooner if concerns arise.       Relevant Orders   Lipid panel   Diabetes mellitus treated with injections of non-insulin medication (HCC)   Relevant Orders   Lipid panel   Urinalysis, Routine w reflex microscopic   RESOLVED: Uncontrolled type 2 diabetes mellitus with hyperglycemia (HCC) - Primary   Relevant Orders   Hemoglobin A1c   Comprehensive metabolic panel with GFR     Other   Generalized anxiety disorder (Chronic)   Chronic. Ongoing.  Rarely uses Xanax .  Not due for refills today.  Last refill was in July.  30 pills should last  around 45 days.  Will refill before next appt, if needed.  Follow up in 6 months for reevaluation.  Call sooner if concerns arise.          Follow up plan: No follow-ups on file.

## 2023-11-26 NOTE — Assessment & Plan Note (Signed)
 Chronic.  Controlled.  Continue with current medication regimen.  On Ozempic  via PAP.  Doing well with Metformin  500mg  BID.  Last A1c was 6.7%. Continue with Ozempic  1mg .  Can increase dose if needed in the future.  Labs ordered today.  Return to clinic in 6 months for reevaluation.  Call sooner if concerns arise.

## 2023-11-26 NOTE — Assessment & Plan Note (Signed)
 Chronic. Followed by cardiology.  Continue to follow per their recommendations. Recommend continued smoking cessation.  Reviewed recent note.  Continue with Pletal .

## 2023-11-27 ENCOUNTER — Ambulatory Visit: Payer: Self-pay | Admitting: Nurse Practitioner

## 2023-11-27 ENCOUNTER — Encounter: Payer: Self-pay | Admitting: Nurse Practitioner

## 2023-11-27 LAB — COMPREHENSIVE METABOLIC PANEL WITH GFR
ALT: 17 IU/L (ref 0–32)
AST: 14 IU/L (ref 0–40)
Albumin: 4.6 g/dL (ref 3.8–4.8)
Alkaline Phosphatase: 80 IU/L (ref 44–121)
BUN/Creatinine Ratio: 16 (ref 12–28)
BUN: 12 mg/dL (ref 8–27)
Bilirubin Total: 0.4 mg/dL (ref 0.0–1.2)
CO2: 24 mmol/L (ref 20–29)
Calcium: 9.9 mg/dL (ref 8.7–10.3)
Chloride: 98 mmol/L (ref 96–106)
Creatinine, Ser: 0.76 mg/dL (ref 0.57–1.00)
Globulin, Total: 2.1 g/dL (ref 1.5–4.5)
Glucose: 97 mg/dL (ref 70–99)
Potassium: 4 mmol/L (ref 3.5–5.2)
Sodium: 136 mmol/L (ref 134–144)
Total Protein: 6.7 g/dL (ref 6.0–8.5)
eGFR: 84 mL/min/1.73 (ref >59)

## 2023-11-27 LAB — LIPID PANEL
Chol/HDL Ratio: 2.2 ratio (ref 0.0–4.4)
Cholesterol, Total: 125 mg/dL (ref 100–199)
HDL: 58 mg/dL (ref >39)
LDL Chol Calc (NIH): 52 mg/dL (ref 0–99)
Triglycerides: 76 mg/dL (ref 0–149)
VLDL Cholesterol Cal: 15 mg/dL (ref 5–40)

## 2023-11-27 LAB — HEMOGLOBIN A1C
Est. average glucose Bld gHb Est-mCnc: 157 mg/dL
Hgb A1c MFr Bld: 7.1 % — ABNORMAL HIGH (ref 4.8–5.6)

## 2023-12-04 ENCOUNTER — Other Ambulatory Visit: Payer: Self-pay | Admitting: Nurse Practitioner

## 2023-12-06 NOTE — Telephone Encounter (Signed)
 Requested Prescriptions  Pending Prescriptions Disp Refills   metFORMIN  (GLUCOPHAGE -XR) 500 MG 24 hr tablet [Pharmacy Med Name: metFORMIN  HCl ER 500 MG Oral Tablet Extended Release 24 Hour] 360 tablet 1    Sig: TAKE 2 TABLETS BY MOUTH WITH BREAKFAST AND 2 TABLETS AT BEDTIME     Endocrinology:  Diabetes - Biguanides Passed - 12/06/2023  1:33 PM      Passed - Cr in normal range and within 360 days    Creatinine, Ser  Date Value Ref Range Status  11/26/2023 0.76 0.57 - 1.00 mg/dL Final         Passed - HBA1C is between 0 and 7.9 and within 180 days    HB A1C (BAYER DCA - WAIVED)  Date Value Ref Range Status  05/16/2023 6.7 (H) 4.8 - 5.6 % Final    Comment:             Prediabetes: 5.7 - 6.4          Diabetes: >6.4          Glycemic control for adults with diabetes: <7.0    Hgb A1c MFr Bld  Date Value Ref Range Status  11/26/2023 7.1 (H) 4.8 - 5.6 % Final    Comment:             Prediabetes: 5.7 - 6.4          Diabetes: >6.4          Glycemic control for adults with diabetes: <7.0          Passed - eGFR in normal range and within 360 days    GFR calc Af Amer  Date Value Ref Range Status  05/27/2017 >60 >60 mL/min Final    Comment:    (NOTE) The eGFR has been calculated using the CKD EPI equation. This calculation has not been validated in all clinical situations. eGFR's persistently <60 mL/min signify possible Chronic Kidney Disease.    GFR, Estimated  Date Value Ref Range Status  09/01/2021 >60 >60 mL/min Final    Comment:    (NOTE) Calculated using the CKD-EPI Creatinine Equation (2021)    eGFR  Date Value Ref Range Status  11/26/2023 84 >59 mL/min/1.73 Final         Passed - B12 Level in normal range and within 720 days    Vitamin B-12  Date Value Ref Range Status  01/31/2023 235 232 - 1,245 pg/mL Final         Passed - Valid encounter within last 6 months    Recent Outpatient Visits           1 week ago Uncontrolled type 2 diabetes mellitus with  hyperglycemia (HCC)   [place] [facility] Melvin Pao, NP       Future Appointments             In 1 month Darron, Deatrice LABOR, MD  [facility] [place]   In 3 months Vaillancourt, Lucie, PA-C [facility] [place]            Passed - CBC within normal limits and completed in the last 12 months    WBC  Date Value Ref Range Status  05/16/2023 8.4 3.4 - 10.8 x10E3/uL Final  09/01/2021 10.1 4.0 - 10.5 K/uL Final   RBC  Date Value Ref Range Status  05/16/2023 4.30 3.77 - 5.28 x10E6/uL Final  09/01/2021 4.62 3.87 - 5.11 MIL/uL Final   Hemoglobin  Date Value Ref Range  Status  05/16/2023 13.1 11.1 - 15.9 g/dL Final   Hematocrit  Date Value Ref Range Status  05/16/2023 39.7 34.0 - 46.6 % Final   MCHC  Date Value Ref Range Status  05/16/2023 33.0 31.5 - 35.7 g/dL Final  94/87/7976 66.4 30.0 - 36.0 g/dL Final   [place]  Date Value Ref Range Status  05/16/2023 30.5 26.6 - 33.0 pg Final  09/01/2021 30.1 26.0 - 34.0 pg Final   MCV  Date Value Ref Range Status  05/16/2023 92 79 - 97 fL Final   No results found for: PLTCOUNTKUC, LABPLAT, POCPLA RDW  Date Value Ref Range Status  05/16/2023 12.4 11.7 - 15.4 % Final

## 2023-12-19 ENCOUNTER — Ambulatory Visit: Payer: Self-pay

## 2023-12-19 NOTE — Telephone Encounter (Signed)
 FYI Only or Action Required?: Action required by provider: please review, vv scheduled per request.  Patient was last seen in primary care on 11/26/2023 by Melvin Pao, NP.  Called Nurse Triage reporting UTI sx.  Symptoms began several days ago.  Interventions attempted: Rest, hydration, or home remedies.  Symptoms are: unchanged.  Triage Disposition: See HCP Within 4 Hours (Or PCP Triage)  Patient/caregiver understands and will follow disposition?: Yes, but will wait  Copied from CRM #[id number]. Topic: Clinical - Red Word Triage >> Dec 19, 2023 10:37 AM Mia F wrote: Red Word that prompted transfer to Nurse Triage: Frequent urination, pain in lower back, pressure like pain at the end of urinating. She does have kidney stones but so far she does not have blood in urine as she has had before with kidney stones. Started 2 days ago. Reason for Disposition  Side (flank) or lower back pain present  Answer Assessment - Initial Assessment Questions Additional info: Patient requesting antibiotic, aware visit is required, requests virtual visit due to being out of the region, still in state, she is returning home on Saturday. Virtual visit scheduled with pcp per request, patient is aware and understands pcp may have other recommendation.     1. SEVERITY: How bad is the pain?  (e.g., Scale 1-10; mild, moderate, or severe)     Moderate  2. FREQUENCY: How many times have you had painful urination today?      Increased  3. PATTERN: Is pain present every time you urinate or just sometimes?      Each 4. ONSET: When did the painful urination start?      3 days ago 5. FEVER: Do you have a fever? If Yes, ask: What is your temperature, how was it measured, and when did it start?     Denies  6. PAST UTI: Have you had a urine infection before? If Yes, ask: When was the last time? and What happened that time?      yes 7. CAUSE: What do you think is causing the painful  urination?  (e.g., UTI, scratch, Herpes sore)     Urinary sx  8. OTHER SYMPTOMS: Do you have any other symptoms? (e.g., blood in urine, flank pain, genital sores, urgency, vaginal discharge)     Bilateral flank pain, bladder pressure.  Protocols used: Urination Pain - Female-A-AH

## 2023-12-20 ENCOUNTER — Telehealth: Admitting: Nurse Practitioner

## 2023-12-20 ENCOUNTER — Encounter: Payer: Self-pay | Admitting: Nurse Practitioner

## 2023-12-20 DIAGNOSIS — N3000 Acute cystitis without hematuria: Secondary | ICD-10-CM | POA: Diagnosis not present

## 2023-12-20 MED ORDER — NITROFURANTOIN MONOHYD MACRO 100 MG PO CAPS: 100.0000 mg | ORAL_CAPSULE | Freq: Two times a day (BID) | ORAL | 0 refills | Status: DC

## 2023-12-20 NOTE — Progress Notes (Signed)
 There were no vitals taken for this visit.   Subjective:    Patient ID: Tammy Guerra, female    DOB: [birth year], 73 y.o.   MRN: [id number]  HPI: Tammy Guerra is a 73 y.o. female  Chief Complaint  Patient presents with   Urinary Tract Infection    Patient states she has been having been having lower back pain for the last few days. States she has been having pain an pressure with urination that started yesterday. States she has not seen any blood yet but states it feels just like it did last year around this time.    URINARY SYMPTOMS Symptoms started Monday night or Tuesday morning Dysuria: yes- end of her stream Urinary frequency: yes Urgency: yes Small volume voids: yes Symptom severity: no Urinary incontinence: no Foul odor: yes Hematuria: no Abdominal pain: no Back pain: yes Suprapubic pain/pressure: yes Flank pain: no Fever:  no Vomiting: no Relief with cranberry juice: no Relief with pyridium: no Status: better/worse/stable Previous urinary tract infection: no Treatments attempted: increasing fluids   Relevant past medical, surgical, family and social history reviewed and updated as indicated. Interim medical history since our last visit reviewed. Allergies and medications reviewed and updated.  Review of Systems  Constitutional:  Negative for fever.  Gastrointestinal:  Positive for abdominal pain. Negative for vomiting.  Genitourinary:  Positive for decreased urine volume, dysuria, frequency and urgency. Negative for flank pain and hematuria.  Musculoskeletal:  Positive for back pain.    Per HPI unless specifically indicated above     Objective:    There were no vitals taken for this visit.  Wt Readings from Last 3 Encounters:  11/26/23 176 lb 12.8 oz (80.2 kg)  08/20/23 170 lb (77.1 kg)  05/16/23 177 lb 3.2 oz (80.4 kg)    Physical Exam Vitals and nursing note reviewed.  HENT:     Head: Normocephalic.     Right Ear: Hearing normal.      Left Ear: Hearing normal.     Nose: Nose normal.  Eyes:     Pupils: Pupils are equal, round, and reactive to light.  Pulmonary:     Effort: Pulmonary effort is normal. No respiratory distress.  Neurological:     Mental Status: She is alert.  Psychiatric:        Mood and Affect: Mood normal.        Behavior: Behavior normal.        Thought Content: Thought content normal.        Judgment: Judgment normal.     Results for orders placed or performed in visit on 11/26/23  Microscopic Examination   Collection Time: 11/26/23 11:41 AM   Urine  Result Value Ref Range   WBC, UA 0-5 0 - 5 /hpf   RBC, Urine 3-10 (A) 0 - 2 /hpf   Epithelial Cells (non renal) 0-10 0 - 10 /hpf   Mucus, UA Present (A) Not Estab.   Bacteria, UA None seen None seen/Few  Urinalysis, Routine w reflex microscopic   Collection Time: 11/26/23 11:41 AM  Result Value Ref Range   Specific Gravity, UA 1.020 1.005 - 1.030   pH, UA 6.0 5.0 - 7.5   Color, UA Yellow Yellow   Appearance Ur Clear Clear   Leukocytes,UA Trace (A) Negative   Protein,UA Trace (A) Negative/Trace   Glucose, UA Negative Negative   Ketones, UA Negative Negative   RBC, UA Trace (A) Negative   Bilirubin, UA Negative Negative  Urobilinogen, Ur 0.2 0.2 - 1.0 mg/dL   Nitrite, UA Negative Negative   Microscopic Examination See below:   Lipid panel   Collection Time: 11/26/23 11:44 AM  Result Value Ref Range   Cholesterol, Total 125 100 - 199 mg/dL   Triglycerides 76 0 - 149 mg/dL   HDL 58 >60 mg/dL   VLDL Cholesterol Cal 15 5 - 40 mg/dL   LDL Chol Calc (NIH) 52 0 - 99 mg/dL   Chol/HDL Ratio 2.2 0.0 - 4.4 ratio  Hemoglobin A1c   Collection Time: 11/26/23 11:44 AM  Result Value Ref Range   Hgb A1c MFr Bld 7.1 (H) 4.8 - 5.6 %   Est. average glucose Bld gHb Est-mCnc 157 mg/dL  Comprehensive metabolic panel with GFR   Collection Time: 11/26/23 11:44 AM  Result Value Ref Range   Glucose 97 70 - 99 mg/dL   BUN 12 8 - 27 mg/dL   Creatinine,  Ser 9.23 0.57 - 1.00 mg/dL   eGFR 84 >40 fO/fpw/8.26   BUN/Creatinine Ratio 16 12 - 28   Sodium 136 134 - 144 mmol/L   Potassium 4.0 3.5 - 5.2 mmol/L   Chloride 98 96 - 106 mmol/L   CO2 24 20 - 29 mmol/L   Calcium  9.9 8.7 - 10.3 mg/dL   Total Protein 6.7 6.0 - 8.5 g/dL   Albumin 4.6 3.8 - 4.8 g/dL   Globulin, Total 2.1 1.5 - 4.5 g/dL   Bilirubin Total 0.4 0.0 - 1.2 mg/dL   Alkaline Phosphatase 80 44 - 121 IU/L   AST 14 0 - 40 IU/L   ALT 17 0 - 32 IU/L      Assessment & Plan:   Problem List Items Addressed This Visit   None Visit Diagnoses       Acute cystitis without hematuria    -  Primary   Not able to obtain urine sample. Will treat wiht Macrobid . Complete course of medicaiton. If not improved next week, needs in person visit.        Follow up plan: No follow-ups on file.  This visit was completed via MyChart due to the restrictions of the COVID-19 pandemic. All issues as above were discussed and addressed. Physical exam was done as above through visual confirmation on MyChart. If it was felt that the patient should be evaluated in the office, they were directed there. The patient verbally consented to this visit. Location of the patient: Home Location of the provider: Office Those involved with this call:  Provider: Darice Petty, NP CMA: Laymon Metro, CMA Front Desk/Registration: Claretta Maiden This encounter was conducted via video.  I spent 20 minutes dedicated to the care of this patient on the date of this encounter to include previsit review of plan of care, follow up and medications, face to face time with the patient, and post visit ordering of testing.

## 2023-12-24 ENCOUNTER — Telehealth: Payer: Self-pay
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
## 2023-12-30 ENCOUNTER — Other Ambulatory Visit: Payer: Self-pay | Admitting: *Deleted

## 2023-12-30 ENCOUNTER — Other Ambulatory Visit: Payer: Self-pay | Admitting: Acute Care

## 2023-12-30 DIAGNOSIS — Z122 Encounter for screening for malignant neoplasm of respiratory organs: Secondary | ICD-10-CM

## 2023-12-30 DIAGNOSIS — N200 Calculus of kidney: Secondary | ICD-10-CM

## 2023-12-30 DIAGNOSIS — Z87891 Personal history of nicotine dependence: Secondary | ICD-10-CM

## 2023-12-31 ENCOUNTER — Ambulatory Visit (INDEPENDENT_AMBULATORY_CARE_PROVIDER_SITE_OTHER): Admitting: Nurse Practitioner

## 2023-12-31 ENCOUNTER — Encounter: Payer: Self-pay | Admitting: Nurse Practitioner

## 2023-12-31 ENCOUNTER — Ambulatory Visit: Admitting: Nurse Practitioner

## 2023-12-31 VITALS — BP 126/70 | HR 74 | Temp 98.9°F | Ht 65.0 in | Wt 179.0 lb

## 2023-12-31 DIAGNOSIS — R350 Frequency of micturition: Secondary | ICD-10-CM

## 2023-12-31 DIAGNOSIS — L259 Unspecified contact dermatitis, unspecified cause: Secondary | ICD-10-CM | POA: Diagnosis not present

## 2023-12-31 DIAGNOSIS — N3000 Acute cystitis without hematuria: Secondary | ICD-10-CM | POA: Diagnosis not present

## 2023-12-31 LAB — MICROSCOPIC EXAMINATION

## 2023-12-31 LAB — URINALYSIS, ROUTINE W REFLEX MICROSCOPIC
Bilirubin, UA: NEGATIVE
Glucose, UA: NEGATIVE
Ketones, UA: NEGATIVE
Nitrite, UA: POSITIVE — AB
Specific Gravity, UA: 1.025 (ref 1.005–1.030)
Urobilinogen, Ur: 0.2 mg/dL (ref 0.2–1.0)
pH, UA: 5.5 (ref 5.0–7.5)

## 2023-12-31 LAB — WET PREP FOR TRICH, YEAST, CLUE
Clue Cell Exam: NEGATIVE
Trichomonas Exam: NEGATIVE
Yeast Exam: NEGATIVE

## 2023-12-31 MED ORDER — CIPROFLOXACIN HCL 500 MG PO TABS: 500.0000 mg | ORAL_TABLET | Freq: Two times a day (BID) | ORAL | 0 refills | Status: AC

## 2023-12-31 MED ORDER — FLUCONAZOLE 150 MG PO TABS: 150.0000 mg | ORAL_TABLET | Freq: Once | ORAL | 0 refills | Status: AC

## 2023-12-31 MED ORDER — TRIAMCINOLONE ACETONIDE 0.1 % EX CREA: 1.0000 | TOPICAL_CREAM | Freq: Two times a day (BID) | CUTANEOUS | 0 refills | Status: AC

## 2023-12-31 NOTE — Progress Notes (Addendum)
 BP 126/70   Pulse 74   Temp 98.9 F (37.2 C) (Oral)   Ht 5' 5 (1.651 m)   Wt 81.2 kg   SpO2 94%   BMI 29.79 kg/m    Subjective:    Patient ID: Tammy Guerra, female    DOB: [birth year], 73 y.o.   MRN: [id number]  NOTE WRITTEN BY DNP STUDENT.  ASSESSMENT AND PLAN OF CARE REVIEWED WITH STUDENT, AGREE WITH ABOVE FINDINGS AND PLAN.   Chief Complaint  Patient presents with   Ankle Pain    Patient states she has been noticing some swelling and discoloration to her R ankle. States this has been going on for a while.    Urinary Tract Infection   HPI: Tammy Guerra is a 73 y.o. female presents today for dysuria, frequency and pelvic pain. Pt recently completed macrobid  with mild improvement, but states symptoms are persisting. Pt endorsed back pain this morning and reports a hx of kidney stones, states she has a follow-up with urology tomorrow. Pt denies N/V, abdominal pain, or fevers. Pt also reports itching on R ankle, states she has been scratching the area.   URINARY SYMPTOMS  Dysuria: no Urinary frequency: yes Urgency: yes Small volume voids: no Symptom severity: yes Urinary incontinence: no Foul odor: no Hematuria: no Abdominal pain: no Back pain: yes Suprapubic pain/pressure: no Flank pain: yes Fever:  no Vomiting: no Relief with cranberry juice: no Relief with pyridium: no Status: better/worse/stable Previous urinary tract infection: yes Recurrent urinary tract infection: no Sexual activity: No sexually active/monogomous/practicing safe sex History of sexually transmitted disease: no Penile discharge: no Treatments attempted: antibiotics, cranberry, and increasing fluids   Relevant past medical, surgical, family and social history reviewed and updated as indicated. Interim medical history since our last visit reviewed. Allergies and medications reviewed and updated.  Review of Systems  Constitutional:  Negative for chills and fever.  Respiratory:   Negative for shortness of breath.   Cardiovascular:  Negative for chest pain.  Gastrointestinal:  Negative for abdominal pain, nausea and vomiting.  Genitourinary:  Positive for dysuria, flank pain, frequency, pelvic pain and urgency.    Per HPI unless specifically indicated above     Objective:    BP 126/70   Pulse 74   Temp 98.9 F (37.2 C) (Oral)   Ht 5' 5 (1.651 m)   Wt 81.2 kg   SpO2 94%   BMI 29.79 kg/m   Wt Readings from Last 3 Encounters:  12/31/23 81.2 kg  11/26/23 80.2 kg  08/20/23 77.1 kg    Physical Exam Constitutional:      Appearance: Normal appearance.  Cardiovascular:     Rate and Rhythm: Normal rate and regular rhythm.     Pulses: Normal pulses.     Heart sounds: Normal heart sounds.  Pulmonary:     Effort: Pulmonary effort is normal.     Breath sounds: Normal breath sounds.  Abdominal:     Tenderness: There is no abdominal tenderness. There is no guarding.  Genitourinary:    Comments: No CVA tenderness Skin:    General: Skin is warm and dry.     Comments: Redness to right ankle  Neurological:     Mental Status: She is alert.     Results for orders placed or performed in visit on 11/26/23  Microscopic Examination   Collection Time: 11/26/23 11:41 AM   Urine  Result Value Ref Range   WBC, UA 0-5 0 - 5 /hpf  RBC, Urine 3-10 (A) 0 - 2 /hpf   Epithelial Cells (non renal) 0-10 0 - 10 /hpf   Mucus, UA Present (A) Not Estab.   Bacteria, UA None seen None seen/Few  Urinalysis, Routine w reflex microscopic   Collection Time: 11/26/23 11:41 AM  Result Value Ref Range   Specific Gravity, UA 1.020 1.005 - 1.030   pH, UA 6.0 5.0 - 7.5   Color, UA Yellow Yellow   Appearance Ur Clear Clear   Leukocytes,UA Trace (A) Negative   Protein,UA Trace (A) Negative/Trace   Glucose, UA Negative Negative   Ketones, UA Negative Negative   RBC, UA Trace (A) Negative   Bilirubin, UA Negative Negative   Urobilinogen, Ur 0.2 0.2 - 1.0 mg/dL   Nitrite, UA  Negative Negative   Microscopic Examination See below:   Lipid panel   Collection Time: 11/26/23 11:44 AM  Result Value Ref Range   Cholesterol, Total 125 100 - 199 mg/dL   Triglycerides 76 0 - 149 mg/dL   HDL 58 >60 mg/dL   VLDL Cholesterol Cal 15 5 - 40 mg/dL   LDL Chol Calc (NIH) 52 0 - 99 mg/dL   Chol/HDL Ratio 2.2 0.0 - 4.4 ratio  Hemoglobin A1c   Collection Time: 11/26/23 11:44 AM  Result Value Ref Range   Hgb A1c MFr Bld 7.1 (H) 4.8 - 5.6 %   Est. average glucose Bld gHb Est-mCnc 157 mg/dL  Comprehensive metabolic panel with GFR   Collection Time: 11/26/23 11:44 AM  Result Value Ref Range   Glucose 97 70 - 99 mg/dL   BUN 12 8 - 27 mg/dL   Creatinine, Ser 9.23 0.57 - 1.00 mg/dL   eGFR 84 >40 fO/fpw/8.26   BUN/Creatinine Ratio 16 12 - 28   Sodium 136 134 - 144 mmol/L   Potassium 4.0 3.5 - 5.2 mmol/L   Chloride 98 96 - 106 mmol/L   CO2 24 20 - 29 mmol/L   Calcium  9.9 8.7 - 10.3 mg/dL   Total Protein 6.7 6.0 - 8.5 g/dL   Albumin 4.6 3.8 - 4.8 g/dL   Globulin, Total 2.1 1.5 - 4.5 g/dL   Bilirubin Total 0.4 0.0 - 1.2 mg/dL   Alkaline Phosphatase 80 44 - 121 IU/L   AST 14 0 - 40 IU/L   ALT 17 0 - 32 IU/L      Assessment & Plan:   Problem List Items Addressed This Visit   None Visit Diagnoses       Acute cystitis without hematuria    -  Primary   UA + for blood, nitrates and bacteria. Take cipro  as prescribed. Follow-up with urology tomorrow.   Relevant Orders   Urinalysis, Routine w reflex microscopic   Urine Culture     Frequent urination       UA + for blood nitrates and bacteria. Negative for yeast, hx of yeast infections with antibiotics. Take cipro  and diflucan  as prescribed. Follow-up as needed.   Relevant Orders   WET PREP FOR TRICH, YEAST, CLUE     Contact dermatitis, unspecified contact dermatitis type, unspecified trigger       Itching and redness to right ankle. Sent steroid cream to pharmacy. Follow-up as needed.        Follow up plan: No  follow-ups on file.

## 2024-01-01 ENCOUNTER — Ambulatory Visit
Admission: RE | Admit: 2024-01-01 | Discharge: 2024-01-01 | Disposition: A | Discharge status: Home / Self Care | Attending: Urology | Admitting: Urology

## 2024-01-01 ENCOUNTER — Ambulatory Visit: Payer: Self-pay | Admitting: Nurse Practitioner

## 2024-01-01 ENCOUNTER — Ambulatory Visit
Admission: RE | Admit: 2024-01-01 | Discharge: 2024-01-01 | Disposition: A | Discharge status: Home / Self Care | Source: Ambulatory Visit | Attending: Urology | Admitting: Urology

## 2024-01-01 ENCOUNTER — Ambulatory Visit: Admitting: Urology

## 2024-01-01 ENCOUNTER — Encounter: Payer: Self-pay | Admitting: Urology

## 2024-01-01 VITALS — BP 109/70 | HR 86 | Ht 65.0 in | Wt 178.0 lb

## 2024-01-01 DIAGNOSIS — N200 Calculus of kidney: Secondary | ICD-10-CM | POA: Diagnosis not present

## 2024-01-01 DIAGNOSIS — N3000 Acute cystitis without hematuria: Secondary | ICD-10-CM

## 2024-01-01 DIAGNOSIS — I878 Other specified disorders of veins: Secondary | ICD-10-CM | POA: Diagnosis not present

## 2024-01-01 LAB — URINALYSIS, COMPLETE
Bilirubin, UA: NEGATIVE
Glucose, UA: NEGATIVE
Ketones, UA: NEGATIVE
Nitrite, UA: POSITIVE — AB
Protein,UA: NEGATIVE
Specific Gravity, UA: 1.025 (ref 1.005–1.030)
Urobilinogen, Ur: 0.2 mg/dL (ref 0.2–1.0)
pH, UA: 6 (ref 5.0–7.5)

## 2024-01-01 LAB — MICROSCOPIC EXAMINATION: WBC, UA: >30 /HPF — AB (ref 0–5)
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
## 2024-01-02 ENCOUNTER — Encounter: Payer: Self-pay | Admitting: Urology

## 2024-01-02 LAB — URINE CULTURE

## 2024-01-14 ENCOUNTER — Ambulatory Visit
Admission: RE | Admit: 2024-01-14 | Discharge: 2024-01-14 | Disposition: A | Discharge status: Home / Self Care | Source: Ambulatory Visit | Attending: Acute Care | Admitting: Acute Care

## 2024-01-14 DIAGNOSIS — Z122 Encounter for screening for malignant neoplasm of respiratory organs: Secondary | ICD-10-CM | POA: Diagnosis not present

## 2024-01-14 DIAGNOSIS — Z87891 Personal history of nicotine dependence: Secondary | ICD-10-CM | POA: Insufficient documentation

## 2024-01-17 ENCOUNTER — Other Ambulatory Visit: Payer: Self-pay | Admitting: Nurse Practitioner

## 2024-01-17 DIAGNOSIS — E1165 Type 2 diabetes mellitus with hyperglycemia: Secondary | ICD-10-CM

## 2024-01-20 NOTE — Telephone Encounter (Signed)
 Requested Prescriptions  Pending Prescriptions Disp Refills   Continuous Glucose Sensor (FREESTYLE LIBRE 3 PLUS SENSOR) MISC [Pharmacy Med Name: FREESTYLE LIBRE 3+ SEN 15D KIT] 12 each 0    Sig: CHANGE SENSOR EVERY 15 DAYS     There is no refill protocol information for this order     lisinopril  (ZESTRIL ) 2.5 MG tablet [Pharmacy Med Name: Lisinopril  2.5 MG Oral Tablet] 90 tablet 0    Sig: Take 1 tablet by mouth once daily     Cardiovascular:  ACE Inhibitors Passed - 01/20/2024  2:48 PM      Passed - Cr in normal range and within 180 days    Creatinine, Ser  Date Value Ref Range Status  11/26/2023 0.76 0.57 - 1.00 mg/dL Final         Passed - K in normal range and within 180 days    Potassium  Date Value Ref Range Status  11/26/2023 4.0 3.5 - 5.2 mmol/L Final         Passed - Patient is not pregnant      Passed - Last BP in normal range    BP Readings from Last 1 Encounters:  01/01/24 109/70         Passed - Valid encounter within last 6 months    Recent Outpatient Visits           2 weeks ago Acute cystitis without hematuria   [facility] Melvin Pao, NP   1 month ago Acute cystitis without hematuria   [facility] Melvin Pao, NP   1 month ago Uncontrolled type 2 diabetes mellitus with hyperglycemia [facility])   [facility] Melvin Pao, NP       Future Appointments             In 3 days Darron, Deatrice LABOR, MD [facility] [place]   In 1 month Maurine Lukes, Ophthalmology [facility]

## 2024-01-21 ENCOUNTER — Encounter: Payer: Self-pay | Admitting: Cardiovascular Disease

## 2024-01-22 ENCOUNTER — Other Ambulatory Visit: Payer: Self-pay

## 2024-01-22 DIAGNOSIS — Z87891 Personal history of nicotine dependence: Secondary | ICD-10-CM

## 2024-01-22 DIAGNOSIS — Z122 Encounter for screening for malignant neoplasm of respiratory organs: Secondary | ICD-10-CM

## 2024-01-23 ENCOUNTER — Ambulatory Visit: Attending: Cardiovascular Disease | Admitting: Cardiovascular Disease

## 2024-01-23 ENCOUNTER — Encounter: Payer: Self-pay | Admitting: Cardiovascular Disease

## 2024-01-23 VITALS — BP 108/64 | HR 69 | Ht 65.0 in | Wt 180.1 lb

## 2024-01-23 DIAGNOSIS — E785 Hyperlipidemia, unspecified: Secondary | ICD-10-CM | POA: Diagnosis not present

## 2024-01-23 DIAGNOSIS — I2510 Atherosclerotic heart disease of native coronary artery without angina pectoris: Secondary | ICD-10-CM

## 2024-01-23 DIAGNOSIS — I739 Peripheral vascular disease, unspecified: Secondary | ICD-10-CM | POA: Diagnosis not present

## 2024-01-23 NOTE — Patient Instructions (Signed)

## 2024-01-23 NOTE — Progress Notes (Signed)
 Cardiology Office Note   Date:  01/23/2024   ID:  Tammy Guerra, DOB Aug 21, 1950, MRN [id number]  PCP:  Melvin Pao, NP  Cardiologist:  Dr. Darron  Chief Complaint  Patient presents with   Follow-up    OD 12 month f/u c/o fatigue and chest discomfort. Meds reviewed verbally with pt.       History of Present Illness: Tammy Guerra is a 73 y.o. female who presents for a follow-up visit regarding peripheral arterial disease and coronary artery disease.   She has past medical history of pancreatic cyst, anxiety, depression, hyperlipidemia, diabetes mellitus, tobacco use and aortic and coronary calcifications. She has known history of peripheral arterial disease with bilateral calf claudication. Noninvasive vascular studies in 2022 showed an ABI of 0.92 on the right and 0.63 on the left.  Duplex showed severe stenosis of the right popliteal artery with chronically occluded left SFA throughout its whole length She was started on cilostazol  and she quit smoking.  She had subsequent improvement in symptoms. She had atypical chest pain and shortness of breath and had an abnormal cardiac CTA in May of 2023.  Thus, coronary angiography was performed in May, 2023 which showed mild one-vessel coronary artery disease involving the LAD which was moderately calcified.  EF was normal with mildly elevated left ventricular end-diastolic pressure.  I recommended medical therapy.  She has been doing reasonably well with no recent shortness of breath.  She describes rare episodes of random chest pain that is not exertional.  She complains of fatigue and some mild swelling in the right foot.  She has not been active and does not do any form of exercise.  She has no significant calf claudication.  Past Medical History:  Diagnosis Date   Anxiety    Arthritis    B12 deficiency anemia 11/13/2022   Cancer (HCC)    skin/CERVICAL CA   Complication of anesthesia    discomfort during first cataract    Diabetes mellitus without complication (HCC)    Emphysema lung (HCC) 07/01/2020   Emphysema lung (HCC) 07/01/2020   GERD (gastroesophageal reflux disease)    NO MEDS   History of kidney stones    STONES AND CYSTS   Kidney stone    Palpitations    Pancreatic cyst     Past Surgical History:  Procedure Laterality Date   ABDOMINAL HYSTERECTOMY  2004   CARDIAC CATHETERIZATION     CATARACT EXTRACTION W/PHACO Left 09/07/2014   Procedure: CATARACT EXTRACTION PHACO AND INTRAOCULAR LENS PLACEMENT (IOC);  Surgeon: Elsie Carmine, MD;  Location: [facility];  Service: Ophthalmology;  Laterality: Left;  US  01:03 AP% 27.7 CDE 17.55   CORONARY ANGIOPLASTY     EYE SURGERY     cataract   KNEE ARTHROSCOPY WITH MEDIAL MENISECTOMY Right 05/30/2017   Procedure: KNEE ARTHROSCOPY WITH MEDIAL AND LATERAL  MENISECTOMY;  Surgeon: Kathlynn Sharper, MD;  Location: [facility];  Service: Orthopedics;  Laterality: Right;   LEFT HEART CATH AND CORONARY ANGIOGRAPHY N/A 09/08/2021   Procedure: LEFT HEART CATH AND CORONARY ANGIOGRAPHY;  Surgeon: Darron Deatrice LABOR, MD;  Location: [facility] INV[facility];  Service: Cardiovascular;  Laterality: N/A;   SYNOVECTOMY Right 05/30/2017   Procedure: SYNOVECTOMY;  Surgeon: Kathlynn Sharper, MD;  Location: [facility];  Service: Orthopedics;  Laterality: Right;   TOTAL ABDOMINAL HYSTERECTOMY W/ BILATERAL SALPINGOOPHORECTOMY       Current Outpatient Medications  Medication Sig Dispense Refill   ALPRAZolam  (XANAX ) 0.25 MG tablet Take 1  tablet (0.25 mg total) by mouth 2 (two) times daily as needed for anxiety. 30 tablet 1   aspirin  EC 81 MG tablet Take 81 mg by mouth daily. Swallow whole.     cilostazol  (PLETAL ) 100 MG tablet Take 1 tablet by mouth twice daily 180 tablet 3   Continuous Glucose Sensor (FREESTYLE LIBRE 3 PLUS SENSOR) MISC CHANGE SENSOR EVERY 15 DAYS 12 each 0   ezetimibe  (ZETIA ) 10 MG tablet Take 1 tablet by mouth once daily 90 tablet 3   lisinopril  (ZESTRIL ) 2.5 MG tablet  Take 1 tablet by mouth once daily 90 tablet 0   metFORMIN  (GLUCOPHAGE -XR) 500 MG 24 hr tablet TAKE 2 TABLETS BY MOUTH WITH BREAKFAST AND 2 TABLETS AT BEDTIME 360 tablet 1   rosuvastatin  (CRESTOR ) 20 MG tablet Take 1 tablet by mouth once daily 90 tablet 2   Semaglutide , 1 MG/DOSE, (OZEMPIC , 1 MG/DOSE,) 4 MG/3ML SOPN Inject 1 mg into the skin once a week. 3 mL 0   traZODone  (DESYREL ) 50 MG tablet TAKE 1 TABLET (50 MG TOTAL) BY MOUTH AT BEDTIME AS NEEDED FOR SLEEP (CAN TAKE 1-2 TABS AS NEEDED ). 180 tablet 1   triamcinolone  cream (KENALOG ) 0.1 % Apply 1 Application topically 2 (two) times daily. 30 g 0   nitrofurantoin , macrocrystal-monohydrate, (MACROBID ) 100 MG capsule Take 1 capsule (100 mg total) by mouth 2 (two) times daily. (Patient not taking: Reported on 01/23/2024) 10 capsule 0   No current facility-administered medications for this visit.    Allergies:   Patient has no known allergies.    Social History:  The patient  reports that she quit smoking about 3 years ago. Her smoking use included cigarettes. She started smoking about 44 years ago. She has a 20.5 pack-year smoking history. She has never used smokeless tobacco. She reports that she does not drink alcohol and does not use drugs.   Family History:  The patient's family history includes Alcohol abuse in her brother and father; Cancer in her brother and paternal aunt; Diabetes in her brother, brother, and father; Early death in her brother, brother, brother, and father; Heart disease in her brother and father; Stroke in her brother and father.    ROS:  Please see the history of present illness.   Otherwise, review of systems are positive for none.   All other systems are reviewed and negative.    PHYSICAL EXAM: VS:  BP 108/64 (BP Location: Left Arm, Patient Position: Sitting, Cuff Size: Normal)   Pulse 69   Ht 5' 5 (1.651 m)   Wt 180 lb 2 oz (81.7 kg)   SpO2 99%   BMI 29.97 kg/m  , BMI Body mass index is 29.97 kg/m. GEN:  Well nourished, well developed, in no acute distress  HEENT: normal  Neck: no JVD, carotid bruits, or masses Cardiac: RRR; no rubs, or gallops,no edema .  1 /6 systolic murmur in the aortic area Respiratory:  clear to auscultation bilaterally, normal work of breathing GI: soft, nontender, nondistended, + BS MS: no deformity or atrophy  Skin: warm and dry, no rash Neuro:  Strength and sensation are intact Psych: euthymic mood, full affect   EKG:  EKG is ordered today. EKG showed normal sinus rhythm with low voltage.    Recent Labs: 05/16/2023: Hemoglobin 13.1; Platelets 324; TSH 0.793 11/26/2023: ALT 17; BUN 12; Creatinine, Ser 0.76; Potassium 4.0; Sodium 136    Lipid Panel    Component Value Date/Time   CHOL 125 11/26/2023 1144  TRIG 76 11/26/2023 1144   HDL 58 11/26/2023 1144   CHOLHDL 2.2 11/26/2023 1144   LDLCALC 52 11/26/2023 1144      Wt Readings from Last 3 Encounters:  01/23/24 180 lb 2 oz (81.7 kg)  01/01/24 178 lb (80.7 kg)  12/31/23 179 lb (81.2 kg)          08/24/2020    8:44 AM  PAD Screen  Previous PAD dx? No  Previous surgical procedure? No  Pain with walking? Yes  Subsides with rest? Yes  Feet/toe relief with dangling? Yes  Painful, non-healing ulcers? No  Extremities discolored? No      ASSESSMENT AND PLAN:  1.  Peripheral arterial disease: She has minimal calf claudication at the present time.  She has not been walking or exercising on a regular basis and I discussed with her the importance of daily exercise.  Continue low-dose aspirin  and cilostazol .   2.  Coronary artery disease involving native coronary arteries without angina: Heavily calcified LAD with mild nonobstructive disease on previous cardiac catheterization in 2023.  Continue medical therapy.  No convincing symptoms of angina at the present time.  3.  Previous tobacco use: She quit smoking last year with no relapse.  4.  Hyperlipidemia: I reviewed most recent lipid profile done  in August which showed an LDL of 52 which is at target.  Continue rosuvastatin  and ezetimibe .   Disposition:   FU with me in 12 months  Signed,  Deatrice Cage, MD  01/23/2024 11:08 AM    [facility]

## 2024-01-24 ENCOUNTER — Other Ambulatory Visit: Payer: Self-pay | Admitting: Nurse Practitioner

## 2024-01-24 DIAGNOSIS — E1165 Type 2 diabetes mellitus with hyperglycemia: Secondary | ICD-10-CM

## 2024-01-24 NOTE — Telephone Encounter (Signed)
 Requested Prescriptions  Refused Prescriptions Disp Refills   lisinopril  (ZESTRIL ) 2.5 MG tablet [Pharmacy Med Name: Lisinopril  2.5 MG Oral Tablet] 90 tablet 0    Sig: Take 1 tablet by mouth once daily     Cardiovascular:  ACE Inhibitors Passed - 01/24/2024  4:14 PM      Passed - Cr in normal range and within 180 days    Creatinine, Ser  Date Value Ref Range Status  11/26/2023 0.76 0.57 - 1.00 mg/dL Final         Passed - K in normal range and within 180 days    Potassium  Date Value Ref Range Status  11/26/2023 4.0 3.5 - 5.2 mmol/L Final         Passed - Patient is not pregnant      Passed - Last BP in normal range    BP Readings from Last 1 Encounters:  01/23/24 108/64         Passed - Valid encounter within last 6 months    Recent Outpatient Visits           3 weeks ago Acute cystitis without hematuria   [facility] Melvin Pao, NP   1 month ago Acute cystitis without hematuria   [facility] Melvin Pao, NP   1 month ago Uncontrolled type 2 diabetes mellitus with hyperglycemia [facility])   [facility] Melvin Pao, NP       Future Appointments             In 1 month Vaillancourt, Samantha, PA-C [facility]

## 2024-02-13 ENCOUNTER — Encounter: Payer: Self-pay | Admitting: Nurse Practitioner

## 2024-02-14 ENCOUNTER — Telehealth: Payer: Self-pay

## 2024-02-14 NOTE — Telephone Encounter (Signed)
 Gave [employer] a call to follow up on pt not receiving refill on Ozempic ,spoke with representative explain pt was due August 1, pt needs a refill reorder form in order for pt to received a refill, filled and faxed provider portion for provider to sign and date can be fax to [employer] or fax back to [phone].

## 2024-02-17 NOTE — Telephone Encounter (Signed)
 Received provider portion [employer] refill reorder form faxed to [employer] today.

## 2024-02-20 ENCOUNTER — Other Ambulatory Visit (HOSPITAL_COMMUNITY): Payer: Self-pay

## 2024-02-20 ENCOUNTER — Other Ambulatory Visit: Payer: Self-pay | Admitting: Medical Genetics

## 2024-02-20 DIAGNOSIS — Z006 Encounter for examination for normal comparison and control in clinical research program: Secondary | ICD-10-CM

## 2024-02-20 NOTE — Telephone Encounter (Signed)
 Received a letter from [employer] requesting provider portion does not have a date on application, fixed and faxed to [employer] today.

## 2024-03-05 DIAGNOSIS — H35373 Puckering of macula, bilateral: Secondary | ICD-10-CM | POA: Diagnosis not present

## 2024-03-05 DIAGNOSIS — H35341 Macular cyst, hole, or pseudohole, right eye: Secondary | ICD-10-CM | POA: Diagnosis not present

## 2024-03-05 DIAGNOSIS — E119 Type 2 diabetes mellitus without complications: Secondary | ICD-10-CM | POA: Diagnosis not present

## 2024-03-05 DIAGNOSIS — H43813 Vitreous degeneration, bilateral: Secondary | ICD-10-CM | POA: Diagnosis not present

## 2024-03-05 LAB — HM DIABETES EYE EXAM

## 2024-03-12 ENCOUNTER — Ambulatory Visit: Payer: Self-pay | Admitting: Physician Assistant

## 2024-03-16 ENCOUNTER — Telehealth: Payer: Self-pay

## 2024-03-16 NOTE — Progress Notes (Signed)
   03/16/2024  Patient ID: Tammy Guerra, female   DOB: 1950/05/18, 73 y.o.   MRN: [id number]  Clinic routed request from PCP office in regard to reorder for Ozempic  1mg  through [facility] [facility].  Reorder form was faxed at the end of October, but medication has not yet arrived.  Contacted [employer], and reorder form sent in was dated 02/17/23 instead of 2025.  Reorder form will need to be updated to correct date and resubmitted.  Tammy Guerra, PharmD, DPLA

## 2024-03-24 ENCOUNTER — Telehealth: Payer: Self-pay

## 2024-03-24 NOTE — Progress Notes (Signed)
   03/24/2024  Patient ID: Tammy Guerra, female   DOB: 04/12/1951, 73 y.o.   MRN: [id number]  Contacted [employer] to check on processing/shipping of Ozempic .  Ozempic  1mg  #4 pens shipped 12/1, tracking [phone].  This should arrive at [facility] by EOD tomorrow.  Telephone visit already scheduled with patient tomorrow to discuss options for obtaining Ozempic  moving forward, so I will notify her during this call.  [name], PharmD, DPLA

## 2024-03-25 ENCOUNTER — Other Ambulatory Visit: Payer: Self-pay

## 2024-03-25 NOTE — Progress Notes (Signed)
 03/25/2024 Name: Tammy Guerra MRN: [id number] DOB: 04-28-1950  Chief Complaint  Patient presents with   Medication Assistance   Tammy Guerra is a 73 y.o. year old female who presented for a telephone visit.  Subjective:  Care Team: Primary Care Provider: Melvin Pao, NP ; Next Scheduled Visit: 05/29/23  Medication Access/Adherence  Current Pharmacy:  [facility] [address]),  - [address] [place]) [place] [address] Phone: [phone] Fax: [phone]  Patient reports affordability concerns with their medications: Yes  Patient reports access/transportation concerns to their pharmacy: No  Patient reports adherence concerns with their medications:  No    Diabetes: Current medications: Ozempic  1mg  weekly, metformin  XR 500mg  2 tablets BID -Receives Ozempic  1mg  through [employer] currently but will not be able to after January based on program changes -4 boxes of Ozempic  1mg  shipped this week and should arrive at [facility] today or tomorrow- this along with what she has at home will get her through to almost April -Using Libre 3+ for CGM- sent email to link data to [facility] LibreView dashboard  Macrovascular and Microvascular Risk Reduction:  Statin? yes (rosuvastatin  20mg ); ACEi/ARB? yes (lisinopril  2.5mg ) Last urinary albumin/creatinine ratio:  Lab Results  Component Value Date   MICRALBCREAT <30 05/16/2023   MICRALBCREAT 30-300 (H) 07/01/2020   Last eye exam:  Lab Results  Component Value Date   HMDIABEYEEXA No Retinopathy 02/14/2023   Last foot exam: 11/26/2023 Tobacco Use:  Tobacco Use: Medium Risk (01/23/2024)   Patient History    Smoking Tobacco Use: Former    Smokeless Tobacco Use: Never    Passive Exposure: Not on file   Objective:  Lab Results  Component Value Date   HGBA1C 7.1 (H) 11/26/2023   Lab Results  Component Value Date   CREATININE 0.76 11/26/2023   BUN 12 11/26/2023   NA 136  11/26/2023   K 4.0 11/26/2023   CL 98 11/26/2023   CO2 24 11/26/2023   Lab Results  Component Value Date   CHOL 125 11/26/2023   HDL 58 11/26/2023   LDLCALC 52 11/26/2023   TRIG 76 11/26/2023   CHOLHDL 2.2 11/26/2023   Medications Reviewed Today     Reviewed by Deanna Channing LABOR, RPH (Pharmacist) on 03/25/24 at 1415  Med List Status: <None>   Medication Order Taking? Sig Documenting Provider Last Dose Status Informant  ALPRAZolam  (XANAX ) 0.25 MG tablet [id number]  Take 1 tablet (0.25 mg total) by mouth 2 (two) times daily as needed for anxiety. Melvin Pao, NP  Active   aspirin  EC 81 MG tablet [id number]  Take 81 mg by mouth daily. Swallow whole. [provider]  Active   cilostazol  (PLETAL ) 100 MG tablet [id number]  Take 1 tablet by mouth twice daily Darron Deatrice LABOR, MD  Active   Continuous Glucose Sensor (FREESTYLE LIBRE 3 PLUS SENSOR) [place] [id number]  CHANGE SENSOR EVERY 15 DAYS Melvin Pao, NP  Active   ezetimibe  (ZETIA ) 10 MG tablet [id number]  Take 1 tablet by mouth once daily Arida, Muhammad A, MD  Active   lisinopril  (ZESTRIL ) 2.5 MG tablet [id number]  Take 1 tablet by mouth once daily Melvin Pao, NP  Active   metFORMIN  (GLUCOPHAGE -XR) 500 MG 24 hr tablet [id number]  TAKE 2 TABLETS BY MOUTH WITH BREAKFAST AND 2 TABLETS AT BEDTIME Melvin Pao, NP  Active   nitrofurantoin , macrocrystal-monohydrate, (MACROBID ) 100 MG capsule [id number]  Take 1 capsule (100 mg total) by mouth 2 (two)  times daily.  Patient not taking: Reported on 01/23/2024   Melvin Pao, NP  Active   rosuvastatin  (CRESTOR ) 20 MG tablet [id number]  Take 1 tablet by mouth once daily Melvin Pao, NP  Active   Semaglutide , 1 MG/DOSE, (OZEMPIC , 1 MG/DOSE,) 4 MG/3ML SOPN [id number] Yes Inject 1 mg into the skin once a week. Melvin Pao, NP  Active            Med Note ZENA, Olufemi Mofield A   Tue Mar 24, 2024  4:41 PM) [facility] [facility]  traZODone  (DESYREL ) 50 MG tablet [id number]  TAKE 1 TABLET  (50 MG TOTAL) BY MOUTH AT BEDTIME AS NEEDED FOR SLEEP (CAN TAKE 1-2 TABS AS NEEDED ). Melvin Pao, NP  Active   triamcinolone  cream (KENALOG ) 0.1 % [id number]  Apply 1 Application topically 2 (two) times daily. Melvin Pao, NP  Active            Assessment/Plan:   Diabetes: -Currently uncontrolled; goal A1c <7%. Cardiorenal risk reduction is opportunities for improvement.. Blood pressure is at goal <130/80. LDL is at goal.  -Continue Ozempic  1mg  weekly at this time; consider increasing to 2mg  in the future to obtain A1c <7% -Continue metformin  XR 1000mg  BID -See PCP again 2/5 and will be due for A1c, CMP, UACR, and lipid panel -If UACR >30, consider addition of SGLT2 -Due for eye exam -Patient likely qualifies for LIS Medicare Extra Help based on [facility] and resources.  Telephone visit scheduled next Wednesday to complete application on line with patient.  If approved, copay for Ozempic  would be around $12 moving forward.  Follow Up Plan: 12/10  [name], PharmD, DPLA

## 2024-03-26 ENCOUNTER — Telehealth: Payer: Self-pay

## 2024-03-26 NOTE — Telephone Encounter (Signed)
 Called patient to inform that their shipment through PAP (Patient Assistance Program) has been received in office and is available for pickup Monday-Friday 8am - 5pm (lunch 12:15pm-12:45pm office closed).  Patient aware to receive they will need to sign and date the pick up paperwork at the front desk folder.   Company: [employer] Medication received: Ozempic  How many units: 4

## 2024-03-27 ENCOUNTER — Telehealth: Payer: Self-pay | Admitting: Nurse Practitioner

## 2024-03-27 NOTE — Telephone Encounter (Signed)
 Copied from CRM #[id number]. Topic: Clinical - Medication Question >> Mar 27, 2024 11:40 AM Wess RAMAN wrote: Reason for CRM: Patient stated her grandson, Brayden Moccia, will pick up her Semaglutide , 1 MG/DOSE, (OZEMPIC , 1 MG/DOSE,) 4 MG/3ML SOPN

## 2024-03-27 NOTE — Telephone Encounter (Signed)
 Patient or on behalf of patient has come into office today to pick up their patient assistance program medication. They have signed the form indicating they have received and CMA has given them their medication.   Given to patient as follows:  Company: [employer] Medication received: Ozempic  How many boxes: 4 boxes

## 2024-04-01 ENCOUNTER — Other Ambulatory Visit: Payer: Self-pay

## 2024-04-01 NOTE — Progress Notes (Signed)
° °  04/01/2024  Patient ID: Tammy Guerra, female   DOB: May 26, 1950, 73 y.o.   MRN: [id number]  Patient outreach to complete LIS Medicare Extra Help application.  Application has been submitted, and patient should receive a letter in the mail from [facility] [place] in 4-6 weeks with determination.  She currently has enough Ozempic  to last through March; but if approved, this will make copay for this medication on insurance $12.15 since she will no longer receive through [employer] P[employer].  Patient also received notification from insurance that they may no longer cover [place] since she does not use insulin.  She will continue to use as long as they will be covered and has enough on hand to get through the end of the year currently.  Telephone visit scheduled in 6 weeks to follow-up on management of diabetes and see if she has heard back from LIS application.  Tammy Guerra, PharmD, DPLA

## 2024-04-09 ENCOUNTER — Encounter: Payer: Self-pay | Admitting: Physician Assistant

## 2024-04-09 ENCOUNTER — Ambulatory Visit: Admitting: Physician Assistant

## 2024-04-09 VITALS — BP 143/75 | HR 81 | Ht 65.5 in | Wt 175.0 lb

## 2024-04-09 DIAGNOSIS — N3000 Acute cystitis without hematuria: Secondary | ICD-10-CM | POA: Diagnosis not present

## 2024-04-09 DIAGNOSIS — N3941 Urge incontinence: Secondary | ICD-10-CM

## 2024-04-09 DIAGNOSIS — N200 Calculus of kidney: Secondary | ICD-10-CM | POA: Diagnosis not present

## 2024-04-09 LAB — URINALYSIS, COMPLETE
Bilirubin, UA: NEGATIVE
Glucose, UA: NEGATIVE
Ketones, UA: NEGATIVE
Nitrite, UA: NEGATIVE
Protein,UA: NEGATIVE
Specific Gravity, UA: 1.01 (ref 1.005–1.030)
Urobilinogen, Ur: 0.2 mg/dL (ref 0.2–1.0)
pH, UA: 7 (ref 5.0–7.5)

## 2024-04-09 LAB — MICROSCOPIC EXAMINATION: WBC, UA: >30 /HPF — AB (ref 0–5)

## 2024-04-09 MED ORDER — SULFAMETHOXAZOLE-TRIMETHOPRIM 800-160 MG PO TABS: 1.0000 | ORAL_TABLET | Freq: Two times a day (BID) | ORAL | 0 refills | Status: AC

## 2024-04-09 MED ORDER — ESTRADIOL 0.01 % VA CREA: TOPICAL_CREAM | VAGINAL | 12 refills | Status: AC

## 2024-04-09 NOTE — Progress Notes (Signed)
 04/09/2024 3:53 PM   Tammy Guerra 1950-07-03 [id number]  CC: Chief Complaint  Patient presents with   Hematuria   HPI: Tammy Guerra is a 73 y.o. female with PMH diabetes, right renal cyst, and left nephrolithiasis who presents today for evaluation of possible UTI.   Today she reports about 2 weeks of dysuria, frequency, and nausea.  She denies fevers, flank pain, or gross hematuria.  She also describes chronic urinary incontinence, sometimes associated with urge but sometimes insensate.  She wears absorbent products.  She has noticed some bothersome vaginal dryness.  She recently started cranberry supplements for UTI prevention.  In-office UA today positive for trace intact blood and 3+ leukocytes; urine microscopy with >30 WBCs/HPF, 3-10 RBCs/HPF, and many bacteria.  PMH: Past Medical History:  Diagnosis Date   Anxiety    Arthritis    B12 deficiency anemia 11/13/2022   Cancer (HCC)    skin/CERVICAL CA   Complication of anesthesia    discomfort during first cataract   Diabetes mellitus without complication (HCC)    Emphysema lung (HCC) 07/01/2020   Emphysema lung (HCC) 07/01/2020   GERD (gastroesophageal reflux disease)    NO MEDS   History of kidney stones    STONES AND CYSTS   Kidney stone    Palpitations    Pancreatic cyst     Surgical History: Past Surgical History:  Procedure Laterality Date   ABDOMINAL HYSTERECTOMY  2004   CARDIAC CATHETERIZATION     CATARACT EXTRACTION W/PHACO Left 09/07/2014   Procedure: CATARACT EXTRACTION PHACO AND INTRAOCULAR LENS PLACEMENT (IOC);  Surgeon: Elsie Carmine, MD;  Location: [facility] ORS;  Service: Ophthalmology;  Laterality: Left;  US  01:03 AP% 27.7 CDE 17.55   CORONARY ANGIOPLASTY     EYE SURGERY     cataract   KNEE ARTHROSCOPY WITH MEDIAL MENISECTOMY Right 05/30/2017   Procedure: KNEE ARTHROSCOPY WITH MEDIAL AND LATERAL  MENISECTOMY;  Surgeon: Kathlynn Sharper, MD;  Location: [facility] ORS;  Service: Orthopedics;   Laterality: Right;   LEFT HEART CATH AND CORONARY ANGIOGRAPHY N/A 09/08/2021   Procedure: LEFT HEART CATH AND CORONARY ANGIOGRAPHY;  Surgeon: Darron Deatrice LABOR, MD;  Location: [facility];  Service: Cardiovascular;  Laterality: N/A;   SYNOVECTOMY Right 05/30/2017   Procedure: SYNOVECTOMY;  Surgeon: Kathlynn Sharper, MD;  Location: [facility] OR[facility];  Service: Orthopedics;  Laterality: Right;   TOTAL ABDOMINAL HYSTERECTOMY W/ BILATERAL SALPINGOOPHORECTOMY      Home Medications:  Allergies as of 04/09/2024   No Known Allergies      Medication List        Accurate as of April 09, 2024  3:53 PM. If you have any questions, ask your nurse or doctor.          ALPRAZolam  0.25 MG tablet Commonly known as: XANAX  Take 1 tablet (0.25 mg total) by mouth 2 (two) times daily as needed for anxiety.   aspirin  EC 81 MG tablet Take 81 mg by mouth daily. Swallow whole.   cilostazol  100 MG tablet Commonly known as: PLETAL  Take 1 tablet by mouth twice daily   ezetimibe  10 MG tablet Commonly known as: ZETIA  Take 1 tablet by mouth once daily   fluconazole  150 MG tablet Commonly known as: DIFLUCAN  Take 150 mg by mouth once.   FreeStyle Libre 3 Plus Sensor Misc CHANGE SENSOR EVERY 15 DAYS   lisinopril  2.5 MG tablet Commonly known as: ZESTRIL  Take 1 tablet by mouth once daily   metFORMIN  500 MG 24 hr tablet  Commonly known as: GLUCOPHAGE -XR TAKE 2 TABLETS BY MOUTH WITH BREAKFAST AND 2 TABLETS AT BEDTIME   Ozempic  (1 MG/DOSE) 4 MG/3ML Sopn Generic drug: Semaglutide  (1 MG/DOSE) Inject 1 mg into the skin once a week.   rosuvastatin  20 MG tablet Commonly known as: CRESTOR  Take 1 tablet by mouth once daily   traZODone  50 MG tablet Commonly known as: DESYREL  TAKE 1 TABLET (50 MG TOTAL) BY MOUTH AT BEDTIME AS NEEDED FOR SLEEP (CAN TAKE 1-2 TABS AS NEEDED ).   triamcinolone  cream 0.1 % Commonly known as: KENALOG  Apply 1 Application topically 2 (two) times daily.         Allergies:  Allergies[1]  Family History: Family History  Problem Relation Age of Onset   Diabetes Father    Alcohol abuse Father    Early death Father    Heart disease Father    Stroke Father    Diabetes Brother    Alcohol abuse Brother    Early death Brother    Heart disease Brother    Stroke Brother    Diabetes Brother    Cancer Brother    Early death Brother    Early death Brother    Cancer Paternal Aunt        Breast   Breast cancer Neg Hx     Social History:   reports that she quit smoking about 3 years ago. Her smoking use included cigarettes. She started smoking about 44 years ago. She has a 20.5 pack-year smoking history. She has never used smokeless tobacco. She reports that she does not drink alcohol and does not use drugs.  Physical Exam: BP (!) 143/75 Comment: said she was anxious for her appt denied symptoms  Pulse 81   Ht 5' 5.5 (1.664 m)   Wt 175 lb (79.4 kg)   BMI 28.68 kg/m   Constitutional:  Alert and oriented, no acute distress, nontoxic appearing HEENT: [place], AT Cardiovascular: No clubbing, cyanosis, or edema Respiratory: Normal respiratory effort, no increased work of breathing Skin: No rashes, bruises or suspicious lesions Neurologic: Grossly intact, no focal deficits, moving all 4 extremities Psychiatric: Normal mood and affect  Laboratory Data: Results for orders placed or performed in visit on 04/09/24  Microscopic Examination   Collection Time: 04/09/24  3:36 PM   Urine  Result Value Ref Range   WBC, UA >30 (A) 0 - 5 /hpf   RBC, Urine 3-10 (A) 0 - 2 /hpf   Epithelial Cells (non renal) 0-10 0 - 10 /hpf   Bacteria, UA Many (A) None seen/Few  Urinalysis, Complete   Collection Time: 04/09/24  3:36 PM  Result Value Ref Range   Specific Gravity, UA 1.010 1.005 - 1.030   pH, UA 7.0 5.0 - 7.5   Color, UA Yellow Yellow   Appearance Ur Slightly cloudy Clear   Leukocytes,UA 3+ (A) Negative   Protein,UA Negative Negative/Trace    Glucose, UA Negative Negative   Ketones, UA Negative Negative   RBC, UA Trace (A) Negative   Bilirubin, UA Negative Negative   Urobilinogen, Ur 0.2 0.2 - 1.0 mg/dL   Nitrite, UA Negative Negative   Microscopic Examination See below:    Assessment & Plan:   1. Acute cystitis without hematuria (Primary) UA appears grossly positive, will start empiric Bactrim  and send for culture for further evaluation. - Urinalysis, Complete - CULTURE, URINE COMPREHENSIVE - sulfamethoxazole -trimethoprim  (BACTRIM  DS) 800-160 MG tablet; Take 1 tablet by mouth 2 (two) times daily for 5 days.  Dispense:  10 tablet; Refill: 0  2. Urge incontinence Will start topical vaginal estrogen cream for her GSM picture.  We discussed anticipated benefits in terms of UTI prevention and urge incontinence.  She prefers to defer pharmacotherapy for now, which is reasonable.  May consider these in the future per clinical course. - estradiol  (ESTRACE ) 0.01 % CREA vaginal cream; Apply one pea-sized amount around the opening of the urethra daily for 2 weeks, then 3 times weekly moving forward.  Dispense: 42.5 g; Refill: 12  3. Nephrolithiasis Overdue for surveillance KUB, will see her back in 3 months with KUB prior - Abdomen 1 view (KUB); Future   Return in about 3 months (around 07/08/2024) for Follow-up with KUB prior.  Lucie Hones, PA-C  [facility] [place] [address] [place], [place] [address] [phone]     [1] No Known Allergies

## 2024-04-14 ENCOUNTER — Ambulatory Visit: Payer: Self-pay | Admitting: Urology

## 2024-04-14 ENCOUNTER — Other Ambulatory Visit: Payer: Self-pay | Admitting: Nurse Practitioner

## 2024-04-14 DIAGNOSIS — N3000 Acute cystitis without hematuria: Secondary | ICD-10-CM

## 2024-04-14 LAB — CULTURE, URINE COMPREHENSIVE

## 2024-04-18 ENCOUNTER — Other Ambulatory Visit: Payer: Self-pay | Admitting: Nurse Practitioner

## 2024-04-18 DIAGNOSIS — E1165 Type 2 diabetes mellitus with hyperglycemia: Secondary | ICD-10-CM

## 2024-04-29 MED ORDER — CEPHALEXIN 500 MG PO CAPS: 500.0000 mg | ORAL_CAPSULE | Freq: Two times a day (BID) | ORAL | 0 refills | Status: AC

## 2024-05-13 ENCOUNTER — Other Ambulatory Visit: Payer: Self-pay

## 2024-05-13 DIAGNOSIS — E118 Type 2 diabetes mellitus with unspecified complications: Secondary | ICD-10-CM

## 2024-05-13 NOTE — Progress Notes (Signed)
" ° °  05/13/2024 Name: Tammy Guerra MRN: [id number] DOB: 06-Mar-1951  Tammy Guerra is a 74 y.o. year old female who presented for a telephone follow-up visit.  Subjective:  Care Team: Primary Care Provider: Melvin Pao, NP ; Next Scheduled Visit: 05/29/23  Diabetes: Current medications: Ozempic  1mg  weekly, metformin  XR 500mg  2 tablets BID -Receives Ozempic  1mg  through [employer] P[employer] currently but will not be able to any longer based on program changes -Has enough Ozemic 1mg  on hand to last through April -She has been approved for LIS Medicare Extra Help, so copay for Ozempic  will be $12.65 moving forward. -Using Libre 3+ for CGM- sent email to link data to [facility] LibreView dashboard  Macrovascular and Microvascular Risk Reduction:  Statin? yes (rosuvastatin  20mg ); ACEi/ARB? yes (lisinopril  2.5mg ) Last urinary albumin/creatinine ratio:  Lab Results  Component Value Date   MICRALBCREAT <30 05/16/2023   MICRALBCREAT 30-300 (H) 07/01/2020   Last eye exam:  Lab Results  Component Value Date   HMDIABEYEEXA No Retinopathy 02/14/2023   Last foot exam: 11/26/2023 Tobacco Use:  Tobacco Use: Medium Risk (04/09/2024)   Patient History    Smoking Tobacco Use: Former    Smokeless Tobacco Use: Never    Passive Exposure: Not on file   Objective:  Lab Results  Component Value Date   HGBA1C 7.1 (H) 11/26/2023   Lab Results  Component Value Date   CREATININE 0.76 11/26/2023   BUN 12 11/26/2023   NA 136 11/26/2023   K 4.0 11/26/2023   CL 98 11/26/2023   CO2 24 11/26/2023   Lab Results  Component Value Date   CHOL 125 11/26/2023   HDL 58 11/26/2023   LDLCALC 52 11/26/2023   TRIG 76 11/26/2023   CHOLHDL 2.2 11/26/2023   Assessment/Plan:   Diabetes: -Currently uncontrolled; goal A1c <7%. Cardiorenal risk reduction is opportunities for improvement.. Blood pressure is usually at goal <130/80. LDL is at goal.  -Continue current regimen at this time -Sees PCP again 2/5 and will  be due for A1c, CMP, UACR, and lipid panel -Consider increasing Ozempic  to 2mg  weekly based on A1c results.  Patient open to dose increase.  If done, patient can use 2 injections of 1mg  Ozempic  on hand to not waste med.  When ordering Ozempic  at pharmacy, I recommend sending to 3 months at a time since patient's copay will be $12.65 for 1 or 3 month supply since approved for LIS. -If UACR >30,  could consider addition of SGLT2 -Due for eye exam  Tammy Guerra, PharmD, DPLA    "

## 2024-05-28 ENCOUNTER — Encounter: Payer: Self-pay | Admitting: Nurse Practitioner

## 2024-05-28 ENCOUNTER — Ambulatory Visit: Payer: Self-pay | Admitting: Nurse Practitioner

## 2024-05-28 ENCOUNTER — Ambulatory Visit (INDEPENDENT_AMBULATORY_CARE_PROVIDER_SITE_OTHER): Admitting: Nurse Practitioner

## 2024-05-28 VITALS — BP 119/76 | HR 68 | Temp 97.5°F | Ht 65.51 in | Wt 179.6 lb

## 2024-05-28 DIAGNOSIS — R8290 Unspecified abnormal findings in urine: Secondary | ICD-10-CM | POA: Diagnosis not present

## 2024-05-28 DIAGNOSIS — E118 Type 2 diabetes mellitus with unspecified complications: Secondary | ICD-10-CM

## 2024-05-28 DIAGNOSIS — J439 Emphysema, unspecified: Secondary | ICD-10-CM | POA: Diagnosis not present

## 2024-05-28 DIAGNOSIS — E1165 Type 2 diabetes mellitus with hyperglycemia: Secondary | ICD-10-CM

## 2024-05-28 DIAGNOSIS — I700 Atherosclerosis of aorta: Secondary | ICD-10-CM | POA: Diagnosis not present

## 2024-05-28 DIAGNOSIS — Z0000 Encounter for general adult medical examination without abnormal findings: Secondary | ICD-10-CM | POA: Diagnosis not present

## 2024-05-28 DIAGNOSIS — F411 Generalized anxiety disorder: Secondary | ICD-10-CM

## 2024-05-28 DIAGNOSIS — E119 Type 2 diabetes mellitus without complications: Secondary | ICD-10-CM | POA: Diagnosis not present

## 2024-05-28 DIAGNOSIS — R300 Dysuria: Secondary | ICD-10-CM

## 2024-05-28 DIAGNOSIS — Z7985 Long-term (current) use of injectable non-insulin antidiabetic drugs: Secondary | ICD-10-CM

## 2024-05-28 LAB — URINALYSIS, ROUTINE W REFLEX MICROSCOPIC
Bilirubin, UA: NEGATIVE
Glucose, UA: NEGATIVE
Ketones, UA: NEGATIVE
Nitrite, UA: POSITIVE — AB
Protein,UA: NEGATIVE
Specific Gravity, UA: 1.02 (ref 1.005–1.030)
Urobilinogen, Ur: 0.2 mg/dL (ref 0.2–1.0)
pH, UA: 5.5 (ref 5.0–7.5)

## 2024-05-28 LAB — MICROALBUMIN, URINE WAIVED
Creatinine, Urine Waived: 100 mg/dL (ref 10–300)
Microalb, Ur Waived: 30 mg/L — ABNORMAL HIGH (ref 0–19)
Microalb/Creat Ratio: <30 mg/g

## 2024-05-28 LAB — MICROSCOPIC EXAMINATION: WBC, UA: >30 /HPF — AB (ref 0–5)

## 2024-05-28 MED ORDER — TRAZODONE HCL 50 MG PO TABS: ORAL_TABLET | ORAL | 0 refills | Status: AC

## 2024-05-28 MED ORDER — METFORMIN HCL ER 500 MG PO TB24: ORAL_TABLET | ORAL | 1 refills | Status: AC

## 2024-05-28 MED ORDER — LISINOPRIL 2.5 MG PO TABS: 2.5000 mg | ORAL_TABLET | Freq: Every day | ORAL | 1 refills | Status: AC

## 2024-05-28 MED ORDER — SEMAGLUTIDE (2 MG/DOSE) 8 MG/3ML ~~LOC~~ SOPN: 2.0000 mg | PEN_INJECTOR | SUBCUTANEOUS | 1 refills | Status: AC

## 2024-05-28 MED ORDER — CIPROFLOXACIN HCL 500 MG PO TABS: 500.0000 mg | ORAL_TABLET | Freq: Two times a day (BID) | ORAL | 0 refills | Status: AC

## 2024-05-28 MED ORDER — FLUCONAZOLE 150 MG PO TABS: 150.0000 mg | ORAL_TABLET | Freq: Once | ORAL | 0 refills | Status: AC

## 2024-05-28 MED ORDER — ALPRAZOLAM 0.25 MG PO TABS: 0.2500 mg | ORAL_TABLET | Freq: Two times a day (BID) | ORAL | 1 refills | Status: AC | PRN

## 2024-05-28 MED ORDER — ROSUVASTATIN CALCIUM 20 MG PO TABS: 20.0000 mg | ORAL_TABLET | Freq: Every day | ORAL | 1 refills | Status: AC

## 2024-05-28 NOTE — Assessment & Plan Note (Signed)
 Chronic.  Controlled.  CT Lung screening up to date.  Return to clinic in 6 months for reevaluation.  Call sooner if concerns arise. Smoking cessation since September 2022.  Encouraged patient to continue with smoking cessation.

## 2024-05-28 NOTE — Assessment & Plan Note (Signed)
 Chronic.  Controlled.  Continue with current medication regimen.  Received extra assistance via Medicare.  Sugars are running 160s.   Doing well with Metformin  500mg  BID.  Last A1c was 7.1%. Increased Ozempic  to 2mg .  Can increase dose if needed in the future.  Labs ordered today.  Return to clinic in 6 months for reevaluation.  Call sooner if concerns arise.

## 2024-05-28 NOTE — Assessment & Plan Note (Signed)
 Chronic. Followed by Cardiology. Continue with Rosuvastatin daily and Zetia.  Labs ordered. Follow up in 6 months.  Call sooner if concerns arise.

## 2024-05-28 NOTE — Addendum Note (Signed)
 Addended by: MELVIN PAO on: 05/28/2024 04:25 PM   Modules accepted: Orders

## 2024-05-28 NOTE — Assessment & Plan Note (Signed)
 Chronic. Ongoing.  Rarely uses Xanax .  Refills sent today.  Last refill was in July.  30 pills and 1 refill last about 6 months.  PDMP checked.  UDS and controlled substance agreement updated today.  Follow up in 6 months for reevaluation.  Call sooner if concerns arise.

## 2024-05-28 NOTE — Progress Notes (Signed)
 "  BP 119/76 (BP Location: Left Arm, Patient Position: Sitting, Cuff Size: Large)   Pulse 68   Temp (!) 97.5 F (36.4 C) (Oral)   Ht 5' 5.51 (1.664 m)   Wt 179 lb 9.6 oz (81.5 kg)   SpO2 98%   BMI 29.42 kg/m    Subjective:    Patient ID: Tammy Guerra, female    DOB: 03-Nov-1950, 74 y.o.   MRN: [id number]  HPI: Tammy Guerra is a 74 y.o. female presenting on 05/28/2024 for comprehensive medical examination. Current medical complaints include:none  She currently lives with: Menopausal Symptoms: no  DIABETES On 1mg  of Ozempic .  She needs the prescription sent to [facility].  She will no longer be receiving PAP.  She would like to increase her dose of Ozempic .  Hypoglycemic episodes:no Polydipsia/polyuria: yes Visual disturbance: no Chest pain: no Paresthesias: no Glucose Monitoring: no             Accucheck frequency: continuous             Fasting glucose: 160             Post prandial:             Evening:             Before meals: Taking Insulin?: no             Long acting insulin:             Short acting insulin: Blood Pressure Monitoring: a few times a month Retinal Examination: Up to Date- scheduled with [facility] Foot Exam: Up to Date Diabetic Education: Not Completed Pneumovax: Not up to Date Influenza: Not up to Date Aspirin : no   COPD Doing well.  Denies concerns at visit today.   COPD status: controlled Satisfied with current treatment?: no Oxygen use: no Dyspnea frequency: yes Cough frequency:  Rescue inhaler frequency:   Limitation of activity: no Productive cough:  Last Spirometry:  Pneumovax: Not up to Date Influenza: Not up to Date  URINARY SYMPTOMS Patient was seen by Urology recently and started antibiotics.  Once she finished the antibiotics it came right back.  Feels like it has something to do with her kidney stones.  Dysuria: yes Urinary frequency: yes Urgency: yes Small volume voids: yes Symptom severity: no Urinary  incontinence: yes Foul odor: yes Hematuria: no Abdominal pain: yes Back pain: no Suprapubic pain/pressure: yes Flank pain: no Fever:  no Vomiting: no Relief with cranberry juice: no Relief with pyridium: yes Status: better/worse/stable Previous urinary tract infection: yes Recurrent urinary tract infection: yes Sexual activity: No sexually active/monogomous/practicing safe sex History of sexually transmitted disease: no Penile discharge: no Treatments attempted: antibiotics and increasing fluids    Denies HA, CP, dizziness, palpitations, visual changes, and lower extremity swelling.    ANXIETY Doing well.  Feels about the same.  Rarely uses it but does like to have it on hand.  Is due for a refill.    Depression Screen done today and results listed below:     05/28/2024    8:05 AM 11/26/2023   11:28 AM 08/20/2023   11:26 AM 05/16/2023   11:10 AM 03/01/2023    1:18 PM  Depression screen PHQ 2/9  Decreased Interest 1 1 0 0 0  Down, Depressed, Hopeless 0 1 1 0 0  PHQ - 2 Score 1 2 1  0 0  Altered sleeping 0 1 0 2 0  Tired, decreased energy 3 2 1  2 3  Change in appetite 0 0 0 0 0  Feeling bad or failure about yourself  1 0 0 0 0  Trouble concentrating 0 1 0 0 1  Moving slowly or fidgety/restless 0 0 0 0 0  Suicidal thoughts 0 0 0 0 0  PHQ-9 Score 5 6  2  4  4    Difficult doing work/chores Not difficult at all Not difficult at all Not difficult at all  Not difficult at all     Data saved with a previous flowsheet row definition    The patient does not have a history of falls. I did complete a risk assessment for falls. A plan of care for falls was documented.   Past Medical History:  Past Medical History:  Diagnosis Date   Anxiety    Arthritis    B12 deficiency anemia 11/13/2022   Cancer (HCC)    skin/CERVICAL CA   Complication of anesthesia    discomfort during first cataract   Diabetes mellitus without complication (HCC)    Emphysema lung (HCC) 07/01/2020    Emphysema lung (HCC) 07/01/2020   GERD (gastroesophageal reflux disease)    NO MEDS   History of kidney stones    STONES AND CYSTS   Kidney stone    Palpitations    Pancreatic cyst     Surgical History:  Past Surgical History:  Procedure Laterality Date   ABDOMINAL HYSTERECTOMY  2004   CARDIAC CATHETERIZATION     CATARACT EXTRACTION W/PHACO Left 09/07/2014   Procedure: CATARACT EXTRACTION PHACO AND INTRAOCULAR LENS PLACEMENT (IOC);  Surgeon: Elsie Carmine, MD;  Location: [facility] ORS;  Service: Ophthalmology;  Laterality: Left;  US  01:03 AP% 27.7 CDE 17.55   CORONARY ANGIOPLASTY     EYE SURGERY     cataract   KNEE ARTHROSCOPY WITH MEDIAL MENISECTOMY Right 05/30/2017   Procedure: KNEE ARTHROSCOPY WITH MEDIAL AND LATERAL  MENISECTOMY;  Surgeon: Kathlynn Sharper, MD;  Location: [facility] ORS;  Service: Orthopedics;  Laterality: Right;   LEFT HEART CATH AND CORONARY ANGIOGRAPHY N/A 09/08/2021   Procedure: LEFT HEART CATH AND CORONARY ANGIOGRAPHY;  Surgeon: Darron Deatrice LABOR, MD;  Location: [facility] INVASIVE CV LAB;  Service: Cardiovascular;  Laterality: N/A;   SYNOVECTOMY Right 05/30/2017   Procedure: SYNOVECTOMY;  Surgeon: Kathlynn Sharper, MD;  Location: [facility] ORS;  Service: Orthopedics;  Laterality: Right;   TOTAL ABDOMINAL HYSTERECTOMY W/ BILATERAL SALPINGOOPHORECTOMY      Medications:  Current Outpatient Medications on File Prior to Visit  Medication Sig   aspirin  EC 81 MG tablet Take 81 mg by mouth daily. Swallow whole.   cilostazol  (PLETAL ) 100 MG tablet Take 1 tablet by mouth twice daily   Continuous Glucose Sensor (FREESTYLE LIBRE 3 PLUS SENSOR) MISC CHANGE SENSOR EVERY 15 DAYS   estradiol  (ESTRACE ) 0.01 % CREA vaginal cream Apply one pea-sized amount around the opening of the urethra daily for 2 weeks, then 3 times weekly moving forward.   ezetimibe  (ZETIA ) 10 MG tablet Take 1 tablet by mouth once daily   triamcinolone  cream (KENALOG ) 0.1 % Apply 1 Application topically 2 (two) times  daily.   No current facility-administered medications on file prior to visit.    Allergies:  No Known Allergies  Social History:  Social History   Socioeconomic History   Marital status: Widowed    Spouse name: Not on file   Number of children: 2   Years of education: Not on file   Highest education level: 12th grade  Occupational History  Occupation: retired  Tobacco Use   Smoking status: Former    Current packs/day: 0.00    Average packs/day: 0.5 packs/day for 41.0 years (20.5 ttl pk-yrs)    Types: Cigarettes    Start date: 12/20/1979    Quit date: 12/19/2020    Years since quitting: 3.4   Smokeless tobacco: Never   Tobacco comments:    15 CIG DAILY  Vaping Use   Vaping status: Never Used  Substance and Sexual Activity   Alcohol use: No   Drug use: No   Sexual activity: Not Currently    Birth control/protection: Surgical  Other Topics Concern   Not on file  Social History Narrative   Lives alone.  Diane, daughter at bedside.   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   Social Drivers of Health   Tobacco Use: Medium Risk (05/28/2024)   Patient History    Smoking Tobacco Use: Former    Smokeless Tobacco Use: Never    Passive Exposure: Not on file  Financial Resource Strain: Medium Risk (05/27/2024)   Overall Financial Resource Strain (CARDIA)    Difficulty of Paying Living Expenses: Somewhat hard  Food Insecurity: Food Insecurity Present (05/27/2024)   Epic    Worried About Programme Researcher, Broadcasting/film/video in the Last Year: Often true    Ran Out of Food in the Last Year: Sometimes true  Transportation Needs: No Transportation Needs (05/27/2024)   Epic    Lack of Transportation (Medical): No    Lack of Transportation (Non-Medical): No  Physical Activity: Inactive (05/27/2024)   Exercise Vital Sign    Days of Exercise per Week: 0 days    Minutes of Exercise per Session: Not on file  Stress: Stress Concern Present (05/27/2024)   [employer] of Occupational Health -  Occupational Stress Questionnaire    Feeling of Stress: Very much  Social Connections: Moderately Isolated (05/27/2024)   Social Connection and Isolation Panel    Frequency of Communication with Friends and Family: More than three times a week    Frequency of Social Gatherings with Friends and Family: More than three times a week    Attends Religious Services: More than 4 times per year    Active Member of [employer] or Organizations: No    Attends Banker Meetings: Not on file    Marital Status: Widowed  Intimate Partner Violence: Not At Risk (08/20/2023)   Humiliation, Afraid, Rape, and Kick questionnaire    Fear of Current or Ex-Partner: No    Emotionally Abused: No    Physically Abused: No    Sexually Abused: No  Depression (PHQ2-9): Medium Risk (05/28/2024)   Depression (PHQ2-9)    PHQ-2 Score: 5  Alcohol Screen: Low Risk (08/20/2023)   Alcohol Screen    Last Alcohol Screening Score (AUDIT): 0  Housing: Low Risk (05/27/2024)   Epic    Unable to Pay for Housing in the Last Year: No    Number of Times Moved in the Last Year: 0    Homeless in the Last Year: No  Utilities: Not At Risk (08/20/2023)   [employer] Utilities    Threatened with loss of utilities: No  Health Literacy: Adequate Health Literacy (08/20/2023)   B1300 Health Literacy    Frequency of need for help with medical instructions: Never   Social History   Tobacco Use  Smoking Status Former   Current packs/day: 0.00   Average packs/day: 0.5 packs/day for 41.0 years (20.5 ttl pk-yrs)   Types:  Cigarettes   Start date: 12/20/1979   Quit date: 12/19/2020   Years since quitting: 3.4  Smokeless Tobacco Never  Tobacco Comments   15 CIG DAILY   Social History   Substance and Sexual Activity  Alcohol Use No    Family History:  Family History  Problem Relation Age of Onset   Diabetes Father    Alcohol abuse Father    Early death Father    Heart disease Father    Stroke Father    Diabetes Brother    Alcohol  abuse Brother    Early death Brother    Heart disease Brother    Stroke Brother    Diabetes Brother    Cancer Brother    Early death Brother    Early death Brother    Cancer Paternal Aunt        Breast   Breast cancer Neg Hx     Past medical history, surgical history, medications, allergies, family history and social history reviewed with patient today and changes made to appropriate areas of the chart.   Review of Systems  HENT:         Denies vision changes.  Eyes:  Negative for blurred vision and double vision.  Respiratory:  Negative for shortness of breath.   Cardiovascular:  Negative for chest pain, palpitations and leg swelling.  Neurological:  Negative for dizziness, tingling and headaches.  Endo/Heme/Allergies:  Negative for polydipsia.       Denies Polyuria  Psychiatric/Behavioral:  The patient is nervous/anxious.    All other ROS negative except what is listed above and in the HPI.      Objective:    BP 119/76 (BP Location: Left Arm, Patient Position: Sitting, Cuff Size: Large)   Pulse 68   Temp (!) 97.5 F (36.4 C) (Oral)   Ht 5' 5.51 (1.664 m)   Wt 179 lb 9.6 oz (81.5 kg)   SpO2 98%   BMI 29.42 kg/m   Wt Readings from Last 3 Encounters:  05/28/24 179 lb 9.6 oz (81.5 kg)  04/09/24 175 lb (79.4 kg)  01/23/24 180 lb 2 oz (81.7 kg)    Physical Exam Vitals and nursing note reviewed.  Constitutional:      General: She is awake. She is not in acute distress.    Appearance: Normal appearance. She is well-developed. She is not ill-appearing.  HENT:     Head: Normocephalic and atraumatic.     Right Ear: Hearing, tympanic membrane, ear canal and external ear normal. No drainage.     Left Ear: Hearing, tympanic membrane, ear canal and external ear normal. No drainage.     Nose: Nose normal.     Right Sinus: No maxillary sinus tenderness or frontal sinus tenderness.     Left Sinus: No maxillary sinus tenderness or frontal sinus tenderness.     Mouth/Throat:      Mouth: Mucous membranes are moist.     Pharynx: Oropharynx is clear. Uvula midline. No pharyngeal swelling, oropharyngeal exudate or posterior oropharyngeal erythema.  Eyes:     General: Lids are normal.        Right eye: No discharge.        Left eye: No discharge.     Extraocular Movements: Extraocular movements intact.     Conjunctiva/sclera: Conjunctivae normal.     Pupils: Pupils are equal, round, and reactive to light.     Visual Fields: Right eye visual fields normal and left eye visual fields normal.  Neck:  Thyroid: No thyromegaly.     Vascular: No carotid bruit.     Trachea: Trachea normal.  Cardiovascular:     Rate and Rhythm: Normal rate and regular rhythm.     Heart sounds: Normal heart sounds. No murmur heard.    No gallop.  Pulmonary:     Effort: Pulmonary effort is normal. No accessory muscle usage or respiratory distress.     Breath sounds: Normal breath sounds.  Chest:  Breasts:    Right: Normal.     Left: Normal.  Abdominal:     General: Bowel sounds are normal.     Palpations: Abdomen is soft. There is no hepatomegaly or splenomegaly.     Tenderness: There is no abdominal tenderness.  Musculoskeletal:        General: Normal range of motion.     Cervical back: Normal range of motion and neck supple.     Right lower leg: No edema.     Left lower leg: No edema.  Lymphadenopathy:     Head:     Right side of head: No submental, submandibular, tonsillar, preauricular or posterior auricular adenopathy.     Left side of head: No submental, submandibular, tonsillar, preauricular or posterior auricular adenopathy.     Cervical: No cervical adenopathy.     Upper Body:     Right upper body: No supraclavicular, axillary or pectoral adenopathy.     Left upper body: No supraclavicular, axillary or pectoral adenopathy.  Skin:    General: Skin is warm and dry.     Capillary Refill: Capillary refill takes less than 2 seconds.     Findings: No rash.   Neurological:     Mental Status: She is alert and oriented to person, place, and time.     Gait: Gait is intact.  Psychiatric:        Attention and Perception: Attention normal.        Mood and Affect: Mood normal.        Speech: Speech normal.        Behavior: Behavior normal. Behavior is cooperative.        Thought Content: Thought content normal.        Judgment: Judgment normal.     Results for orders placed or performed in visit on 04/09/24  Microscopic Examination   Collection Time: 04/09/24  3:36 PM   Urine  Result Value Ref Range   WBC, UA >30 (A) 0 - 5 /hpf   RBC, Urine 3-10 (A) 0 - 2 /hpf   Epithelial Cells (non renal) 0-10 0 - 10 /hpf   Bacteria, UA Many (A) None seen/Few  Urinalysis, Complete   Collection Time: 04/09/24  3:36 PM  Result Value Ref Range   Specific Gravity, UA 1.010 1.005 - 1.030   pH, UA 7.0 5.0 - 7.5   Color, UA Yellow Yellow   Appearance Ur Slightly cloudy Clear   Leukocytes,UA 3+ (A) Negative   Protein,UA Negative Negative/Trace   Glucose, UA Negative Negative   Ketones, UA Negative Negative   RBC, UA Trace (A) Negative   Bilirubin, UA Negative Negative   Urobilinogen, Ur 0.2 0.2 - 1.0 mg/dL   Nitrite, UA Negative Negative   Microscopic Examination See below:   CULTURE, URINE COMPREHENSIVE   Collection Time: 04/09/24  4:00 PM   Specimen: Urine   UR  Result Value Ref Range   Urine Culture, Comprehensive Final report (A)    Organism ID, Bacteria Escherichia coli (A)  ANTIMICROBIAL SUSCEPTIBILITY Comment       Assessment & Plan:   Problem List Items Addressed This Visit       Cardiovascular and Mediastinum   Aortic atherosclerosis   Chronic. Followed by Cardiology. Continue with Rosuvastatin  daily and Zetia .  Labs ordered. Follow up in 6 months.  Call sooner if concerns arise.      Relevant Medications   lisinopril  (ZESTRIL ) 2.5 MG tablet   rosuvastatin  (CRESTOR ) 20 MG tablet   Other Relevant Orders   Lipid panel      Respiratory   Emphysema lung (HCC)   Chronic.  Controlled.  CT Lung screening up to date.  Return to clinic in 6 months for reevaluation.  Call sooner if concerns arise. Smoking cessation since September 2022.  Encouraged patient to continue with smoking cessation.        Endocrine   Diabetes mellitus type 2 with complications (HCC)   Chronic.  Controlled.  Continue with current medication regimen.  Received extra assistance via Medicare.  Sugars are running 160s.   Doing well with Metformin  500mg  BID.  Last A1c was 7.1%. Increased Ozempic  to 2mg .  Can increase dose if needed in the future.  Labs ordered today.  Return to clinic in 6 months for reevaluation.  Call sooner if concerns arise.       Relevant Medications   Semaglutide , 2 MG/DOSE, 8 MG/3ML SOPN   lisinopril  (ZESTRIL ) 2.5 MG tablet   metFORMIN  (GLUCOPHAGE -XR) 500 MG 24 hr tablet   rosuvastatin  (CRESTOR ) 20 MG tablet   Diabetes mellitus treated with injections of non-insulin medication (HCC)   Relevant Medications   Semaglutide , 2 MG/DOSE, 8 MG/3ML SOPN   lisinopril  (ZESTRIL ) 2.5 MG tablet   metFORMIN  (GLUCOPHAGE -XR) 500 MG 24 hr tablet   rosuvastatin  (CRESTOR ) 20 MG tablet   Other Relevant Orders   Hemoglobin A1c   Microalbumin, Urine Waived     Other   Generalized anxiety disorder (Chronic)   Chronic. Ongoing.  Rarely uses Xanax .  Refills sent today.  Last refill was in July.  30 pills and 1 refill last about 6 months.  PDMP checked.  UDS and controlled substance agreement updated today.  Follow up in 6 months for reevaluation.  Call sooner if concerns arise.       Relevant Medications   ALPRAZolam  (XANAX ) 0.25 MG tablet   traZODone  (DESYREL ) 50 MG tablet   Other Relevant Orders   [id number] 11+Oxyco+Alc+Crt-Bund   Other Visit Diagnoses       Annual physical exam    -  Primary   Health maintenance reviewed during visit today.  Labs ordered.  Vaccines reviewed.  Mammogram and Colonoscopy up to date.   Relevant Orders    Hemoglobin A1c   Microalbumin, Urine Waived   CBC with Differential/Platelet   Comprehensive metabolic panel with GFR   Lipid panel   TSH     Uncontrolled type 2 diabetes mellitus with hyperglycemia (HCC)       Relevant Medications   Semaglutide , 2 MG/DOSE, 8 MG/3ML SOPN   lisinopril  (ZESTRIL ) 2.5 MG tablet   metFORMIN  (GLUCOPHAGE -XR) 500 MG 24 hr tablet   rosuvastatin  (CRESTOR ) 20 MG tablet     Dysuria       UA obtained during visit today.  Will make recommendations once results are back.   Relevant Orders   Urinalysis, Routine w reflex microscopic        Follow up plan: No follow-ups on file.   LABORATORY TESTING:  - Pap smear:  not applicable  IMMUNIZATIONS:   - Tdap: Tetanus vaccination status reviewed: Medicare. - Influenza: Refused - Pneumovax: Up to date - Prevnar: Up to date - COVID: Up to date - HPV: Not applicable - Shingrix vaccine: Discussed at visit  SCREENING: -Mammogram: Ordered today  - Colonoscopy: Up to date  - Bone Density: up to date -Hearing Test: Not applicable  -Spirometry: Not applicable   PATIENT COUNSELING:   Advised to take 1 mg of folate supplement per day if capable of pregnancy.   Sexuality: Discussed sexually transmitted diseases, partner selection, use of condoms, avoidance of unintended pregnancy  and contraceptive alternatives.   Advised to avoid cigarette smoking.  I discussed with the patient that most people either abstain from alcohol or drink within safe limits (<=14/week and <=4 drinks/occasion for males, <=7/weeks and <= 3 drinks/occasion for females) and that the risk for alcohol disorders and other health effects rises proportionally with the number of drinks per week and how often a drinker exceeds daily limits.  Discussed cessation/primary prevention of drug use and availability of treatment for abuse.   Diet: Encouraged to adjust caloric intake to maintain  or achieve ideal body weight, to reduce intake of dietary  saturated fat and total fat, to limit sodium intake by avoiding high sodium foods and not adding table salt, and to maintain adequate dietary potassium and calcium  preferably from fresh fruits, vegetables, and low-fat dairy products.    stressed the importance of regular exercise  Injury prevention: Discussed safety belts, safety helmets, smoke detector, smoking near bedding or upholstery.   Dental health: Discussed importance of regular tooth brushing, flossing, and dental visits.    NEXT PREVENTATIVE PHYSICAL DUE IN 1 YEAR. No follow-ups on file.          "

## 2024-05-29 ENCOUNTER — Encounter: Payer: Self-pay | Admitting: Nurse Practitioner

## 2024-05-29 LAB — COMPREHENSIVE METABOLIC PANEL WITH GFR
ALT: 19 [IU]/L (ref 0–32)
AST: 17 [IU]/L (ref 0–40)
Albumin: 4.6 g/dL (ref 3.8–4.8)
Alkaline Phosphatase: 77 [IU]/L (ref 49–135)
BUN/Creatinine Ratio: 22 (ref 12–28)
BUN: 13 mg/dL (ref 8–27)
Bilirubin Total: 0.4 mg/dL (ref 0.0–1.2)
CO2: 22 mmol/L (ref 20–29)
Calcium: 10.2 mg/dL (ref 8.7–10.3)
Chloride: 101 mmol/L (ref 96–106)
Creatinine, Ser: 0.6 mg/dL (ref 0.57–1.00)
Globulin, Total: 1.8 g/dL (ref 1.5–4.5)
Glucose: 115 mg/dL — ABNORMAL HIGH (ref 70–99)
Potassium: 4.6 mmol/L (ref 3.5–5.2)
Sodium: 140 mmol/L (ref 134–144)
Total Protein: 6.4 g/dL (ref 6.0–8.5)
eGFR: 95 mL/min/{1.73_m2}

## 2024-05-29 LAB — DRUG SCREEN 764883 11+OXYCO+ALC+CRT-BUND
Amphetamines, Urine: NEGATIVE ng/mL
BENZODIAZ UR QL: NEGATIVE ng/mL
Barbiturate screen, urine: NEGATIVE ng/mL
Cannabinoid Quant, Ur: NEGATIVE ng/mL
Cocaine (Metab.): NEGATIVE ng/mL
Creatinine, Urine: 73.2 mg/dL (ref 20.0–300.0)
Ethanol, Urine: NEGATIVE %
Meperidine: NEGATIVE ng/mL
Methadone Screen, Urine: NEGATIVE ng/mL
Nitrite Urine, Quantitative: NEGATIVE ug/mL
OPIATE SCREEN URINE: NEGATIVE ng/mL
Oxycodone/Oxymorphone, Urine: NEGATIVE ng/mL
PCP Quant, Ur: NEGATIVE ng/mL
Propoxyphene: NEGATIVE ng/mL
TRAMADOL: NEGATIVE ng/mL
pH, Urine: 5.1 (ref 4.5–8.9)

## 2024-05-29 LAB — CBC WITH DIFFERENTIAL/PLATELET
Basophils Absolute: 0.1 10*3/uL (ref 0.0–0.2)
Basos: 1 %
EOS (ABSOLUTE): 0.1 10*3/uL (ref 0.0–0.4)
Eos: 1 %
Hematocrit: 41.7 % (ref 34.0–46.6)
Hemoglobin: 13.3 g/dL (ref 11.1–15.9)
Immature Grans (Abs): 0 10*3/uL (ref 0.0–0.1)
Immature Granulocytes: 0 %
Lymphocytes Absolute: 2.2 10*3/uL (ref 0.7–3.1)
Lymphs: 25 %
MCH: 30.4 pg (ref 26.6–33.0)
MCHC: 31.9 g/dL (ref 31.5–35.7)
MCV: 95 fL (ref 79–97)
Monocytes Absolute: 0.6 10*3/uL (ref 0.1–0.9)
Monocytes: 7 %
Neutrophils Absolute: 5.9 10*3/uL (ref 1.4–7.0)
Neutrophils: 66 %
Platelets: 347 10*3/uL (ref 150–450)
RBC: 4.38 x10E6/uL (ref 3.77–5.28)
RDW: 12 % (ref 11.7–15.4)
WBC: 9 10*3/uL (ref 3.4–10.8)

## 2024-05-29 LAB — LIPID PANEL
Chol/HDL Ratio: 2.2 ratio (ref 0.0–4.4)
Cholesterol, Total: 119 mg/dL (ref 100–199)
HDL: 53 mg/dL
LDL Chol Calc (NIH): 50 mg/dL (ref 0–99)
Triglycerides: 82 mg/dL (ref 0–149)
VLDL Cholesterol Cal: 16 mg/dL (ref 5–40)

## 2024-05-29 LAB — HEMOGLOBIN A1C
Est. average glucose Bld gHb Est-mCnc: 154 mg/dL
Hgb A1c MFr Bld: 7 % — ABNORMAL HIGH (ref 4.8–5.6)

## 2024-05-29 LAB — TSH: TSH: 0.954 u[IU]/mL (ref 0.450–4.500)

## 2024-07-08 ENCOUNTER — Ambulatory Visit: Admitting: Physician Assistant

## 2024-07-09 ENCOUNTER — Ambulatory Visit: Admitting: Physician Assistant

## 2024-08-27 ENCOUNTER — Ambulatory Visit: Admitting: Nurse Practitioner

## 2024-09-01 ENCOUNTER — Ambulatory Visit
